# Patient Record
Sex: Female | Born: 1975
Health system: Southern US, Community
[De-identification: ages and names within clinical notes are randomized; demographics above are authoritative.]

## PROBLEM LIST (undated history)

## (undated) DIAGNOSIS — IMO0001 Reserved for inherently not codable concepts without codable children: Secondary | ICD-10-CM

## (undated) DIAGNOSIS — A749 Chlamydial infection, unspecified: Secondary | ICD-10-CM

## (undated) DIAGNOSIS — B009 Herpesviral infection, unspecified: Secondary | ICD-10-CM

## (undated) DIAGNOSIS — IMO0002 Reserved for concepts with insufficient information to code with codable children: Secondary | ICD-10-CM

## (undated) DIAGNOSIS — B002 Herpesviral gingivostomatitis and pharyngotonsillitis: Secondary | ICD-10-CM

## (undated) DIAGNOSIS — Z9289 Personal history of other medical treatment: Secondary | ICD-10-CM

## (undated) DIAGNOSIS — M1990 Unspecified osteoarthritis, unspecified site: Secondary | ICD-10-CM

## (undated) DIAGNOSIS — D1803 Hemangioma of intra-abdominal structures: Secondary | ICD-10-CM

## (undated) DIAGNOSIS — E669 Obesity, unspecified: Secondary | ICD-10-CM

## (undated) DIAGNOSIS — R7301 Impaired fasting glucose: Secondary | ICD-10-CM

## (undated) DIAGNOSIS — I10 Essential (primary) hypertension: Secondary | ICD-10-CM

## (undated) DIAGNOSIS — M549 Dorsalgia, unspecified: Secondary | ICD-10-CM

## (undated) HISTORY — DX: Hemangioma of intra-abdominal structures: D18.03

## (undated) HISTORY — DX: Obesity, unspecified: E66.9

## (undated) HISTORY — DX: Chlamydial infection, unspecified: A74.9

## (undated) HISTORY — DX: Dorsalgia, unspecified: M54.9

## (undated) HISTORY — DX: Personal history of other medical treatment: Z92.89

## (undated) HISTORY — DX: Reserved for inherently not codable concepts without codable children: IMO0001

## (undated) HISTORY — DX: Herpesviral gingivostomatitis and pharyngotonsillitis: B00.2

## (undated) HISTORY — DX: Essential (primary) hypertension: I10

## (undated) HISTORY — DX: Unspecified osteoarthritis, unspecified site: M19.90

## (undated) HISTORY — DX: Herpesviral infection, unspecified: B00.9

## (undated) HISTORY — DX: Impaired fasting glucose: R73.01

---

## 1998-03-20 HISTORY — PX: KNEE SURGERY: SHX244

## 2003-03-21 DIAGNOSIS — R87619 Unspecified abnormal cytological findings in specimens from cervix uteri: Secondary | ICD-10-CM

## 2003-03-21 DIAGNOSIS — IMO0002 Reserved for concepts with insufficient information to code with codable children: Secondary | ICD-10-CM

## 2003-03-21 HISTORY — DX: Reserved for concepts with insufficient information to code with codable children: IMO0002

## 2003-03-21 HISTORY — DX: Unspecified abnormal cytological findings in specimens from cervix uteri: R87.619

## 2003-05-19 HISTORY — PX: COLPOSCOPY: SHX161

## 2004-08-05 ENCOUNTER — Other Ambulatory Visit: Admission: RE | Admit: 2004-08-05 | Discharge: 2004-08-05 | Payer: Self-pay | Admitting: Family Medicine

## 2005-08-24 ENCOUNTER — Ambulatory Visit: Payer: Self-pay | Admitting: Family Medicine

## 2005-08-24 ENCOUNTER — Other Ambulatory Visit: Admission: RE | Admit: 2005-08-24 | Discharge: 2005-08-24 | Payer: Self-pay | Admitting: Family Medicine

## 2005-09-28 ENCOUNTER — Ambulatory Visit: Payer: Self-pay | Admitting: Family Medicine

## 2005-11-14 ENCOUNTER — Ambulatory Visit: Payer: Self-pay | Admitting: Family Medicine

## 2006-03-20 DIAGNOSIS — D1803 Hemangioma of intra-abdominal structures: Secondary | ICD-10-CM

## 2006-03-20 HISTORY — DX: Hemangioma of intra-abdominal structures: D18.03

## 2006-06-19 ENCOUNTER — Ambulatory Visit: Payer: Self-pay | Admitting: Family Medicine

## 2006-07-18 ENCOUNTER — Ambulatory Visit: Payer: Self-pay | Admitting: Family Medicine

## 2006-07-19 ENCOUNTER — Ambulatory Visit (HOSPITAL_COMMUNITY): Admission: RE | Admit: 2006-07-19 | Discharge: 2006-07-19 | Payer: Self-pay | Admitting: Family Medicine

## 2006-12-28 ENCOUNTER — Ambulatory Visit: Payer: Self-pay | Admitting: Family Medicine

## 2006-12-28 ENCOUNTER — Other Ambulatory Visit: Admission: RE | Admit: 2006-12-28 | Discharge: 2006-12-28 | Payer: Self-pay | Admitting: Family Medicine

## 2007-02-12 ENCOUNTER — Ambulatory Visit: Payer: Self-pay | Admitting: Family Medicine

## 2008-01-06 ENCOUNTER — Other Ambulatory Visit: Admission: RE | Admit: 2008-01-06 | Discharge: 2008-01-06 | Payer: Self-pay | Admitting: Family Medicine

## 2008-01-06 ENCOUNTER — Ambulatory Visit: Payer: Self-pay | Admitting: Family Medicine

## 2008-02-10 ENCOUNTER — Ambulatory Visit: Payer: Self-pay | Admitting: Family Medicine

## 2008-04-01 ENCOUNTER — Ambulatory Visit: Payer: Self-pay | Admitting: Family Medicine

## 2009-02-09 ENCOUNTER — Ambulatory Visit: Payer: Self-pay | Admitting: Family Medicine

## 2009-02-09 DIAGNOSIS — Z9289 Personal history of other medical treatment: Secondary | ICD-10-CM

## 2009-02-09 HISTORY — DX: Personal history of other medical treatment: Z92.89

## 2009-03-09 ENCOUNTER — Ambulatory Visit: Payer: Self-pay | Admitting: Family Medicine

## 2009-03-09 ENCOUNTER — Other Ambulatory Visit: Admission: RE | Admit: 2009-03-09 | Discharge: 2009-03-09 | Payer: Self-pay | Admitting: Family Medicine

## 2009-03-09 LAB — HM PAP SMEAR: HM Pap smear: NEGATIVE

## 2009-09-07 ENCOUNTER — Ambulatory Visit: Payer: Self-pay | Admitting: Family Medicine

## 2010-10-11 LAB — RPR: RPR: NONREACTIVE

## 2010-10-11 LAB — RUBELLA ANTIBODY, IGM: Rubella: IMMUNE

## 2010-10-26 ENCOUNTER — Other Ambulatory Visit (HOSPITAL_COMMUNITY): Payer: Self-pay | Admitting: Obstetrics

## 2010-10-28 ENCOUNTER — Other Ambulatory Visit (HOSPITAL_COMMUNITY): Payer: Self-pay | Admitting: Obstetrics

## 2010-10-28 ENCOUNTER — Ambulatory Visit (HOSPITAL_COMMUNITY)
Admission: RE | Admit: 2010-10-28 | Discharge: 2010-10-28 | Disposition: A | Discharge status: Home / Self Care | Payer: Medicaid Other | Source: Ambulatory Visit | Attending: Obstetrics | Admitting: Obstetrics

## 2010-10-28 DIAGNOSIS — Z3689 Encounter for other specified antenatal screening: Secondary | ICD-10-CM | POA: Insufficient documentation

## 2010-10-28 DIAGNOSIS — O3660X0 Maternal care for excessive fetal growth, unspecified trimester, not applicable or unspecified: Secondary | ICD-10-CM | POA: Insufficient documentation

## 2010-12-06 ENCOUNTER — Other Ambulatory Visit (HOSPITAL_COMMUNITY): Payer: Self-pay | Admitting: Obstetrics

## 2010-12-06 DIAGNOSIS — Z3689 Encounter for other specified antenatal screening: Secondary | ICD-10-CM

## 2010-12-09 ENCOUNTER — Ambulatory Visit (HOSPITAL_COMMUNITY)
Admission: RE | Admit: 2010-12-09 | Discharge: 2010-12-09 | Disposition: A | Discharge status: Home / Self Care | Payer: Medicaid Other | Source: Ambulatory Visit | Attending: Obstetrics | Admitting: Obstetrics

## 2010-12-09 DIAGNOSIS — O358XX0 Maternal care for other (suspected) fetal abnormality and damage, not applicable or unspecified: Secondary | ICD-10-CM | POA: Insufficient documentation

## 2010-12-09 DIAGNOSIS — Z363 Encounter for antenatal screening for malformations: Secondary | ICD-10-CM | POA: Insufficient documentation

## 2010-12-09 DIAGNOSIS — E669 Obesity, unspecified: Secondary | ICD-10-CM | POA: Insufficient documentation

## 2010-12-09 DIAGNOSIS — Z1389 Encounter for screening for other disorder: Secondary | ICD-10-CM | POA: Insufficient documentation

## 2010-12-09 DIAGNOSIS — Z3689 Encounter for other specified antenatal screening: Secondary | ICD-10-CM

## 2010-12-16 ENCOUNTER — Other Ambulatory Visit (HOSPITAL_COMMUNITY): Payer: Self-pay | Admitting: Obstetrics

## 2010-12-16 DIAGNOSIS — Z0489 Encounter for examination and observation for other specified reasons: Secondary | ICD-10-CM

## 2010-12-16 DIAGNOSIS — IMO0002 Reserved for concepts with insufficient information to code with codable children: Secondary | ICD-10-CM

## 2011-01-06 ENCOUNTER — Ambulatory Visit (HOSPITAL_COMMUNITY)
Admission: RE | Admit: 2011-01-06 | Discharge: 2011-01-06 | Disposition: A | Discharge status: Home / Self Care | Payer: Medicaid Other | Source: Ambulatory Visit | Attending: Obstetrics | Admitting: Obstetrics

## 2011-01-06 DIAGNOSIS — E669 Obesity, unspecified: Secondary | ICD-10-CM | POA: Insufficient documentation

## 2011-01-06 DIAGNOSIS — Z3689 Encounter for other specified antenatal screening: Secondary | ICD-10-CM | POA: Insufficient documentation

## 2011-01-06 DIAGNOSIS — IMO0002 Reserved for concepts with insufficient information to code with codable children: Secondary | ICD-10-CM

## 2011-01-06 DIAGNOSIS — Z0489 Encounter for examination and observation for other specified reasons: Secondary | ICD-10-CM

## 2011-03-21 NOTE — L&D Delivery Note (Signed)
Delivery Note At 12:44 PM a viable female was delivered via Vaginal, Vacuum (Extractor) (Presentation: Left Occiput Anterior).  APGAR: 8, 9; weight 6 lb 12.5 oz (3076 g).   Placenta status: Intact, Manual removal.  Cord: 3 vessels with the following complications: None.  Cord pH: not done  Anesthesia: Epidural  Episiotomy: None Lacerations: None Suture Repair: 2.0 Est. Blood Loss (mL): 200  Mom to postpartum.  Baby to nursery-stable.  Lorry Anastasi A 05/03/2011, 1:15 PM

## 2011-03-30 LAB — STREP B DNA PROBE: GBS: POSITIVE

## 2011-03-31 ENCOUNTER — Other Ambulatory Visit (HOSPITAL_COMMUNITY): Payer: Self-pay | Admitting: Obstetrics

## 2011-03-31 DIAGNOSIS — O2690 Pregnancy related conditions, unspecified, unspecified trimester: Secondary | ICD-10-CM

## 2011-04-05 ENCOUNTER — Encounter (HOSPITAL_COMMUNITY): Payer: Self-pay

## 2011-04-05 ENCOUNTER — Ambulatory Visit (HOSPITAL_COMMUNITY)
Admission: RE | Admit: 2011-04-05 | Discharge: 2011-04-05 | Disposition: A | Discharge status: Home / Self Care | Payer: Medicaid Other | Source: Ambulatory Visit | Attending: Obstetrics | Admitting: Obstetrics

## 2011-04-05 DIAGNOSIS — E669 Obesity, unspecified: Secondary | ICD-10-CM | POA: Insufficient documentation

## 2011-04-05 DIAGNOSIS — O2690 Pregnancy related conditions, unspecified, unspecified trimester: Secondary | ICD-10-CM

## 2011-04-05 DIAGNOSIS — Z3689 Encounter for other specified antenatal screening: Secondary | ICD-10-CM | POA: Insufficient documentation

## 2011-04-05 NOTE — Progress Notes (Signed)
Joanne Johns was seen for ultrasound appointment today.  Please see [facility] report for details.

## 2011-04-27 ENCOUNTER — Encounter (HOSPITAL_COMMUNITY): Payer: Self-pay | Admitting: *Deleted

## 2011-04-27 ENCOUNTER — Telehealth (HOSPITAL_COMMUNITY): Payer: Self-pay | Admitting: *Deleted

## 2011-04-27 NOTE — Telephone Encounter (Signed)
Preadmission screen  

## 2011-05-03 ENCOUNTER — Encounter (HOSPITAL_COMMUNITY): Payer: Self-pay | Admitting: Anesthesiology

## 2011-05-03 ENCOUNTER — Encounter (HOSPITAL_COMMUNITY): Payer: Self-pay | Admitting: *Deleted

## 2011-05-03 ENCOUNTER — Inpatient Hospital Stay (HOSPITAL_COMMUNITY)
Admission: AD | Admit: 2011-05-03 | Discharge: 2011-05-05 | DRG: 774 | Disposition: A | Discharge status: Home / Self Care | Payer: Medicaid Other | Source: Ambulatory Visit | Attending: Obstetrics | Admitting: Obstetrics

## 2011-05-03 ENCOUNTER — Inpatient Hospital Stay (HOSPITAL_COMMUNITY): Payer: Medicaid Other | Admitting: Anesthesiology

## 2011-05-03 DIAGNOSIS — Z22330 Carrier of Group B streptococcus: Secondary | ICD-10-CM

## 2011-05-03 DIAGNOSIS — O99892 Other specified diseases and conditions complicating childbirth: Secondary | ICD-10-CM | POA: Diagnosis present

## 2011-05-03 DIAGNOSIS — O9279 Other disorders of lactation: Secondary | ICD-10-CM | POA: Diagnosis not present

## 2011-05-03 HISTORY — DX: Reserved for concepts with insufficient information to code with codable children: IMO0002

## 2011-05-03 LAB — CBC
MCH: 30.6 pg (ref 26.0–34.0)
MCV: 91.7 fL (ref 78.0–100.0)
Platelets: 204 10*3/uL (ref 150–400)
RDW: 14.4 % (ref 11.5–15.5)

## 2011-05-03 MED ORDER — LACTATED RINGERS IV SOLN: 500.0000 mL | INTRAVENOUS | Status: DC | PRN

## 2011-05-03 MED ORDER — ONDANSETRON HCL 4 MG PO TABS: 4.0000 mg | ORAL_TABLET | ORAL | Status: DC | PRN

## 2011-05-03 MED ORDER — LIDOCAINE HCL (PF) 1 % IJ SOLN
30.0000 mL | INTRAMUSCULAR | Status: DC | PRN
  Filled 2011-05-03: qty 30

## 2011-05-03 MED ORDER — BENZOCAINE-MENTHOL 20-0.5 % EX AERO
INHALATION_SPRAY | CUTANEOUS | Status: AC
  Administered 2011-05-03: 1 via TOPICAL
  Filled 2011-05-03: qty 56

## 2011-05-03 MED ORDER — PRENATAL MULTIVITAMIN CH
1.0000 | ORAL_TABLET | Freq: Every day | ORAL | Status: DC
  Administered 2011-05-04 – 2011-05-05 (×2): 1 via ORAL
  Filled 2011-05-03 (×2): qty 1

## 2011-05-03 MED ORDER — EPHEDRINE 5 MG/ML INJ
10.0000 mg | INTRAVENOUS | Status: DC | PRN
  Filled 2011-05-03: qty 4

## 2011-05-03 MED ORDER — OXYCODONE-ACETAMINOPHEN 5-325 MG PO TABS
1.0000 | ORAL_TABLET | ORAL | Status: DC | PRN
  Filled 2011-05-03: qty 1

## 2011-05-03 MED ORDER — FERROUS SULFATE 325 (65 FE) MG PO TABS
325.0000 mg | ORAL_TABLET | Freq: Two times a day (BID) | ORAL | Status: DC
  Administered 2011-05-03 – 2011-05-05 (×4): 325 mg via ORAL
  Filled 2011-05-03 (×4): qty 1

## 2011-05-03 MED ORDER — DIPHENHYDRAMINE HCL 50 MG/ML IJ SOLN: 12.5000 mg | INTRAMUSCULAR | Status: DC | PRN

## 2011-05-03 MED ORDER — SIMETHICONE 80 MG PO CHEW: 80.0000 mg | CHEWABLE_TABLET | ORAL | Status: DC | PRN

## 2011-05-03 MED ORDER — LACTATED RINGERS IV SOLN: 500.0000 mL | Freq: Once | INTRAVENOUS | Status: DC

## 2011-05-03 MED ORDER — SENNOSIDES-DOCUSATE SODIUM 8.6-50 MG PO TABS
2.0000 | ORAL_TABLET | Freq: Every day | ORAL | Status: DC
  Administered 2011-05-03 – 2011-05-04 (×2): 2 via ORAL

## 2011-05-03 MED ORDER — FENTANYL 2.5 MCG/ML BUPIVACAINE 1/10 % EPIDURAL INFUSION (WH - ANES)
14.0000 mL/h | INTRAMUSCULAR | Status: DC
  Filled 2011-05-03: qty 60

## 2011-05-03 MED ORDER — ZOLPIDEM TARTRATE 5 MG PO TABS: 5.0000 mg | ORAL_TABLET | Freq: Every evening | ORAL | Status: DC | PRN

## 2011-05-03 MED ORDER — LIDOCAINE HCL 1.5 % IJ SOLN
INTRAMUSCULAR | Status: DC | PRN
  Administered 2011-05-03 (×2): 5 mL via EPIDURAL

## 2011-05-03 MED ORDER — EPHEDRINE 5 MG/ML INJ: 10.0000 mg | INTRAVENOUS | Status: DC | PRN

## 2011-05-03 MED ORDER — ONDANSETRON HCL 4 MG/2ML IJ SOLN: 4.0000 mg | Freq: Four times a day (QID) | INTRAMUSCULAR | Status: DC | PRN

## 2011-05-03 MED ORDER — LACTATED RINGERS IV SOLN
INTRAVENOUS | Status: DC
  Administered 2011-05-03: 125 mL/h via INTRAVENOUS
  Administered 2011-05-03: 11:00:00 via INTRAVENOUS

## 2011-05-03 MED ORDER — BUTORPHANOL TARTRATE 2 MG/ML IJ SOLN: 1.0000 mg | INTRAMUSCULAR | Status: DC | PRN

## 2011-05-03 MED ORDER — FLEET ENEMA 7-19 GM/118ML RE ENEM: 1.0000 | ENEMA | RECTAL | Status: DC | PRN

## 2011-05-03 MED ORDER — TETANUS-DIPHTH-ACELL PERTUSSIS 5-2.5-18.5 LF-MCG/0.5 IM SUSP
0.5000 mL | Freq: Once | INTRAMUSCULAR | Status: AC
  Administered 2011-05-04: 0.5 mL via INTRAMUSCULAR
  Filled 2011-05-03: qty 0.5

## 2011-05-03 MED ORDER — ACETAMINOPHEN 325 MG PO TABS: 650.0000 mg | ORAL_TABLET | ORAL | Status: DC | PRN

## 2011-05-03 MED ORDER — CITRIC ACID-SODIUM CITRATE 334-500 MG/5ML PO SOLN: 30.0000 mL | ORAL | Status: DC | PRN

## 2011-05-03 MED ORDER — IBUPROFEN 600 MG PO TABS: 600.0000 mg | ORAL_TABLET | Freq: Four times a day (QID) | ORAL | Status: DC | PRN

## 2011-05-03 MED ORDER — BENZOCAINE-MENTHOL 20-0.5 % EX AERO
1.0000 "application " | INHALATION_SPRAY | CUTANEOUS | Status: DC | PRN
  Administered 2011-05-03: 1 via TOPICAL

## 2011-05-03 MED ORDER — OXYTOCIN 20 UNITS IN LACTATED RINGERS INFUSION - SIMPLE
125.0000 mL/h | Freq: Once | INTRAVENOUS | Status: AC
  Administered 2011-05-03: 999 mL/h via INTRAVENOUS

## 2011-05-03 MED ORDER — PHENYLEPHRINE 40 MCG/ML (10ML) SYRINGE FOR IV PUSH (FOR BLOOD PRESSURE SUPPORT)
80.0000 ug | PREFILLED_SYRINGE | INTRAVENOUS | Status: DC | PRN
  Administered 2011-05-03: 80 ug via INTRAVENOUS
  Filled 2011-05-03: qty 5

## 2011-05-03 MED ORDER — PENICILLIN G POTASSIUM 5000000 UNITS IJ SOLR
2.5000 10*6.[IU] | INTRAVENOUS | Status: DC
  Filled 2011-05-03 (×5): qty 2.5

## 2011-05-03 MED ORDER — FENTANYL 2.5 MCG/ML BUPIVACAINE 1/10 % EPIDURAL INFUSION (WH - ANES)
INTRAMUSCULAR | Status: DC | PRN
  Administered 2011-05-03: 14 mL/h via EPIDURAL

## 2011-05-03 MED ORDER — PHENYLEPHRINE 40 MCG/ML (10ML) SYRINGE FOR IV PUSH (FOR BLOOD PRESSURE SUPPORT): 80.0000 ug | PREFILLED_SYRINGE | INTRAVENOUS | Status: DC | PRN

## 2011-05-03 MED ORDER — DIBUCAINE 1 % RE OINT: 1.0000 "application " | TOPICAL_OINTMENT | RECTAL | Status: DC | PRN

## 2011-05-03 MED ORDER — DEXTROSE 5 % IV SOLN
5.0000 10*6.[IU] | Freq: Once | INTRAVENOUS | Status: AC
  Administered 2011-05-03: 5 10*6.[IU] via INTRAVENOUS
  Filled 2011-05-03: qty 5

## 2011-05-03 MED ORDER — ONDANSETRON HCL 4 MG/2ML IJ SOLN: 4.0000 mg | INTRAMUSCULAR | Status: DC | PRN

## 2011-05-03 MED ORDER — IBUPROFEN 600 MG PO TABS
600.0000 mg | ORAL_TABLET | Freq: Four times a day (QID) | ORAL | Status: DC
  Administered 2011-05-03 – 2011-05-05 (×7): 600 mg via ORAL
  Filled 2011-05-03 (×7): qty 1

## 2011-05-03 MED ORDER — WITCH HAZEL-GLYCERIN EX PADS: 1.0000 "application " | MEDICATED_PAD | CUTANEOUS | Status: DC | PRN

## 2011-05-03 MED ORDER — LANOLIN HYDROUS EX OINT: TOPICAL_OINTMENT | CUTANEOUS | Status: DC | PRN

## 2011-05-03 MED ORDER — DIPHENHYDRAMINE HCL 25 MG PO CAPS: 25.0000 mg | ORAL_CAPSULE | Freq: Four times a day (QID) | ORAL | Status: DC | PRN

## 2011-05-03 MED ORDER — OXYCODONE-ACETAMINOPHEN 5-325 MG PO TABS
1.0000 | ORAL_TABLET | ORAL | Status: DC | PRN
  Administered 2011-05-03 – 2011-05-04 (×3): 1 via ORAL
  Filled 2011-05-03 (×2): qty 1

## 2011-05-03 MED ORDER — OXYTOCIN BOLUS FROM INFUSION
500.0000 mL | Freq: Once | INTRAVENOUS | Status: DC
  Filled 2011-05-03: qty 1000
  Filled 2011-05-03: qty 500

## 2011-05-03 NOTE — Anesthesia Procedure Notes (Signed)
Epidural Patient location during procedure: OB Start time: 05/03/2011 10:12 AM End time: 05/03/2011 10:17 AM Reason for block: procedure for pain  Staffing Anesthesiologist: Sandrea Hughs Performed by: anesthesiologist   Preanesthetic Checklist Completed: patient identified, site marked, surgical consent, pre-op evaluation, timeout performed, IV checked, risks and benefits discussed and monitors and equipment checked  Epidural Patient position: sitting Prep: site prepped and draped and DuraPrep Patient monitoring: continuous pulse ox and blood pressure Approach: midline Injection technique: LOR air  Needle:  Needle type: Tuohy  Needle gauge: 17 G Needle length: 9 cm Needle insertion depth: 7 cm Catheter type: closed end flexible Catheter size: 19 Gauge Catheter at skin depth: 13 cm Test dose: negative and 1.5% lidocaine  Assessment Sensory level: T8 Events: blood not aspirated, injection not painful, no injection resistance, negative IV test and no paresthesia

## 2011-05-03 NOTE — Anesthesia Preprocedure Evaluation (Signed)
Anesthesia Evaluation  Patient identified by MRN, date of birth, ID band Patient awake    Reviewed: Allergy & Precautions, H&P , NPO status , Patient's Chart, lab work & pertinent test results  Airway Mallampati: II TM Distance: >3 FB Neck ROM: full    Dental No notable dental hx.    Pulmonary neg pulmonary ROS,    Pulmonary exam normal       Cardiovascular neg cardio ROS     Neuro/Psych Negative Neurological ROS  Negative Psych ROS   GI/Hepatic negative GI ROS, Neg liver ROS,   Endo/Other  Morbid obesity  Renal/GU negative Renal ROS  Genitourinary negative   Musculoskeletal negative musculoskeletal ROS (+)   Abdominal (+) obese,   Peds negative pediatric ROS (+)  Hematology negative hematology ROS (+)   Anesthesia Other Findings   Reproductive/Obstetrics (+) Pregnancy                           Anesthesia Physical Anesthesia Plan  ASA: III  Anesthesia Plan: Epidural   Post-op Pain Management:    Induction:   Airway Management Planned:   Additional Equipment:   Intra-op Plan:   Post-operative Plan:   Informed Consent: I have reviewed the patients History and Physical, chart, labs and discussed the procedure including the risks, benefits and alternatives for the proposed anesthesia with the patient or authorized representative who has indicated his/her understanding and acceptance.     Plan Discussed with:   Anesthesia Plan Comments:         Anesthesia Quick Evaluation  

## 2011-05-03 NOTE — Progress Notes (Signed)
Dr. Gaynell Face notified of pt status, SVE, FHR, prolonged deceleration, RN interventions, oxygen applied, pt repositioned, phenylephrine given, pt's BPs, epidural placement, and UC pattern.  Will continue to monitor.

## 2011-05-03 NOTE — Progress Notes (Signed)
Dr. Gaynell Face reviewing FHR tracing and notified of pt status

## 2011-05-03 NOTE — Progress Notes (Signed)
UC's since 0600

## 2011-05-03 NOTE — Progress Notes (Signed)
Dr. Gaynell Face notified of pt status, SVE, FHR, prolonged deceleration, IFSE applied, oxygen running, pt repositioned, UC pattern, and pt denies pain.  Dr. Gaynell Face stated he would be in soon to assess pt and review FHR tracing.

## 2011-05-03 NOTE — Progress Notes (Signed)
1042 FHR decreased to 70-90 bpm for 7-8 minutes, then gradually increased back to baseline.  Dr. Gaynell Face notified.

## 2011-05-03 NOTE — Progress Notes (Signed)
Pt denies feeling nausea, dizzy, and light-headed, will continue to monitor.

## 2011-05-03 NOTE — Progress Notes (Signed)
Brought from lobby to room #[id number]

## 2011-05-03 NOTE — H&P (Signed)
Is in this is Dr. Francoise Ceo dictating the history and physical on  Joanne Johns she's a 36 year old gravida 07/18/1928 at 40 weeks in in 3 days her EDC is to 10:30 positive GBS and received penicillin and she was 4 cm dilated 100% with the vertex is 1 station her membranes were ruptured artificially at 11:15 fluid was clear patient made rapid progress and became 9 cm -1 station and was having prolonged bearable so the cervix was reduced vacuum applied at +3 station she had a normal vaginal delivery of a female Apgar 8 and 9 the placenta was removed manually there were no lacerations Past medical history negative Surgical history negative Social history negative System review noncontributory Physical exam well-developed female post delivery HEENT negative Lungs clear Heart regular rhythm no murmurs no gallops Breasts engorged Uterus 20 week size Extremities negative and and

## 2011-05-03 NOTE — Progress Notes (Signed)
1212 FHR decreased to 70-90 for 4-5 minutes then gradually returned to baseline.  Dr. Gaynell Face notified.

## 2011-05-03 NOTE — Anesthesia Postprocedure Evaluation (Signed)
Anesthesia Post Note  Patient: Joanne Johns  Procedure(s) Performed: * No procedures listed *  Anesthesia type: Epidural  Patient location: Mother/Baby  Post pain: Pain level controlled  Post assessment: Post-op Vital signs reviewed  Last Vitals:  Filed Vitals:   05/03/11 1500  BP: 135/72  Pulse: 110  Temp: 36.8 C  Resp: 18    Post vital signs: Reviewed  Level of consciousness: awake  Complications: No apparent anesthesia complications

## 2011-05-04 ENCOUNTER — Inpatient Hospital Stay (HOSPITAL_COMMUNITY): Admission: RE | Admit: 2011-05-04 | Payer: Medicaid Other | Source: Ambulatory Visit

## 2011-05-04 LAB — CBC
HCT: 32.8 % — ABNORMAL LOW (ref 36.0–46.0)
Platelets: 182 10*3/uL (ref 150–400)
RDW: 14.5 % (ref 11.5–15.5)
WBC: 14.1 10*3/uL — ABNORMAL HIGH (ref 4.0–10.5)

## 2011-05-04 NOTE — Anesthesia Postprocedure Evaluation (Signed)
  Anesthesia Post-op Note  Patient: Joanne Johns  Procedure(s) Performed: * No procedures listed *  Patient Location: Mother/Baby  Anesthesia Type: Epidural  Level of Consciousness: awake  Airway and Oxygen Therapy: Patient Spontanous Breathing  Post-op Pain: none  Post-op Assessment: Patient's Cardiovascular Status Stable, RESPIRATORY FUNCTION UNSTABLE, Adequate PO intake and Pain level controlled  Post-op Vital Signs: Reviewed and stable  Complications: No apparent anesthesia complications

## 2011-05-04 NOTE — Addendum Note (Signed)
Addendum  created 05/04/11 0957 by Suella Grove, CRNA   Modules edited:Charges VN, Notes Section

## 2011-05-04 NOTE — Addendum Note (Signed)
Addendum  created 05/04/11 0957 by Charmin Aguiniga C Roxan Yamamoto, CRNA   Modules edited:Charges VN, Notes Section    

## 2011-05-04 NOTE — Progress Notes (Signed)
Patient ID: Joanne Johns, female   DOB: 06-11-1975, 36 y.o.   MRN: [id number] Postpartum day one Fundus firm Lochia moderate Legs negative No complaints

## 2011-05-05 NOTE — Discharge Summary (Signed)
Obstetric Discharge Summary Reason for Admission: onset of labor Prenatal Procedures: none Intrapartum Procedures: spontaneous vaginal delivery Postpartum Procedures: none Complications-Operative and Postpartum: none Hemoglobin  Date Value Range Status  05/04/2011 10.7* 12.0-15.0 (g/dL) Final     HCT  Date Value Range Status  05/04/2011 32.8* 36.0-46.0 (%) Final    Discharge Diagnoses: Term Pregnancy-delivered  Discharge Information: Date: 05/05/2011 Activity: pelvic rest Diet: routine Medications: Percocet Condition: stable Instructions: refer to practice specific booklet Discharge to: home Follow-up Information    Follow up with Maha Fischel A, MD. Call in 6 weeks.   Contact information:   [address] [place] [address] [phone]          Newborn Data: Live born female  Birth Weight: 6 lb 12.5 oz (3076 g) APGAR: 8, 9  Home with mother.  Shaylah Mcghie A 05/05/2011, 6:51 AM

## 2011-05-05 NOTE — Progress Notes (Signed)
UR Chart review completed.  

## 2011-05-05 NOTE — Discharge Instructions (Signed)
Discharge instructions   You can wash your hair  Shower  Eat what you want  Drink what you want  See me in 6 weeks  Your ankles are going to swell more in the next 2 weeks than when pregnant  No sex for 6 weeks   Adelaido Nicklaus A, MD 05/05/2011

## 2011-11-13 ENCOUNTER — Encounter: Payer: Self-pay | Admitting: Internal Medicine

## 2011-11-23 ENCOUNTER — Other Ambulatory Visit (HOSPITAL_COMMUNITY)
Admission: RE | Admit: 2011-11-23 | Discharge: 2011-11-23 | Disposition: A | Discharge status: Home / Self Care | Payer: Medicaid Other | Source: Ambulatory Visit | Attending: Medical | Admitting: Medical

## 2011-11-23 ENCOUNTER — Ambulatory Visit (INDEPENDENT_AMBULATORY_CARE_PROVIDER_SITE_OTHER): Payer: Medicaid Other | Admitting: Medical

## 2011-11-23 ENCOUNTER — Encounter: Payer: Self-pay | Admitting: Medical

## 2011-11-23 ENCOUNTER — Other Ambulatory Visit: Payer: Self-pay | Admitting: Medical

## 2011-11-23 VITALS — BP 120/88 | HR 84 | Temp 98.5°F | Resp 16 | Ht 64.0 in | Wt 246.0 lb

## 2011-11-23 DIAGNOSIS — E669 Obesity, unspecified: Secondary | ICD-10-CM | POA: Insufficient documentation

## 2011-11-23 DIAGNOSIS — Z0000 Encounter for general adult medical examination without abnormal findings: Secondary | ICD-10-CM

## 2011-11-23 DIAGNOSIS — B3731 Acute candidiasis of vulva and vagina: Secondary | ICD-10-CM

## 2011-11-23 DIAGNOSIS — Z124 Encounter for screening for malignant neoplasm of cervix: Secondary | ICD-10-CM

## 2011-11-23 DIAGNOSIS — Z1151 Encounter for screening for human papillomavirus (HPV): Secondary | ICD-10-CM | POA: Insufficient documentation

## 2011-11-23 DIAGNOSIS — Z113 Encounter for screening for infections with a predominantly sexual mode of transmission: Secondary | ICD-10-CM | POA: Insufficient documentation

## 2011-11-23 DIAGNOSIS — Z01419 Encounter for gynecological examination (general) (routine) without abnormal findings: Secondary | ICD-10-CM | POA: Insufficient documentation

## 2011-11-23 DIAGNOSIS — B373 Candidiasis of vulva and vagina: Secondary | ICD-10-CM

## 2011-11-23 LAB — POCT URINALYSIS DIPSTICK
Leukocytes, UA: NEGATIVE
Urobilinogen, UA: NEGATIVE

## 2011-11-23 MED ORDER — LEVONORGEST-ETH ESTRAD 91-DAY 0.15-0.03 MG PO TABS: 1.0000 | ORAL_TABLET | Freq: Every day | ORAL | Status: DC

## 2011-11-23 MED ORDER — FLUCONAZOLE 150 MG PO TABS: ORAL_TABLET | ORAL | Status: DC

## 2011-11-23 NOTE — Patient Instructions (Signed)
Return soon for fasting labs.   I recommend exercising most days of the week using a type of exercise that they would enjoy and stick to such as walking, running, swimming, hiking, biking, aerobics, etc.  I recommend a healthy diet.    Do's:   whole grains such as whole grain pasta, rice, whole grains breads and whole grain cereals.    Eat 2-4 fruits daily  Eat beans at least once daily  Eat almonds in small quantities at least 3 days per week    If they eat meat, I recommend small portions of lean meats such as chicken, fish, and [place].  Eat as much NON corn and NON potato vegetables as they like, particularly raw or steamed  Drink several large glasses of water daily  Cautions:  Limit red meat  Limit corn and potatoes  Limit sweets, cake, pie, candy  Limit beer and alcohol  Avoid fried food, fast food, large portions  Avoid sugary drinks such as regular soda and sweet tea    Preventative Care for Adults - Female      MAINTAIN REGULAR HEALTH EXAMS:  A routine yearly physical is a good way to check in with your primary care provider about your health and preventive screening. It is also an opportunity to share updates about your health and any concerns you have, and receive a thorough all-over exam.   Most health insurance companies pay for at least some preventative services.  Check with your health plan for specific coverages.  WHAT PREVENTATIVE SERVICES DO WOMEN NEED?  Adult women should have their weight and blood pressure checked regularly.   Women age 28 and older should have their cholesterol levels checked regularly.  Women should be screened for cervical cancer with a Pap smear and pelvic exam beginning at either age 51, or 3 years after they become sexually activity.    Breast cancer screening generally begins at age 42 with a mammogram and breast exam by your primary care provider.    Beginning at age 59 and continuing to age 41, women should be  screened for colorectal cancer.  Certain people may need continued testing until age 26.  Updating vaccinations is part of preventative care.  Vaccinations help protect against diseases such as the flu.  Osteoporosis is a disease in which the bones lose minerals and strength as we age. Women ages 48 and over should discuss this with their caregivers, as should women after menopause who have other risk factors.  Lab tests are generally done as part of preventative care to screen for anemia and blood disorders, to screen for problems with the kidneys and liver, to screen for bladder problems, to check blood sugar, and to check your cholesterol level.  Preventative services generally include counseling about diet, exercise, avoiding tobacco, drugs, excessive alcohol consumption, and sexually transmitted infections.    GENERAL RECOMMENDATIONS FOR GOOD HEALTH:  Healthy diet:  Eat a variety of foods, including fruit, vegetables, animal or vegetable protein, such as meat, fish, chicken, and eggs, or beans, lentils, tofu, and grains, such as rice.  Drink plenty of water daily.  Decrease saturated fat in the diet, avoid lots of red meat, processed foods, sweets, fast foods, and fried foods.  Exercise:  Aerobic exercise helps maintain good heart health. At least 30-40 minutes of moderate-intensity exercise is recommended. For example, a brisk walk that increases your heart rate and breathing. This should be done on most days of the week.   Find a  type of exercise or a variety of exercises that you enjoy so that it becomes a part of your daily life.  Examples are running, walking, swimming, water aerobics, and biking.  For motivation and support, explore group exercise such as aerobic class, spin class, Zumba, Yoga,or  martial arts, etc.    Set exercise goals for yourself, such as a certain weight goal, walk or run in a race such as a 5k walk/run.  Speak to your primary care provider about exercise  goals.  Disease prevention:  If you smoke or chew tobacco, find out from your caregiver how to quit. It can literally save your life, no matter how long you have been a tobacco user. If you do not use tobacco, never begin.   Maintain a healthy diet and normal weight. Increased weight leads to problems with blood pressure and diabetes.   The Body Mass Index or BMI is a way of measuring how much of your body is fat. Having a BMI above 27 increases the risk of heart disease, diabetes, hypertension, stroke and other problems related to obesity. Your caregiver can help determine your BMI and based on it develop an exercise and dietary program to help you achieve or maintain this important measurement at a healthful level.  High blood pressure causes heart and blood vessel problems.  Persistent high blood pressure should be treated with medicine if weight loss and exercise do not work.   Fat and cholesterol leaves deposits in your arteries that can block them. This causes heart disease and vessel disease elsewhere in your body.  If your cholesterol is found to be high, or if you have heart disease or certain other medical conditions, then you may need to have your cholesterol monitored frequently and be treated with medication.   Ask if you should have a cardiac stress test if your history suggests this. A stress test is a test done on a treadmill that looks for heart disease. This test can find disease prior to there being a problem.  Menopause can be associated with physical symptoms and risks. Hormone replacement therapy is available to decrease these. You should talk to your caregiver about whether starting or continuing to take hormones is right for you.   Osteoporosis is a disease in which the bones lose minerals and strength as we age. This can result in serious bone fractures. Risk of osteoporosis can be identified using a bone density scan. Women ages 88 and over should discuss this with their  caregivers, as should women after menopause who have other risk factors. Ask your caregiver whether you should be taking a calcium supplement and Vitamin D, to reduce the rate of osteoporosis.   Avoid drinking alcohol in excess (more than two drinks per day).  Avoid use of street drugs. Do not share needles with anyone. Ask for professional help if you need assistance or instructions on stopping the use of alcohol, cigarettes, and/or drugs.  Brush your teeth twice a day with fluoride toothpaste, and floss once a day. Good oral hygiene prevents tooth decay and gum disease. The problems can be painful, unattractive, and can cause other health problems. Visit your dentist for a routine oral and dental check up and preventive care every 6-12 months.   Look at your skin regularly.  Use a mirror to look at your back. Notify your caregivers of changes in moles, especially if there are changes in shapes, colors, a size larger than a pencil eraser, an irregular border, or  development of new moles.  Safety:  Use seatbelts 100% of the time, whether driving or as a passenger.  Use safety devices such as hearing protection if you work in environments with loud noise or significant background noise.  Use safety glasses when doing any work that could send debris in to the eyes.  Use a helmet if you ride a bike or motorcycle.  Use appropriate safety gear for contact sports.  Talk to your caregiver about gun safety.  Use sunscreen with a SPF (or skin protection factor) of 15 or greater.  Lighter skinned people are at a greater risk of skin cancer. Don't forget to also wear sunglasses in order to protect your eyes from too much damaging sunlight. Damaging sunlight can accelerate cataract formation.   Practice safe sex. Use condoms. Condoms are used for birth control and to help reduce the spread of sexually transmitted infections (or STIs).  Some of the STIs are gonorrhea (the clap), chlamydia, syphilis, trichomonas,  herpes, HPV (human papilloma virus) and HIV (human immunodeficiency virus) which causes AIDS. The herpes, HIV and HPV are viral illnesses that have no cure. These can result in disability, cancer and death.   Keep carbon monoxide and smoke detectors in your home functioning at all times. Change the batteries every 6 months or use a model that plugs into the wall.   Vaccinations:  Stay up to date with your tetanus shots and other required immunizations. You should have a booster for tetanus every 10 years. Be sure to get your flu shot every year, since 5%-20% of the [place] population comes down with the flu. The flu vaccine changes each year, so being vaccinated once is not enough. Get your shot in the fall, before the flu season peaks.   Other vaccines to consider:  Human Papilloma Virus or HPV causes cancer of the cervix, and other infections that can be transmitted from person to person. There is a vaccine for HPV, and females should get immunized between the ages of 2 and 68. It requires a series of 3 shots.   Pneumococcal vaccine to protect against certain types of pneumonia.  This is normally recommended for adults age 100 or older.  However, adults younger than 35 years old with certain underlying conditions such as diabetes, heart or lung disease should also receive the vaccine.  Shingles vaccine to protect against Varicella Zoster if you are older than age 62, or younger than 35 years old with certain underlying illness.  Hepatitis A vaccine to protect against a form of infection of the liver by a virus acquired from food.  Hepatitis B vaccine to protect against a form of infection of the liver by a virus acquired from blood or body fluids, particularly if you work in health care.  If you plan to travel internationally, check with your local health department for specific vaccination recommendations.  Cancer Screening:  Breast cancer screening is essential to preventive care for women.  All women age 44 and older should perform a breast self-exam every month. At age 46 and older, women should have their caregiver complete a breast exam each year. Women at ages 77 and older should have a mammogram (x-ray film) of the breasts. Your caregiver can discuss how often you need mammograms.    Cervical cancer screening includes taking a Pap smear (sample of cells examined under a microscope) from the cervix (end of the uterus). It also includes testing for HPV (Human Papilloma Virus, which can cause cervical cancer).  Screening and a pelvic exam should begin at age 28, or 3 years after a woman becomes sexually active. Screening should occur every year, with a Pap smear but no HPV testing, up to age 51. After age 79, you should have a Pap smear every 3 years with HPV testing, if no HPV was found previously.   Most routine colon cancer screening begins at the age of 77. On a yearly basis, doctors may provide special easy to use take-home tests to check for hidden blood in the stool. Sigmoidoscopy or colonoscopy can detect the earliest forms of colon cancer and is life saving. These tests use a small camera at the end of a tube to directly examine the colon. Speak to your caregiver about this at age 80, when routine screening begins (and is repeated every 5 years unless early forms of pre-cancerous polyps or small growths are found).

## 2011-11-23 NOTE — Progress Notes (Signed)
Subjective:   HPI  Joanne Johns is a 36 y.o. female who presents for a complete physical.  She has been in usual state of health.  She does not exercise, eats whatever she wants.   Preventative care: Last ophthalmology visit: Last dental visit: 2 years ago Last tetanus booster: 04/2011 Flu vaccine: declines  Last medical care was childbirth in 04/2011.  OB was Dr. Francoise Ceo.  She notes hx/o 5 pregnancies, 2 live births, 3 TABs.   She is currently on Seasonique OCP.  Wants to c/t this.  Hasn't missed any doses.   She does get random spotting but no normal periods on this.    Her only main c/o today is recurrent yeast infections.  She does occasionally douche.  She taks some bubble baths.  No recent antibiotic use.     Reviewed their medical, surgical, family, social, medication, and allergy history and updated chart as appropriate.  Past Medical History  Diagnosis Date  . Abnormal Pap smear   . Obesity   . Herpes gingivostomatitis     Past Surgical History  Procedure Date  . Knee surgery 2000    R knee, ACL, MCL meniscus  . Colposcopy 05/2003    Family History  Problem Relation Age of Onset  . Anesthesia problems Neg Hx   . Heart disease Neg Hx   . Cancer Maternal Uncle     leukemia  . Stroke Maternal Grandmother   . Drug abuse Maternal Grandmother     History   Social History  . Marital Status: Single    Spouse Name: N/A    Number of Children: N/A  . Years of Education: N/A   Occupational History  . admin assistant     [employer]   Social History Main Topics  . Smoking status: Never Smoker   . Smokeless tobacco: Never Used  . Alcohol Use: No  . Drug Use: No  . Sexually Active: Yes   Other Topics Concern  . Not on file   Social History Narrative   Single, 2 children, exercise - none    Current Outpatient Prescriptions on File Prior to Visit  Medication Sig Dispense Refill  . levonorgestrel-ethinyl estradiol  (SEASONALE,INTROVALE,JOLESSA) 0.15-0.03 MG tablet Take 1 tablet by mouth daily.        No Known Allergies    Review of Systems Constitutional: -fever, -chills, -sweats, -unexpected weight change, -anorexia, -fatigue Allergy: -sneezing, -itching, -congestion Dermatology: denies changing moles, rash, lumps, new worrisome lesions ENT: -runny nose, -ear pain, -sore throat, -hoarseness, -sinus pain, -teeth pain, -tinnitus, -hearing loss, -epistaxis Cardiology:  -chest pain, -palpitations, -edema, -orthopnea, -paroxysmal nocturnal dyspnea Respiratory: -cough, -shortness of breath, -dyspnea on exertion, -wheezing, -hemoptysis Gastroenterology: -abdominal pain, -nausea, -vomiting, -diarrhea, -constipation, -blood in stool, -changes in bowel movement, -dysphagia Hematology: -bleeding or bruising problems Musculoskeletal: -arthralgias, -myalgias, -joint swelling, -back pain, -neck pain, -cramping, -gait changes Ophthalmology: -vision changes, -eye redness, -itching, -discharge Urology: -dysuria, -difficulty urinating, -hematuria, -urinary frequency, -urgency, incontinence Neurology: -headache, -weakness, -tingling, -numbness, -speech abnormality, -memory loss, -falls, -dizziness Psychology:  -depressed mood, -agitation, -sleep problems    Objective:   Physical Exam  Filed Vitals:   11/23/11 0829  BP: 120/88  Pulse: 84  Temp: 98.5 F (36.9 C)  Resp: 16    General appearance: alert, no distress, WD/WN, obese AA female  Skin: few scattered benign macules HEENT: normocephalic, conjunctiva/corneas normal, sclerae anicteric, PERRLA, EOMi, nares patent, no discharge or erythema, pharynx normal Oral cavity: MMM, tongue normal, teeth  in good repair Neck: supple, no lymphadenopathy, no thyromegaly, no masses, normal ROM, no bruits Chest: non tender, normal shape and expansion Heart: RRR, normal S1, S2, no murmurs Lungs: CTA bilaterally, no wheezes, rhonchi, or rales Abdomen: +bs, soft, non  tender, non distended, no masses, no hepatomegaly, no splenomegaly, no bruits Back: non tender, normal ROM, no scoliosis Musculoskeletal: right anterior knee with surigcal scar, otherwise upper extremities non tender, no obvious deformity, normal ROM throughout, lower extremities non tender, no obvious deformity, normal ROM throughout Extremities: no edema, no cyanosis, no clubbing Pulses: 2+ symmetric, upper and lower extremities, normal cap refill Neurological: alert, oriented x 3, CN2-12 intact, strength normal upper extremities and lower extremities, sensation normal throughout, DTRs 2+ throughout, no cerebellar signs, gait normal Psychiatric: normal affect, behavior normal, pleasant  Breast: nontender, some mild fibrocystic tissue, but no specific masses or lumps, no skin changes, no nipple discharge or inversion, no axillary lymphadenopathy Gyn: Normal external genitalia without lesions, vagina with normal mucosa, cervix without lesions, no cervical motion tenderness, mild amount of bloody/mucoid discharge, uterus and adnexa not enlarged, nontender, no masses.  Pap performed.  Exam chaperoned by nurse. Rectal: deferred  Wet prep and KOH prep: +few yeast.  No BV, no clue cells, few bacteria, some epithelial's  Assessment and Plan :    Encounter Diagnoses  Name Primary?  . Routine general medical examination at a health care facility Yes  . Screening for cervical cancer   . Screen for STD (sexually transmitted disease)   . Obesity   . Yeast vaginitis     Physical exam - discussed healthy lifestyle, diet, exercise, preventative care, vaccinations, and addressed their concerns.  Handout given.  Don't text and drive.  Discussed cancer screening guidelines.  reviewed prior lab panels from 2010, reviewed several prior pap smears that were normal.  Pap sent today.  See dentist for routine f/u.  STD screening today.  discussed safe sex.  Obesity - spent great deal of time discussing diet,  exercise, weight loss.  Yeast vaginitis - recurrent.  Advised healthy diet, avoid sugary drinks, begin daily OTC Acidophilus tablets, avoid douching, use mild soap and water for hygiene, avoid body washes, bubble baths.  Diflucan today for + yeast on KOH.    Follow-up pending labs, in a few months for other fasting labs when other insurance kicks in.

## 2011-11-24 LAB — HIV ANTIBODY (ROUTINE TESTING W REFLEX): HIV: NONREACTIVE

## 2012-06-20 ENCOUNTER — Telehealth: Payer: Self-pay | Admitting: Family Medicine

## 2012-06-20 NOTE — Telephone Encounter (Signed)
Pt called and her 86 month old baby was just dx with the flu, the pediatrician said for her to call here and see if we will give mom med for the flu also.  Please advise pt [phone].  If called in [facility] [address]  Either way advise pt please

## 2012-06-24 NOTE — Telephone Encounter (Signed)
Unfortunately I wasn't here on Thursday or Friday, and am just now seeing this message today Monday.   I apologize, but at this point, not sure what if any symptoms she has.  Please call and see if mom is having any flu like symptoms now.  If so, how many days has she been feeling sick?  Is she better, worse?   Is her baby improved?    FYI - although I try to get to messages when I'm not here, and I did look on Thursday morning in Epic and handled all messages that were in my inbox at that point, I was on the highway by early afternoon and had no access to Epic from Thursday afternoon til Sunday night.

## 2012-06-25 NOTE — Telephone Encounter (Signed)
Cell phone number doesn't work [employer] Tried to call the home number but there is no answer and no voicemail. CLS

## 2012-06-25 NOTE — Telephone Encounter (Signed)
No answer. CLS 

## 2012-06-26 NOTE — Telephone Encounter (Signed)
No answer nor was there a answering machine. CLS

## 2012-06-28 NOTE — Telephone Encounter (Signed)
I tried to call this patient again for the 4th time no answer or voicemail so I am saving this message to her chart. CLS

## 2012-07-10 ENCOUNTER — Other Ambulatory Visit: Payer: Self-pay | Admitting: Medical

## 2012-07-11 ENCOUNTER — Other Ambulatory Visit: Payer: Self-pay | Admitting: Medical

## 2012-07-11 NOTE — Telephone Encounter (Signed)
IS THIS OK 

## 2012-07-17 ENCOUNTER — Encounter: Payer: Self-pay | Admitting: Family Medicine

## 2012-07-17 ENCOUNTER — Ambulatory Visit (INDEPENDENT_AMBULATORY_CARE_PROVIDER_SITE_OTHER): Payer: Managed Care, Other (non HMO) | Admitting: Family Medicine

## 2012-07-17 VITALS — BP 124/76 | HR 76 | Ht 64.0 in | Wt 251.0 lb

## 2012-07-17 DIAGNOSIS — L293 Anogenital pruritus, unspecified: Secondary | ICD-10-CM

## 2012-07-17 DIAGNOSIS — Z113 Encounter for screening for infections with a predominantly sexual mode of transmission: Secondary | ICD-10-CM

## 2012-07-17 DIAGNOSIS — N898 Other specified noninflammatory disorders of vagina: Secondary | ICD-10-CM

## 2012-07-17 LAB — POCT WET PREP (WET MOUNT): Clue Cells Wet Prep Whiff POC: NEGATIVE

## 2012-07-17 NOTE — Progress Notes (Signed)
Chief Complaint  Patient presents with  . Vaginal Itching    itching and irritation, no discharge other than normal. Shane called in difulcan last Friday, did not work completely. Thinknng maybe she needs an STD check.   First began with symptoms 1 week ago.  She had internal/vaginal itching.  Discharge is her usual--no change.  No odor, discoloration, just small amount of thing white discharge.  She called and took diflucan 5 days ago.  She feels like it helped some, "took the edge off".  She took the second dose yesterday, doesn't notice much difference since yesterday.    She used a new feminine wash earlier last week (x 2 days, none since the itching started).  She may have used it in the past, but infrequently.  No other new products.  She is in a sexual relationship with just one partner.  She believes it is monogamous, but would like STD check.    Past Medical History  Diagnosis Date  . Abnormal Pap smear   . Obesity   . Herpes gingivostomatitis    Past Surgical History  Procedure Laterality Date  . Knee surgery  2000    R knee, ACL, MCL meniscus  . Colposcopy  05/2003   History   Social History  . Marital Status: Single    Spouse Name: N/A    Number of Children: N/A  . Years of Education: N/A   Occupational History  . admin assistant     [employer]   Social History Main Topics  . Smoking status: Never Smoker   . Smokeless tobacco: Never Used  . Alcohol Use: No  . Drug Use: No  . Sexually Active: Yes -- Female partner(s)    Birth Control/ Protection: Pill   Other Topics Concern  . Not on file   Social History Narrative   Single, 2 children, exercise - none   Current Outpatient Prescriptions on File Prior to Visit  Medication Sig Dispense Refill  . levonorgestrel-ethinyl estradiol (SEASONALE,INTROVALE,JOLESSA) 0.15-0.03 MG tablet Take 1 tablet by mouth daily.  1 Package  11  . fluconazole (DIFLUCAN) 150 MG tablet ONE NOW AND CAN REPEAT IN 1 WK  2 tablet   0   No current facility-administered medications on file prior to visit.   No Known Allergies  ROS:  Denies fevers, dysuria.  She has some slight lower abdominal pain, but she is due for her period (she sometimes gets slight cramping, usually not much bleeding).  Denies URI symptoms, bleeding, bruising or other concerns  PHYSICAL EXAM: BP 124/76  Pulse 76  Ht 5\' 4"  (1.626 m)  Wt 251 lb (113.853 kg)  BMI 43.06 kg/m2  Breastfeeding? No Well developed, pleasant obese female in no distress Heart: regular rate and rhythm Lungs: clear Abdomen: nontender, soft External genitalia: normal without lesions, rashes.  Very small amount of clear discharge with small amount of thin white intermixed.  Cervix appears normal without lesions, erythema  ASSESSMENT/PLAN: Vaginal itching - Plan: POCT Wet Prep [employer] [place])  Screen for STD (sexually transmitted disease) - Plan: HIV Antibody, RPR, Hepatitis B surface antigen, POCT Wet Prep (Wet [place]), GC/Chlamydia Probe Amp   HIV, RPR, GC, chlamydia Wet prep--no clue cells, no trich, no hyphae or yeast seen  Possibly partially treated yeast infection--if so, may continue to improve over the next few days, due to yesterday's dose of diflucan. If she has ongoing itching and irritation, can consider trial of vagisil for symptomatic relief.

## 2012-07-17 NOTE — Patient Instructions (Addendum)
Wait a few days.  If you continue to have itching and irritation, you can try Vagisil (not the kind for yeast).  I didn't see any evidence of yeast infection today--may be due to you having taken 2 doses of medication.

## 2012-07-18 ENCOUNTER — Encounter: Payer: Self-pay | Admitting: Family Medicine

## 2012-07-18 ENCOUNTER — Encounter: Payer: Self-pay | Admitting: Medical

## 2012-07-18 LAB — GC/CHLAMYDIA PROBE AMP: GC Probe RNA: NEGATIVE

## 2013-01-10 ENCOUNTER — Other Ambulatory Visit: Payer: Self-pay | Admitting: Medical

## 2013-01-13 ENCOUNTER — Telehealth: Payer: Self-pay | Admitting: Medical

## 2013-01-13 MED ORDER — LEVONORGEST-ETH ESTRAD 91-DAY 0.15-0.03 MG PO TABS: 1.0000 | ORAL_TABLET | Freq: Every day | ORAL | Status: DC

## 2013-01-13 NOTE — Telephone Encounter (Signed)
done

## 2013-01-23 ENCOUNTER — Other Ambulatory Visit: Payer: Self-pay

## 2013-02-04 ENCOUNTER — Other Ambulatory Visit (HOSPITAL_COMMUNITY)
Admission: RE | Admit: 2013-02-04 | Discharge: 2013-02-04 | Disposition: A | Discharge status: Home / Self Care | Payer: Managed Care, Other (non HMO) | Source: Ambulatory Visit | Attending: Medical | Admitting: Medical

## 2013-02-04 ENCOUNTER — Encounter: Payer: Self-pay | Admitting: Medical

## 2013-02-04 ENCOUNTER — Telehealth: Payer: Self-pay | Admitting: Internal Medicine

## 2013-02-04 ENCOUNTER — Ambulatory Visit (INDEPENDENT_AMBULATORY_CARE_PROVIDER_SITE_OTHER): Payer: Managed Care, Other (non HMO) | Admitting: Medical

## 2013-02-04 VITALS — BP 148/90 | HR 60 | Temp 98.1°F | Resp 16 | Ht 64.0 in | Wt 254.0 lb

## 2013-02-04 DIAGNOSIS — Z113 Encounter for screening for infections with a predominantly sexual mode of transmission: Secondary | ICD-10-CM

## 2013-02-04 DIAGNOSIS — Z124 Encounter for screening for malignant neoplasm of cervix: Secondary | ICD-10-CM

## 2013-02-04 DIAGNOSIS — Z309 Encounter for contraceptive management, unspecified: Secondary | ICD-10-CM

## 2013-02-04 DIAGNOSIS — N898 Other specified noninflammatory disorders of vagina: Secondary | ICD-10-CM

## 2013-02-04 DIAGNOSIS — R030 Elevated blood-pressure reading, without diagnosis of hypertension: Secondary | ICD-10-CM

## 2013-02-04 DIAGNOSIS — Z0000 Encounter for general adult medical examination without abnormal findings: Secondary | ICD-10-CM

## 2013-02-04 DIAGNOSIS — R109 Unspecified abdominal pain: Secondary | ICD-10-CM

## 2013-02-04 DIAGNOSIS — Z23 Encounter for immunization: Secondary | ICD-10-CM

## 2013-02-04 DIAGNOSIS — Z01419 Encounter for gynecological examination (general) (routine) without abnormal findings: Secondary | ICD-10-CM | POA: Insufficient documentation

## 2013-02-04 DIAGNOSIS — D1803 Hemangioma of intra-abdominal structures: Secondary | ICD-10-CM | POA: Insufficient documentation

## 2013-02-04 DIAGNOSIS — E669 Obesity, unspecified: Secondary | ICD-10-CM

## 2013-02-04 LAB — CBC WITH DIFFERENTIAL/PLATELET
Basophils Absolute: 0 10*3/uL (ref 0.0–0.1)
Basophils Relative: 1 % (ref 0–1)
HCT: 38.6 % (ref 36.0–46.0)
Lymphocytes Relative: 35 % (ref 12–46)
MCHC: 34.5 g/dL (ref 30.0–36.0)
Monocytes Absolute: 0.3 10*3/uL (ref 0.1–1.0)
Neutro Abs: 3.7 10*3/uL (ref 1.7–7.7)
Neutrophils Relative %: 58 % (ref 43–77)
RDW: 13.8 % (ref 11.5–15.5)
WBC: 6.4 10*3/uL (ref 4.0–10.5)

## 2013-02-04 LAB — POCT WET PREP (WET MOUNT): Clue Cells Wet Prep Whiff POC: NEGATIVE

## 2013-02-04 LAB — LIPID PANEL
Cholesterol: 188 mg/dL (ref 0–200)
HDL: 84 mg/dL (ref >39)
LDL Cholesterol: 91 mg/dL (ref 0–99)
Triglycerides: 64 mg/dL (ref <150)

## 2013-02-04 LAB — COMPREHENSIVE METABOLIC PANEL
ALT: 11 U/L (ref 0–35)
Albumin: 4.1 g/dL (ref 3.5–5.2)
BUN: 12 mg/dL (ref 6–23)
CO2: 27 mEq/L (ref 19–32)
Calcium: 9.4 mg/dL (ref 8.4–10.5)
Chloride: 104 mEq/L (ref 96–112)
Creat: 0.95 mg/dL (ref 0.50–1.10)
Potassium: 4.4 mEq/L (ref 3.5–5.3)

## 2013-02-04 LAB — POCT URINALYSIS DIPSTICK
Bilirubin, UA: NEGATIVE
Glucose, UA: NEGATIVE
Ketones, UA: NEGATIVE
Nitrite, UA: NEGATIVE
Urobilinogen, UA: NEGATIVE
pH, UA: 6

## 2013-02-04 MED ORDER — LEVONORGEST-ETH ESTRAD 91-DAY 0.15-0.03 MG PO TABS: 1.0000 | ORAL_TABLET | Freq: Every day | ORAL | Status: DC

## 2013-02-04 MED ORDER — FLUCONAZOLE 150 MG PO TABS: ORAL_TABLET | ORAL | Status: DC

## 2013-02-04 NOTE — Telephone Encounter (Signed)
Pt wanted diflucan to go to [address]

## 2013-02-04 NOTE — Patient Instructions (Signed)
Check insurance coverage for weight loss medications:  belviq  Qsymia  Contrave  I recommend you start exercising most days of the week. Start out with at least a 30 minute walk 5 days a week, and gradually build on this.  Try doing some circuit weight training twice a week  Make diet changes including cutting out soda, sweet tea, fast food, fried foods.     Do eat 3-4 fruit servings a day, he vegetables each meal, getting good grain sources such as oatmeal, cereal, whole-wheat pasta in small servings.  If you eat meat, limit portion size and preferably eat chicken fish or [place].  Limits salt intake.  Use My [employer] or [employer] app on the smart phone to track your exercise and diet.  Recheck in 1 month on weight loss efforts and to consider medication.

## 2013-02-04 NOTE — Progress Notes (Signed)
Subjective:   HPI  Joanne Johns is a 37 y.o. female who presents for a complete physical.  Preventative care: Last ophthalmology visit:yes- [facility] Last dental visit: yes Last colonoscopy:n/a Last mammogram:n/a Last gynecological exam: 11/2011 Last EKG:2010 Last labs:2013  Prior vaccinations: TD or Tdap:05/04/11 Influenza:no flu vaccine, declines Pneumococcal:n/a Shingles/Zostavax:n/a  Advanced directive:n/a Health care power of attorney:n/a Living will:n/a  Concerns: OB history: 5 pregnancies, 2 live births, 3 TABs.  Currently on Seasonale x 2+ years.   Has been on Depo Provera in the past.    She notes recurrent yeast infections randomly.  She notes 5-6 yeast infections this year. Switches up her soap at times.  She reports several days of white vaginal discharge, no odor, +itching.     Obesity - not exercising.   Drinks 1 soda daily, eats no breakfast.  Eats out a lot.  Eats a [employer] and pizza often.  Wants help with weight loss.    She notes some lower abdominal pain/pelvic pain and back pain, intermittent.  Sometimes likes menstrual cramps. No GU or GI changes no blood in urine or stool.  No diarrhea or constipation.    Daughter is in CNA class, checks her BP occasionally and numbers always good.   Reviewed their medical, surgical, family, social, medication, and allergy history and updated chart as appropriate.  Past Medical History  Diagnosis Date  . Abnormal Pap smear 2005    paps normal 2007, 2008, 2009, 2010, 2013  . Obesity   . Herpes gingivostomatitis   . Liver hemangioma 2008    per ultrasound  . History of EKG 02/09/09    normal  . History of pregnancy     5 pregnancies, 2 live births, 3 TABs    Past Surgical History  Procedure Laterality Date  . Knee surgery  2000    R knee, ACL, MCL meniscus  . Colposcopy  05/2003    History   Social History  . Marital Status: Single    Spouse Name: N/A    Number of Children: N/A  . Years of Education:  N/A   Occupational History  . 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 Smoking status: Never Smoker   . Smokeless tobacco: Never Used  . Alcohol Use: No  . Drug Use: No  . Sexual Activity: Yes    Partners: Male    Birth Control/ Protection: Pill   Other Topics Concern  . Not on file   Social History Narrative   Single, 2 children, ages 1yo son and 17yo daughter, exercise - none.  [employer] firm.  Sexually active.  No condom use.    Family History  Problem Relation Age of Onset  . Anesthesia problems Neg Hx   . Heart disease Neg Hx   . Cancer Maternal Uncle     leukemia    Current outpatient prescriptions:levonorgestrel-ethinyl estradiol (SEASONALE,INTROVALE,JOLESSA) 0.15-0.03 MG tablet, Take 1 tablet by mouth daily., Disp: 1 Package, Rfl: 0  No Known Allergies   Review of Systems Constitutional: -fever, -chills, -sweats, +unexpected weight change, -decreased appetite, -fatigue Allergy: -sneezing, -itching, -congestion Dermatology: -changing moles, --rash, -lumps ENT: -runny nose, -ear pain, -sore throat, -hoarseness, -sinus pain, -teeth pain, - ringing in ears, -hearing loss, -nosebleeds Cardiology: -chest pain, -palpitations, -swelling, -difficulty breathing when lying flat, -waking up short of breath Respiratory: -cough, -shortness of breath, -difficulty breathing with exercise or exertion, -wheezing, -coughing up blood Gastroenterology: +abdominal pain, -nausea, -  vomiting, -diarrhea, -constipation, -blood in stool, -changes in bowel movement, -difficulty swallowing or eating Hematology: -bleeding, -bruising  Musculoskeletal: -joint aches, -muscle aches, -joint swelling, +back pain, -neck pain, -cramping, -changes in gait Ophthalmology: denies vision changes, eye redness, itching, discharge Urology: -burning with urination, -difficulty urinating, -blood in urine, -urinary frequency, -urgency,  -incontinence Neurology: -headache, -weakness, -tingling, -numbness, -memory loss, -falls, -dizziness Psychology: -depressed mood, -agitation, -sleep problems     Objective:   Physical Exam  BP 148/90  Pulse 60  Temp(Src) 98.1 F (36.7 C) (Oral)  Resp 16  Ht 5\' 4"  (1.626 m)  Wt 254 lb (115.214 kg)  BMI 43.58 kg/m2  Repeat BP 125/85 right arm.  General appearance: alert, no distress, WD/WN, obese AA female Skin: unremarkable, no worrisome lesions HEENT: normocephalic, conjunctiva/corneas normal, sclerae anicteric, PERRLA, EOMi, nares patent, no discharge or erythema, pharynx normal Oral cavity: MMM, tongue normal, teeth in good repair Neck: supple, no lymphadenopathy, no thyromegaly, no masses, normal ROM, no bruits Chest: non tender, normal shape and expansion Heart: RRR, normal S1, S2, no murmurs Lungs: CTA bilaterally, no wheezes, rhonchi, or rales Abdomen: +bs, soft, non tender, non distended, no masses, no hepatomegaly, no splenomegaly, no bruits Back: non tender, normal ROM, no scoliosis Musculoskeletal: upper extremities non tender, no obvious deformity, normal ROM throughout, lower extremities non tender, no obvious deformity, normal ROM throughout Extremities: no edema, no cyanosis, no clubbing Pulses: 2+ symmetric, upper and lower extremities, normal cap refill Neurological: alert, oriented x 3, CN2-12 intact, strength normal upper extremities and lower extremities, sensation normal throughout, DTRs 2+ throughout, no cerebellar signs, gait normal Psychiatric: normal affect, behavior normal, pleasant  Breast: nontender, no masses or lumps, no skin changes, no nipple discharge or inversion, no axillary lymphadenopathy Gyn: Normal external genitalia without lesions, vagina with normal mucosa, cervix without lesions, no cervical motion tenderness, slight white/creamy abnormal vaginal discharge.  Uterus and adnexa not enlarged, nontender, no masses.  Pap performed.  Exam  chaperoned by nurse. Rectal: deferred    Assessment and Plan :    Encounter Diagnoses  Name Primary?  . Routine general medical examination at a health care facility Yes  . Obesity, unspecified   . Contraception management   . Elevated blood pressure reading without diagnosis of hypertension   . Screening for cervical cancer   . Screen for STD (sexually transmitted disease)   . Liver hemangioma   . Abdominal pain, unspecified site   . Need for influenza vaccination     Physical exam - discussed healthy lifestyle, diet, exercise, preventative care, vaccinations, and addressed their concerns.  Handout given. Obesity - advised she begin diet changes and exercise as discussed.   If she demonstrates efforts, then we can consider weight loss medication too.  Contraception management - discussed risks, benefits of medication, options, and she wants to c/t seasonale at this time Elevated BP - continue to monitor.  Most prior readings normal.  Few random readings in the last 6 years abnormal. Pap and std screening today.  Discussed condom use, safe sex. Liver hemangioma - repeat liver ultrasound for surveillance Abdomina pain - urine culture sent given UA findings Counseled on the influenza virus vaccine.  Vaccine information sheet given.  Influenza vaccine given after consent obtained. Follow-up pending labs

## 2013-02-05 ENCOUNTER — Encounter: Payer: Managed Care, Other (non HMO) | Admitting: Medical

## 2013-02-05 LAB — RPR

## 2013-02-05 LAB — TSH: TSH: 1.87 u[IU]/mL (ref 0.350–4.500)

## 2013-02-07 ENCOUNTER — Other Ambulatory Visit: Payer: Self-pay | Admitting: Medical

## 2013-02-07 ENCOUNTER — Other Ambulatory Visit: Payer: Self-pay | Admitting: Family Medicine

## 2013-02-07 ENCOUNTER — Telehealth: Payer: Self-pay | Admitting: Internal Medicine

## 2013-02-07 LAB — URINE CULTURE: Colony Count: 80000

## 2013-02-07 MED ORDER — SULFAMETHOXAZOLE-TMP DS 800-160 MG PO TABS: 1.0000 | ORAL_TABLET | Freq: Two times a day (BID) | ORAL | Status: DC

## 2013-02-07 NOTE — Telephone Encounter (Signed)
pls call pt

## 2013-02-07 NOTE — Telephone Encounter (Signed)
Pt wants to hear about her lab work. Please call pt @ [phone]

## 2013-02-10 NOTE — Telephone Encounter (Signed)
Patient is aware of her lab results. I spoke to her on 02/07/13. CLS

## 2013-02-17 ENCOUNTER — Ambulatory Visit
Admission: RE | Admit: 2013-02-17 | Discharge: 2013-02-17 | Disposition: A | Discharge status: Home / Self Care | Payer: Managed Care, Other (non HMO) | Source: Ambulatory Visit | Attending: Medical | Admitting: Medical

## 2013-02-17 ENCOUNTER — Other Ambulatory Visit: Payer: Self-pay | Admitting: Medical

## 2013-02-17 DIAGNOSIS — D1803 Hemangioma of intra-abdominal structures: Secondary | ICD-10-CM

## 2013-02-17 DIAGNOSIS — K769 Liver disease, unspecified: Secondary | ICD-10-CM

## 2013-02-17 DIAGNOSIS — E669 Obesity, unspecified: Secondary | ICD-10-CM

## 2013-02-17 DIAGNOSIS — R935 Abnormal findings on diagnostic imaging of other abdominal regions, including retroperitoneum: Secondary | ICD-10-CM

## 2013-02-28 ENCOUNTER — Other Ambulatory Visit: Payer: Managed Care, Other (non HMO)

## 2013-03-07 ENCOUNTER — Telehealth: Payer: Self-pay | Admitting: Family Medicine

## 2013-03-07 NOTE — Telephone Encounter (Signed)
Called [employer] [phone] obtained prior Berkley Harvey [phone] valid today thru 04/05/13 for MRI abd with and without contrast.  Ok Edwards at [facility] t/w Archie Patten [phone] gave prior Berkley Harvey as Rinaldo Cloud on phone.

## 2013-03-08 ENCOUNTER — Ambulatory Visit
Admission: RE | Admit: 2013-03-08 | Discharge: 2013-03-08 | Disposition: A | Discharge status: Home / Self Care | Payer: BC Managed Care – PPO | Source: Ambulatory Visit | Attending: Medical | Admitting: Medical

## 2013-03-08 DIAGNOSIS — K769 Liver disease, unspecified: Secondary | ICD-10-CM

## 2013-03-08 DIAGNOSIS — R935 Abnormal findings on diagnostic imaging of other abdominal regions, including retroperitoneum: Secondary | ICD-10-CM

## 2013-03-08 MED ORDER — GADOXETATE DISODIUM 0.25 MMOL/ML IV SOLN
10.0000 mL | Freq: Once | INTRAVENOUS | Status: AC | PRN
  Administered 2013-03-08: 10 mL via INTRAVENOUS

## 2013-03-20 DIAGNOSIS — R7301 Impaired fasting glucose: Secondary | ICD-10-CM

## 2013-03-20 HISTORY — DX: Impaired fasting glucose: R73.01

## 2013-04-11 ENCOUNTER — Other Ambulatory Visit: Payer: Self-pay | Admitting: Medical

## 2014-01-02 ENCOUNTER — Other Ambulatory Visit: Payer: Self-pay

## 2014-01-19 ENCOUNTER — Encounter: Payer: Self-pay | Admitting: Medical

## 2014-02-05 ENCOUNTER — Ambulatory Visit (INDEPENDENT_AMBULATORY_CARE_PROVIDER_SITE_OTHER): Payer: BC Managed Care – PPO | Admitting: Medical

## 2014-02-05 ENCOUNTER — Encounter: Payer: Self-pay | Admitting: Medical

## 2014-02-05 VITALS — BP 128/88 | HR 92 | Temp 98.4°F | Resp 16 | Ht 65.0 in | Wt 267.0 lb

## 2014-02-05 DIAGNOSIS — R7301 Impaired fasting glucose: Secondary | ICD-10-CM | POA: Insufficient documentation

## 2014-02-05 DIAGNOSIS — Z3041 Encounter for surveillance of contraceptive pills: Secondary | ICD-10-CM | POA: Insufficient documentation

## 2014-02-05 DIAGNOSIS — R102 Pelvic and perineal pain: Secondary | ICD-10-CM

## 2014-02-05 DIAGNOSIS — Z0000 Encounter for general adult medical examination without abnormal findings: Secondary | ICD-10-CM

## 2014-02-05 DIAGNOSIS — M545 Low back pain, unspecified: Secondary | ICD-10-CM

## 2014-02-05 DIAGNOSIS — Z23 Encounter for immunization: Secondary | ICD-10-CM

## 2014-02-05 DIAGNOSIS — E669 Obesity, unspecified: Secondary | ICD-10-CM

## 2014-02-05 DIAGNOSIS — Z113 Encounter for screening for infections with a predominantly sexual mode of transmission: Secondary | ICD-10-CM

## 2014-02-05 LAB — POCT URINALYSIS DIPSTICK
BILIRUBIN UA: NEGATIVE
GLUCOSE UA: NEGATIVE
Ketones, UA: NEGATIVE
Leukocytes, UA: NEGATIVE
Nitrite, UA: NEGATIVE
Protein, UA: NEGATIVE
SPEC GRAV UA: 1.02
Urobilinogen, UA: NEGATIVE
pH, UA: 6

## 2014-02-05 LAB — CBC
HCT: 38.8 % (ref 36.0–46.0)
HEMOGLOBIN: 13 g/dL (ref 12.0–15.0)
MCH: 29.4 pg (ref 26.0–34.0)
MCHC: 33.5 g/dL (ref 30.0–36.0)
MCV: 87.8 fL (ref 78.0–100.0)
MPV: 9.7 fL (ref 9.4–12.4)
PLATELETS: 285 10*3/uL (ref 150–400)
RBC: 4.42 MIL/uL (ref 3.87–5.11)
RDW: 13.5 % (ref 11.5–15.5)
WBC: 6.7 10*3/uL (ref 4.0–10.5)

## 2014-02-05 LAB — COMPREHENSIVE METABOLIC PANEL
ALBUMIN: 4 g/dL (ref 3.5–5.2)
ALT: 11 U/L (ref 0–35)
AST: 12 U/L (ref 0–37)
Alkaline Phosphatase: 50 U/L (ref 39–117)
BUN: 12 mg/dL (ref 6–23)
CALCIUM: 9.1 mg/dL (ref 8.4–10.5)
CHLORIDE: 106 meq/L (ref 96–112)
CO2: 23 mEq/L (ref 19–32)
Creat: 1.04 mg/dL (ref 0.50–1.10)
GLUCOSE: 80 mg/dL (ref 70–99)
POTASSIUM: 4.4 meq/L (ref 3.5–5.3)
Sodium: 137 mEq/L (ref 135–145)
TOTAL PROTEIN: 6.4 g/dL (ref 6.0–8.3)
Total Bilirubin: 0.5 mg/dL (ref 0.2–1.2)

## 2014-02-05 LAB — TSH: TSH: 1.403 u[IU]/mL (ref 0.350–4.500)

## 2014-02-05 NOTE — Progress Notes (Signed)
Subjective:   HPI  Joanne Johns is a 38 y.o. female who presents for a complete physical.  Preventative care: Last ophthalmology visit:n/a Last dental visit:yes Last colonoscopy:n/a Last mammogram:n/a Last gynecological exam:2014 Last EKG:? Last labs:2014  Prior vaccinations: TD or Tdap:she believes its been within 10 years Influenza:will get today Pneumococcal:n/a Shingles/Zostavax:na  Concerns: Occasional low back pain, occasional lower abdominal pain without any other symptoms, seems to be random.  No GI or GU symptom, no particular vaginal symptoms .  On extended cycle OCP so only gets period every 3 months.  Uses OCP for contraception.   No new sesxual partners.    Reviewed their medical, surgical, family, social, medication, and allergy history and updated chart as appropriate.  Past Medical History  Diagnosis Date  . Abnormal Pap smear 2005    paps normal 2007, 2008, 2009, 2010, 2013  . Obesity   . Herpes gingivostomatitis   . Liver hemangioma 2008    per ultrasound  . History of EKG 02/09/09    normal  . History of pregnancy     5 pregnancies, 2 live births, 3 TABs    Past Surgical History  Procedure Laterality Date  . Knee surgery  2000    R knee, ACL, MCL meniscus  . Colposcopy  05/2003    History   Social History  . Marital Status: Single    Spouse Name: N/A    Number of Children: N/A  . Years of Education: N/A   Occupational History  . 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 Smoking status: Never Smoker   . Smokeless tobacco: Never Used  . Alcohol Use: No  . Drug Use: No  . Sexual Activity:    Partners: Male    Birth Control/ Protection: Pill   Other Topics Concern  . Not on file   Social History Narrative   Single, 2 children, ages 7yo son and 38yo daughter, exercise - none.  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    Family History  Problem Relation Age of  Onset  . Anesthesia problems Neg Hx   . Heart disease Neg Hx   . Diabetes Neg Hx   . Hypertension Neg Hx   . Hyperlipidemia Neg Hx   . Stroke Neg Hx   . Cancer Maternal Uncle     leukemia    Current outpatient prescriptions: levonorgestrel-ethinyl estradiol (SEASONALE,INTROVALE,JOLESSA) 0.15-0.03 MG tablet, Take 1 tablet by mouth daily., Disp: 1 Package, Rfl: 3  No Known Allergies   Review of Systems Constitutional: -fever, -chills, -sweats, -unexpected weight change, -decreased appetite, -fatigue Allergy: -sneezing, -itching, -congestion Dermatology: -changing moles, --rash, -lumps ENT: -runny nose, -ear pain, -sore throat, -hoarseness, -sinus pain, -teeth pain, - ringing in ears, -hearing loss, -nosebleeds Cardiology: -chest pain, -palpitations, -swelling, -difficulty breathing when lying flat, -waking up short of breath Respiratory: -cough, -shortness of breath, -difficulty breathing with exercise or exertion, -wheezing, -coughing up blood Gastroenterology: +abdominal pain, -nausea, -vomiting, -diarrhea, -constipation, -blood in stool, -changes in bowel movement, -difficulty swallowing or eating Hematology: -bleeding, -bruising  Musculoskeletal: -joint aches, -muscle aches, -joint swelling, +back pain, -neck pain, -cramping, -changes in gait Ophthalmology: denies vision changes, eye redness, itching, discharge Urology: -burning with urination, -difficulty urinating, -blood in urine, -urinary frequency, -urgency, -incontinence Neurology: -headache, -weakness, -tingling, -numbness, -memory loss, -falls, -dizziness Psychology: -depressed mood, -agitation, -sleep problems     Objective:   Physical Exam  BP  128/88 mmHg  Pulse 92  Temp(Src) 98.4 F (36.9 C) (Oral)  Resp 16  Ht 5\' 5"  (1.651 m)  Wt 267 lb (121.11 kg)  BMI 44.43 kg/m2  General appearance: alert, no distress, WD/WN, obese AA female Skin: unremarkable, no worrisome lesions HEENT: normocephalic,  conjunctiva/corneas normal, sclerae anicteric, PERRLA, EOMi, nares patent, no discharge or erythema, pharynx normal Oral cavity: MMM, tongue normal, teeth in good repair Neck: supple, no lymphadenopathy, no thyromegaly, no masses, normal ROM, no bruits Chest: non tender, normal shape and expansion Heart: RRR, normal S1, S2, no murmurs Lungs: CTA bilaterally, no wheezes, rhonchi, or rales Abdomen: +bs, soft, non tender, non distended, no masses, no hepatomegaly, no splenomegaly, no bruits Back: non tender, normal ROM, no scoliosis Musculoskeletal: upper extremities non tender, no obvious deformity, normal ROM throughout, lower extremities non tender, no obvious deformity, normal ROM throughout Extremities: no edema, no cyanosis, no clubbing Pulses: 2+ symmetric, upper and lower extremities, normal cap refill Neurological: alert, oriented x 3, CN2-12 intact, strength normal upper extremities and lower extremities, sensation normal throughout, DTRs 2+ throughout, no cerebellar signs, gait normal Psychiatric: normal affect, behavior normal, pleasant  Breast: nontender, no masses or lumps, no skin changes, no nipple discharge or inversion, no axillary lymphadenopathy Gyn: Normal external genitalia without lesions, vagina with normal mucosa, cervix without lesions, no cervical motion tenderness, slight white/creamy abnormal vaginal discharge. Uterus and adnexa not enlarged, nontender, no masses. Exam chaperoned by nurse. Rectal: deferred  Assessment and Plan :    Encounter Diagnoses  Name Primary?  . Encounter for health maintenance examination in adult Yes  . Encounter for surveillance of contraceptive pills   . Obesity   . Impaired fasting blood sugar   . Need for prophylactic vaccination and inoculation against influenza   . Bilateral low back pain without sciatica   . Pelvic pain in female   . Screen for STD (sexually transmitted disease)    Physical exam - discussed healthy lifestyle,  diet, exercise, preventative care, vaccinations, and addressed their concerns.  Handout given.  Contraception - Doing well on current OCP.  Discussed treatment options, types of OCPs, various other methods available.  Discussed non hormonal contraception as well.  We discussed risks of OCPs including headache, nausea, breast tenderness, irregular bleeding or spotting, possible risks of blood clots, heart attack, stroke.     Discussed advantages of OCPs including possible decreased premenstrual symptoms, improving cramps, regulating cycles, decreasing heavy flow.  Discussed proper use of medication.  She understands the benefits, risks, and wishes to c/t OCPs.  Discussed safe sex, STD prevention, condom use.     Obesity - discussed risks of obesity.  Advised need to get more motivated with diet and exercise  impaired fastin glucose - recheck lab today  Counseled on the influenza virus vaccine.  Vaccine information sheet given.  Influenza vaccine given after consent obtained.  Back pain, lower abdominal pain - pending labs, consider ultrasound pelvis.  Wet prep with bacteria and lots of WBCs, otherwise unremarkable  Follow-up pending labs

## 2014-02-06 ENCOUNTER — Encounter: Payer: Self-pay | Admitting: Medical

## 2014-02-06 ENCOUNTER — Other Ambulatory Visit: Payer: Self-pay | Admitting: Medical

## 2014-02-06 DIAGNOSIS — R1084 Generalized abdominal pain: Secondary | ICD-10-CM

## 2014-02-06 LAB — RPR

## 2014-02-06 LAB — HIV ANTIBODY (ROUTINE TESTING W REFLEX): HIV: NONREACTIVE

## 2014-02-06 LAB — GC/CHLAMYDIA PROBE AMP
CT Probe RNA: NEGATIVE
GC PROBE AMP APTIMA: NEGATIVE

## 2014-02-06 LAB — HEMOGLOBIN A1C
Hgb A1c MFr Bld: 5.8 % — ABNORMAL HIGH (ref <5.7)
Mean Plasma Glucose: 120 mg/dL — ABNORMAL HIGH (ref <117)

## 2014-02-06 NOTE — Addendum Note (Signed)
Addended by: Carlena Hurl on: 02/06/2014 05:09 AM   Modules accepted: Orders

## 2014-02-17 ENCOUNTER — Telehealth: Payer: Self-pay | Admitting: Medical

## 2014-02-17 ENCOUNTER — Other Ambulatory Visit: Payer: Self-pay | Admitting: Medical

## 2014-02-17 MED ORDER — LEVONORGEST-ETH ESTRAD 91-DAY 0.15-0.03 MG PO TABS: 1.0000 | ORAL_TABLET | Freq: Every day | ORAL | Status: DC

## 2014-02-17 NOTE — Telephone Encounter (Signed)
Not sure what happened, have her call back this afternoon or tomorrow if the pharmacy does not have the prescription

## 2014-02-18 ENCOUNTER — Other Ambulatory Visit: Payer: Managed Care, Other (non HMO)

## 2014-03-20 DIAGNOSIS — A749 Chlamydial infection, unspecified: Secondary | ICD-10-CM

## 2014-03-20 HISTORY — DX: Chlamydial infection, unspecified: A74.9

## 2014-06-23 ENCOUNTER — Telehealth: Payer: Self-pay | Admitting: Internal Medicine

## 2014-06-23 NOTE — Telephone Encounter (Signed)
Faxed over medical records to [facility] center @ [phone]

## 2014-12-25 ENCOUNTER — Other Ambulatory Visit (HOSPITAL_COMMUNITY)
Admission: RE | Admit: 2014-12-25 | Discharge: 2014-12-25 | Disposition: A | Discharge status: Home / Self Care | Payer: 59 | Source: Ambulatory Visit | Attending: Medical | Admitting: Medical

## 2014-12-25 ENCOUNTER — Ambulatory Visit (INDEPENDENT_AMBULATORY_CARE_PROVIDER_SITE_OTHER): Payer: 59 | Admitting: Medical

## 2014-12-25 ENCOUNTER — Encounter: Payer: Self-pay | Admitting: Medical

## 2014-12-25 VITALS — BP 140/98 | HR 82 | Temp 98.2°F | Ht 64.0 in | Wt 268.0 lb

## 2014-12-25 DIAGNOSIS — Z3041 Encounter for surveillance of contraceptive pills: Secondary | ICD-10-CM

## 2014-12-25 DIAGNOSIS — Z01419 Encounter for gynecological examination (general) (routine) without abnormal findings: Secondary | ICD-10-CM | POA: Insufficient documentation

## 2014-12-25 DIAGNOSIS — R7301 Impaired fasting glucose: Secondary | ICD-10-CM

## 2014-12-25 DIAGNOSIS — R87810 Cervical high risk human papillomavirus (HPV) DNA test positive: Secondary | ICD-10-CM | POA: Insufficient documentation

## 2014-12-25 DIAGNOSIS — G479 Sleep disorder, unspecified: Secondary | ICD-10-CM | POA: Diagnosis not present

## 2014-12-25 DIAGNOSIS — E669 Obesity, unspecified: Secondary | ICD-10-CM | POA: Insufficient documentation

## 2014-12-25 DIAGNOSIS — Z113 Encounter for screening for infections with a predominantly sexual mode of transmission: Secondary | ICD-10-CM | POA: Insufficient documentation

## 2014-12-25 DIAGNOSIS — Z23 Encounter for immunization: Secondary | ICD-10-CM | POA: Insufficient documentation

## 2014-12-25 DIAGNOSIS — Z0000 Encounter for general adult medical examination without abnormal findings: Secondary | ICD-10-CM | POA: Diagnosis not present

## 2014-12-25 DIAGNOSIS — Z124 Encounter for screening for malignant neoplasm of cervix: Secondary | ICD-10-CM | POA: Insufficient documentation

## 2014-12-25 DIAGNOSIS — Z1151 Encounter for screening for human papillomavirus (HPV): Secondary | ICD-10-CM | POA: Diagnosis not present

## 2014-12-25 DIAGNOSIS — R102 Pelvic and perineal pain: Secondary | ICD-10-CM | POA: Insufficient documentation

## 2014-12-25 LAB — COMPREHENSIVE METABOLIC PANEL
ALT: 16 U/L (ref 6–29)
AST: 14 U/L (ref 10–30)
Albumin: 4.2 g/dL (ref 3.6–5.1)
Alkaline Phosphatase: 55 U/L (ref 33–115)
BILIRUBIN TOTAL: 0.5 mg/dL (ref 0.2–1.2)
BUN: 11 mg/dL (ref 7–25)
CHLORIDE: 104 mmol/L (ref 98–110)
CO2: 26 mmol/L (ref 20–31)
Calcium: 8.9 mg/dL (ref 8.6–10.2)
Creat: 1.02 mg/dL (ref 0.50–1.10)
Glucose, Bld: 78 mg/dL (ref 65–99)
Potassium: 4.6 mmol/L (ref 3.5–5.3)
SODIUM: 138 mmol/L (ref 135–146)
Total Protein: 6.5 g/dL (ref 6.1–8.1)

## 2014-12-25 LAB — POCT URINALYSIS DIPSTICK
Bilirubin, UA: NEGATIVE
GLUCOSE UA: NEGATIVE
KETONES UA: NEGATIVE
LEUKOCYTES UA: NEGATIVE
Nitrite, UA: NEGATIVE
PROTEIN UA: NEGATIVE
SPEC GRAV UA: 1.025
Urobilinogen, UA: NEGATIVE
pH, UA: 6

## 2014-12-25 LAB — CBC
HEMATOCRIT: 40.3 % (ref 36.0–46.0)
Hemoglobin: 13.3 g/dL (ref 12.0–15.0)
MCH: 29.1 pg (ref 26.0–34.0)
MCHC: 33 g/dL (ref 30.0–36.0)
MCV: 88.2 fL (ref 78.0–100.0)
MPV: 9.9 fL (ref 8.6–12.4)
Platelets: 303 10*3/uL (ref 150–400)
RBC: 4.57 MIL/uL (ref 3.87–5.11)
RDW: 13.9 % (ref 11.5–15.5)
WBC: 6.4 10*3/uL (ref 4.0–10.5)

## 2014-12-25 LAB — HEMOGLOBIN A1C
HEMOGLOBIN A1C: 5.9 % — AB (ref <5.7)
Mean Plasma Glucose: 123 mg/dL — ABNORMAL HIGH (ref <117)

## 2014-12-25 LAB — POCT URINE PREGNANCY: PREG TEST UR: NEGATIVE

## 2014-12-25 MED ORDER — LEVONORGEST-ETH ESTRAD 91-DAY 0.15-0.03 MG PO TABS: 1.0000 | ORAL_TABLET | Freq: Every day | ORAL | Status: DC

## 2014-12-25 NOTE — Progress Notes (Signed)
Her Neck Circumference is 14.75 inches

## 2014-12-25 NOTE — Progress Notes (Signed)
Subjective:   HPI  Joanne Johns is a 39 y.o. female who presents for a complete physical.  Concerns: Fatigue, tired a lot, not sleeping well currently.  Single parent with 9 year old in the house, just moved to new apartment she is not familiar with yet.  Does fine on current OCPs.  Wants to c/t these.  No new sexual partners.    Still gets occasional intermittent flank pain or pelvic pain, not attributable to any certain thing. No vaginal discharge, no urinary c/o, no bowel c/o.  No strenuous activity  Not exercising.     Reviewed their medical, surgical, family, social, medication, and allergy history and updated chart as appropriate.  Past Medical History  Diagnosis Date  . Abnormal Pap smear 2005    paps normal 2007, 2008, 2009, 2010, 2013, 2014  . Obesity   . Herpes gingivostomatitis   . Liver hemangioma 2008    per ultrasound and 02/2013 MRI abdomen  . History of EKG 02/09/09    normal  . History of pregnancy     5 pregnancies, 2 live births, 3 TABs  . Impaired fasting blood sugar 2015  . Back pain     Past Surgical History  Procedure Laterality Date  . Knee surgery  2000    R knee, ACL, MCL meniscus  . Colposcopy  05/2003    Social History   Social History  . Marital Status: Single    Spouse Name: N/A  . Number of Children: N/A  . Years of Education: N/A   Occupational History  . 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 Smoking status: Never Smoker   . Smokeless tobacco: Never Used  . Alcohol Use: No  . Drug Use: No  . Sexual Activity:    Partners: Male    Birth Control/ Protection: Pill   Other Topics Concern  . Not on file   Social History Narrative   Single, relationship with son's father off and on x 3 years, 2 children, ages 60yo son and 86yo daughter.  Exercise - not much.  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    Family History  Problem Relation Age of Onset  .  Anesthesia problems Neg Hx   . Heart disease Neg Hx   . Diabetes Neg Hx   . Hypertension Neg Hx   . Hyperlipidemia Neg Hx   . Stroke Neg Hx   . Cancer Maternal Uncle     leukemia     Current outpatient prescriptions:  .  levonorgestrel-ethinyl estradiol (SEASONALE,INTROVALE,JOLESSA) 0.15-0.03 MG tablet, Take 1 tablet by mouth daily., Disp: 1 Package, Rfl: 3  No Known Allergies   Review of Systems Constitutional: -fever, -chills, -sweats, -unexpected weight change, -decreased appetite, -fatigue Allergy: -sneezing, -itching, -congestion Dermatology: -changing moles, --rash, -lumps ENT: -runny nose, -ear pain, -sore throat, -hoarseness, -sinus pain, -teeth pain, - ringing in ears, -hearing loss, -nosebleeds Cardiology: -chest pain, -palpitations, -swelling, -difficulty breathing when lying flat, -waking up short of breath Respiratory: -cough, -shortness of breath, -difficulty breathing with exercise or exertion, -wheezing, -coughing up blood Gastroenterology: +abdominal pain, -nausea, -vomiting, -diarrhea, -constipation, -blood in stool, -changes in bowel movement, -difficulty swallowing or eating Hematology: -bleeding, -bruising  Musculoskeletal: -joint aches, -muscle aches, -joint swelling, +back pain, -neck pain, -cramping, -changes in gait Ophthalmology: denies vision changes, eye redness, itching, discharge Urology: -burning with urination, -difficulty urinating, -blood in urine, -urinary frequency, -urgency, -incontinence  Neurology: -headache, -weakness, -tingling, -numbness, -memory loss, -falls, -dizziness Psychology: -depressed mood, -agitation, -sleep problems     Objective:   Physical Exam  BP 140/98 mmHg  Pulse 82  Temp(Src) 98.2 F (36.8 C)  Ht 5\' 4"  (1.626 m)  Wt 268 lb (121.564 kg)  BMI 45.98 kg/m2  General appearance: alert, no distress, WD/WN, obese AA female Skin: unremarkable, no worrisome lesions HEENT: normocephalic, conjunctiva/corneas normal, sclerae  anicteric, PERRLA, EOMi, nares patent, no discharge or erythema, pharynx normal Oral cavity: MMM, tongue normal, teeth in good repair Neck: supple, no lymphadenopathy, no thyromegaly, no masses, normal ROM, no bruits Chest: non tender, normal shape and expansion Heart: RRR, normal S1, S2, no murmurs Lungs: CTA bilaterally, no wheezes, rhonchi, or rales Abdomen: +bs, soft, non tender, non distended, no masses, no hepatomegaly, no splenomegaly, no bruits Back: non tender, normal ROM, no scoliosis Musculoskeletal: upper extremities non tender, no obvious deformity, normal ROM throughout, lower extremities non tender, no obvious deformity, normal ROM throughout Extremities: no edema, no cyanosis, no clubbing Pulses: 2+ symmetric, upper and lower extremities, normal cap refill Neurological: alert, oriented x 3, CN2-12 intact, strength normal upper extremities and lower extremities, sensation normal throughout, DTRs 2+ throughout, no cerebellar signs, gait normal Psychiatric: normal affect, behavior normal, pleasant  Breast: nontender, no masses or lumps, no skin changes, no nipple discharge or inversion, no axillary lymphadenopathy Gyn: Normal external genitalia without lesions, vagina with normal mucosa, cervix without lesions, no cervical motion tenderness, slight white/creamy abnormal vaginal discharge. Uterus and adnexa not enlarged, nontender, no masses. Exam chaperoned by nurse. Rectal: deferred  Assessment and Plan :    Encounter Diagnoses  Name Primary?  . Encounter for health maintenance examination in adult Yes  . Encounter for surveillance of contraceptive pills   . Impaired fasting blood sugar   . Obesity   . Screen for STD (sexually transmitted disease)   . Screening for cervical cancer   . Need for prophylactic vaccination and inoculation against influenza   . Sleep disturbance   . Pelvic pain in female    Physical exam - discussed healthy lifestyle, diet, exercise,  preventative care, vaccinations, and addressed their concerns.  Handout given.  Contraception - Doing well on current OCP.  Discussed treatment options, types of OCPs, various other methods available.  Discussed non hormonal contraception as well.  We discussed risks of OCPs including headache, nausea, breast tenderness, irregular bleeding or spotting, possible risks of blood clots, heart attack, stroke.     Discussed advantages of OCPs including possible decreased premenstrual symptoms, improving cramps, regulating cycles, decreasing heavy flow.  Discussed proper use of medication.  She understands the benefits, risks, and wishes to c/t OCPs.  Discussed safe sex, STD prevention, condom use.     Obesity - discussed risks of obesity.  Advised need to get more motivated with diet and exercise  Epworth score of 4.  Neck circumference 14.75".  impaired fastin glucose - labs today  Counseled on the influenza virus vaccine.  Vaccine information sheet given.  Influenza vaccine given after consent obtained.  Pelvic pain - intermittent, unremarkable exam  Sleep disturbance - consider OTC sleep aid, consider sleep study.  Follow-up pending labs

## 2014-12-25 NOTE — Patient Instructions (Signed)
  Thank you for giving me the opportunity to serve you today.    Your diagnosis today includes: Encounter Diagnoses  Name Primary?  . Encounter for health maintenance examination in adult Yes  . Encounter for surveillance of contraceptive pills   . Impaired fasting blood sugar   . Obesity   . Screen for STD (sexually transmitted disease)   . Screening for cervical cancer   . Need for prophylactic vaccination and inoculation against influenza   . Sleep disturbance   . Pelvic pain in female     Recommendations:  Consider alarm for doors  consider sleep aid such as Unisom, Benadryl, or Zquil for the time being  We will call with lab results  Find a way to exercise daily  Eat a healthy low fat diet.  Limit fast food, fried foods, large portions, sweets, soda  Consider sleep study, let me know if interested in pursuing this

## 2014-12-26 LAB — HIV ANTIBODY (ROUTINE TESTING W REFLEX): HIV 1&2 Ab, 4th Generation: NONREACTIVE

## 2014-12-26 LAB — RPR

## 2014-12-28 LAB — CYTOLOGY - PAP

## 2014-12-29 ENCOUNTER — Other Ambulatory Visit: Payer: Self-pay | Admitting: Medical

## 2014-12-29 MED ORDER — HYDROMORPHONE HCL 1 MG/ML IJ SOLN
INTRAMUSCULAR | Status: AC
  Filled 2014-12-29: qty 1

## 2014-12-29 MED ORDER — AZITHROMYCIN 500 MG PO TABS: ORAL_TABLET | ORAL | Status: DC

## 2015-01-01 ENCOUNTER — Other Ambulatory Visit: Payer: Self-pay | Admitting: Medical

## 2015-01-01 DIAGNOSIS — A749 Chlamydial infection, unspecified: Secondary | ICD-10-CM

## 2015-01-22 ENCOUNTER — Other Ambulatory Visit: Payer: Self-pay | Admitting: Medical

## 2015-01-22 ENCOUNTER — Telehealth: Payer: Self-pay

## 2015-01-22 MED ORDER — LIRAGLUTIDE -WEIGHT MANAGEMENT 18 MG/3ML ~~LOC~~ SOPN: 18.0000 mg | PEN_INJECTOR | Freq: Every day | SUBCUTANEOUS | Status: DC

## 2015-01-22 NOTE — Telephone Encounter (Signed)
I sent Saxenda.  Lets run it through insurance and see what cost will be.   Have her call back to let me know cost before she decides to move forward with this.

## 2015-01-22 NOTE — Telephone Encounter (Signed)
Pt called back about the weight loss that was discussed and she wants to go with Saxenda. Insurance doesn't cover any of the options that were said. She wanted to ask if you knew of a cheaper option?

## 2015-01-26 NOTE — Telephone Encounter (Signed)
LM informing her to do so and CB

## 2015-02-04 ENCOUNTER — Telehealth: Payer: Self-pay

## 2015-02-04 ENCOUNTER — Other Ambulatory Visit: Payer: Self-pay | Admitting: Medical

## 2015-02-04 MED ORDER — PHENTERMINE-TOPIRAMATE ER 3.75-23 MG PO CP24: 1.0000 | ORAL_CAPSULE | ORAL | Status: DC

## 2015-02-04 MED ORDER — PHENTERMINE-TOPIRAMATE ER 7.5-46 MG PO CP24: 1.0000 | ORAL_CAPSULE | ORAL | Status: DC

## 2015-02-04 NOTE — Telephone Encounter (Signed)
Pt called back about the Saxenda that was called in for her weight loss. She said when she went to get it, it cost a little over $1,000. She had mentioned before that her insurance won't cover any of the options that were given to her, but she is requesting whichever is cheapest.

## 2015-02-04 NOTE — Telephone Encounter (Signed)
Call in [place] both low and regular dose (see electronic order).   Let her know that we have no way to know what is cheapest, but likely Qsymia would be the cheapest option.  The low dose 2 week starter should be free, then change to the regular dose after 2 weeks.  I want to see her back in 4 wk after starting the medication.

## 2015-02-04 NOTE — Telephone Encounter (Signed)
error 

## 2015-02-05 ENCOUNTER — Telehealth: Payer: Self-pay

## 2015-02-05 MED ORDER — PHENTERMINE-TOPIRAMATE ER 3.75-23 MG PO CP24: 1.0000 | ORAL_CAPSULE | ORAL | Status: DC

## 2015-02-05 MED ORDER — PHENTERMINE-TOPIRAMATE ER 7.5-46 MG PO CP24: 1.0000 | ORAL_CAPSULE | ORAL | Status: DC

## 2015-02-05 NOTE — Telephone Encounter (Signed)
She hasn't gotten a call for those meds yet, but will check on them

## 2015-02-05 NOTE — Telephone Encounter (Signed)
error 

## 2015-02-05 NOTE — Telephone Encounter (Signed)
Disregard the error in the last message that was a mistake. The Qsymia was called in on 02/04/15. I called and left a message for the pt letting her know and to call us back to schedule a f/u

## 2015-02-05 NOTE — Telephone Encounter (Signed)
Printed prescription and faxing it over since couldn't get them on phone

## 2015-02-05 NOTE — Telephone Encounter (Signed)
Pt called back and she is scheduled for 03/08/15

## 2015-02-05 NOTE — Telephone Encounter (Signed)
Make sure we called out the starter and regular dose, 2 different Qsymia doses as in chart

## 2015-03-08 ENCOUNTER — Ambulatory Visit (INDEPENDENT_AMBULATORY_CARE_PROVIDER_SITE_OTHER): Payer: 59 | Admitting: Medical

## 2015-03-08 ENCOUNTER — Encounter: Payer: Self-pay | Admitting: Medical

## 2015-03-08 VITALS — BP 120/88 | HR 88 | Ht 64.0 in | Wt 266.0 lb

## 2015-03-08 DIAGNOSIS — E669 Obesity, unspecified: Secondary | ICD-10-CM | POA: Diagnosis not present

## 2015-03-08 DIAGNOSIS — A749 Chlamydial infection, unspecified: Secondary | ICD-10-CM

## 2015-03-08 DIAGNOSIS — R3129 Other microscopic hematuria: Secondary | ICD-10-CM

## 2015-03-08 DIAGNOSIS — R7301 Impaired fasting glucose: Secondary | ICD-10-CM | POA: Diagnosis not present

## 2015-03-08 LAB — POCT URINALYSIS DIPSTICK
BILIRUBIN UA: NEGATIVE
GLUCOSE UA: NEGATIVE
Ketones, UA: NEGATIVE
LEUKOCYTES UA: NEGATIVE
NITRITE UA: NEGATIVE
PH UA: 6
Protein, UA: NEGATIVE
Spec Grav, UA: >=1.03
Urobilinogen, UA: NEGATIVE

## 2015-03-08 LAB — POCT UA - MICROSCOPIC ONLY: RBC, URINE, MICROSCOPIC: POSITIVE

## 2015-03-08 MED ORDER — NALTREXONE-BUPROPION HCL ER 8-90 MG PO TB12: 2.0000 | ORAL_TABLET | Freq: Two times a day (BID) | ORAL | Status: DC

## 2015-03-08 NOTE — Progress Notes (Signed)
Subjective: Chief Complaint  Patient presents with  . Follow-up    qsymia, said she stopped taking it. said her ears and nose got acne and throat was swelling.    Here for f/u.  I saw her recently for physical and weight loss.  At her physical her labs showed microscopic hematuria and +chlamydia.  She took the Azithromycin . Here today symptom free to recheck on chlamydia and hematuria on UA.   She is not on her periods today, doing fine.  Since last visit used the initial 2 week Qsymia script.  Felt throat irritations like swollen throat so she didn't start the regular month pack, only the 2 wk starter.    She wants to try something else.  Kirke Shaggy was too expensive.    No other new c/o.   Past Medical History  Diagnosis Date  . Abnormal Pap smear 2005    paps normal 2007, 2008, 2009, 2010, 2013, 2014  . Obesity   . Herpes gingivostomatitis   . Liver hemangioma 2008    per ultrasound and 02/2013 MRI abdomen  . History of EKG 02/09/09    normal  . History of pregnancy     5 pregnancies, 2 live births, 3 TABs  . Impaired fasting blood sugar 2015  . Back pain    ROS as in subjective   Objective: BP 120/88 mmHg  Pulse 88  Ht 5\' 4"  (1.626 m)  Wt 266 lb (120.657 kg)  BMI 45.64 kg/m2  Wt Readings from Last 3 Encounters:  03/08/15 266 lb (120.657 kg)  12/25/14 268 lb (121.564 kg)  02/05/14 267 lb (121.11 kg)   General appearance: alert, no distress, WD/WN Neck: supple, no lymphadenopathy, no thyromegaly, no masses Pulses: 2+ symmetric, upper and lower extremities, normal cap refill Heart: RRR, normal S1, S2, no murmurs HENT unremarkable    Assessment: Encounter Diagnoses  Name Primary?  . Impaired fasting blood sugar Yes  . Obesity   . Positive Chlamyida test   . Microscopic hematuria     Plan: Recheck urine today for chlamydia,  And she did take treatment as well as her partner.   Recheck urine today for microscopic hematuria, asymptomatic.    Urine shows 1+  blood. Urine micro shows no RBC, so false +. Obesity, impaired glucose - will hold off on Qsymia for now given possible adverse reaction.  Kirke Shaggy was too expensive.  Begin trial of Contrave, discussed risks/benefits of medication.   C/t lifestyle changes we discussed, and advised she hire a Physiological scientist for the next 68months.   Gave contact info for trainer.

## 2015-03-08 NOTE — Patient Instructions (Signed)
Personal Trainer  [facility] [address],  [place], [place] [address] [phone] [email or web address]

## 2015-03-09 LAB — GC/CHLAMYDIA PROBE AMP
CT Probe RNA: NOT DETECTED
GC PROBE AMP APTIMA: NOT DETECTED

## 2015-03-21 HISTORY — PX: INTRAUTERINE DEVICE (IUD) INSERTION: SHX5877

## 2015-06-22 ENCOUNTER — Encounter: Payer: Self-pay | Admitting: Medical

## 2015-06-22 ENCOUNTER — Ambulatory Visit (INDEPENDENT_AMBULATORY_CARE_PROVIDER_SITE_OTHER): Payer: BLUE CROSS/BLUE SHIELD | Admitting: Medical

## 2015-06-22 DIAGNOSIS — M62838 Other muscle spasm: Secondary | ICD-10-CM

## 2015-06-22 DIAGNOSIS — M549 Dorsalgia, unspecified: Secondary | ICD-10-CM | POA: Diagnosis not present

## 2015-06-22 DIAGNOSIS — R030 Elevated blood-pressure reading, without diagnosis of hypertension: Secondary | ICD-10-CM | POA: Diagnosis not present

## 2015-06-22 DIAGNOSIS — IMO0001 Reserved for inherently not codable concepts without codable children: Secondary | ICD-10-CM

## 2015-06-22 DIAGNOSIS — M542 Cervicalgia: Secondary | ICD-10-CM | POA: Diagnosis not present

## 2015-06-22 MED ORDER — METHOCARBAMOL 500 MG PO TABS: 500.0000 mg | ORAL_TABLET | Freq: Four times a day (QID) | ORAL | Status: DC
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
## 2015-07-13 ENCOUNTER — Ambulatory Visit (INDEPENDENT_AMBULATORY_CARE_PROVIDER_SITE_OTHER): Payer: BLUE CROSS/BLUE SHIELD | Admitting: Medical

## 2015-07-13 ENCOUNTER — Encounter: Payer: Self-pay | Admitting: Medical

## 2015-07-13 DIAGNOSIS — M549 Dorsalgia, unspecified: Secondary | ICD-10-CM

## 2015-07-13 DIAGNOSIS — I10 Essential (primary) hypertension: Secondary | ICD-10-CM | POA: Diagnosis not present

## 2015-07-13 DIAGNOSIS — E669 Obesity, unspecified: Secondary | ICD-10-CM

## 2015-07-13 DIAGNOSIS — M62838 Other muscle spasm: Secondary | ICD-10-CM | POA: Diagnosis not present

## 2015-07-13 DIAGNOSIS — M542 Cervicalgia: Secondary | ICD-10-CM | POA: Diagnosis not present

## 2015-07-13 NOTE — Patient Instructions (Signed)
DASH Eating Plan DASH stands for "Dietary Approaches to Stop Hypertension." The DASH eating plan is a healthy eating plan that has been shown to reduce high blood pressure (hypertension). Additional health benefits may include reducing the risk of type 2 diabetes mellitus, heart disease, and stroke. The DASH eating plan may also help with weight loss. WHAT DO I NEED TO KNOW ABOUT THE DASH EATING PLAN? For the DASH eating plan, you will follow these general guidelines:  Choose foods with a percent daily value for sodium of less than 5% (as listed on the food label).  Use salt-free seasonings or herbs instead of table salt or sea salt.  Check with your health care provider or pharmacist before using salt substitutes.  Eat lower-sodium products, often labeled as "lower sodium" or "no salt added."  Eat fresh foods.  Eat more vegetables, fruits, and low-fat dairy products.  Choose whole grains. Look for the word "whole" as the first word in the ingredient list.  Choose fish and skinless chicken or turkey more often than red meat. Limit fish, poultry, and meat to 6 oz (170 g) each day.  Limit sweets, desserts, sugars, and sugary drinks.  Choose heart-healthy fats.  Limit cheese to 1 oz (28 g) per day.  Eat more home-cooked food and less restaurant, buffet, and fast food.  Limit fried foods.  Cook foods using methods other than frying.  Limit canned vegetables. If you do use them, rinse them well to decrease the sodium.  When eating at a restaurant, ask that your food be prepared with less salt, or no salt if possible. WHAT FOODS CAN I EAT? Seek help from a dietitian for individual calorie needs. Grains Whole grain or whole wheat bread. Brown rice. Whole grain or whole wheat pasta. Quinoa, bulgur, and whole grain cereals. Low-sodium cereals. Corn or whole wheat flour tortillas. Whole grain cornbread. Whole grain crackers. Low-sodium crackers. Vegetables Fresh or frozen vegetables  (raw, steamed, roasted, or grilled). Low-sodium or reduced-sodium tomato and vegetable juices. Low-sodium or reduced-sodium tomato sauce and paste. Low-sodium or reduced-sodium canned vegetables.  Fruits All fresh, canned (in natural juice), or frozen fruits. Meat and Other Protein Products Ground beef (85% or leaner), grass-fed beef, or beef trimmed of fat. Skinless chicken or turkey. Ground chicken or turkey. Pork trimmed of fat. All fish and seafood. Eggs. Dried beans, peas, or lentils. Unsalted nuts and seeds. Unsalted canned beans. Dairy Low-fat dairy products, such as skim or 1% milk, 2% or reduced-fat cheeses, low-fat ricotta or cottage cheese, or plain low-fat yogurt. Low-sodium or reduced-sodium cheeses. Fats and Oils Tub margarines without trans fats. Light or reduced-fat mayonnaise and salad dressings (reduced sodium). Avocado. Safflower, olive, or canola oils. Natural peanut or almond butter. Other Unsalted popcorn and pretzels. The items listed above may not be a complete list of recommended foods or beverages. Contact your dietitian for more options. WHAT FOODS ARE NOT RECOMMENDED? Grains White bread. White pasta. White rice. Refined cornbread. Bagels and croissants. Crackers that contain trans fat. Vegetables Creamed or fried vegetables. Vegetables in a cheese sauce. Regular canned vegetables. Regular canned tomato sauce and paste. Regular tomato and vegetable juices. Fruits Dried fruits. Canned fruit in light or heavy syrup. Fruit juice. Meat and Other Protein Products Fatty cuts of meat. Ribs, chicken wings, bacon, sausage, bologna, salami, chitterlings, fatback, hot dogs, bratwurst, and packaged luncheon meats. Salted nuts and seeds. Canned beans with salt. Dairy Whole or 2% milk, cream, half-and-half, and cream cheese. Whole-fat or sweetened yogurt. Full-fat   cheeses or blue cheese. Nondairy creamers and whipped toppings. Processed cheese, cheese spreads, or cheese  curds. Condiments Onion and garlic salt, seasoned salt, table salt, and sea salt. Canned and packaged gravies. Worcestershire sauce. Tartar sauce. Barbecue sauce. Teriyaki sauce. Soy sauce, including reduced sodium. Steak sauce. Fish sauce. Oyster sauce. Cocktail sauce. Horseradish. Ketchup and mustard. Meat flavorings and tenderizers. Bouillon cubes. Hot sauce. Tabasco sauce. Marinades. Taco seasonings. Relishes. Fats and Oils Butter, stick margarine, lard, shortening, ghee, and bacon fat. Coconut, palm kernel, or palm oils. Regular salad dressings. Other Pickles and olives. Salted popcorn and pretzels. The items listed above may not be a complete list of foods and beverages to avoid. Contact your dietitian for more information. WHERE CAN I FIND MORE INFORMATION? National Heart, Lung, and Blood Institute: travelstabloid.com Document Released: 02/23/2011 Document Revised: 07/21/2013 Document Reviewed: 01/08/2013 [facility] Patient Information 2015 [place], [place]. This information is not intended to replace advice given to you by your health care provider. Make sure you discuss any questions you have with your health care provider.        Why follow it? Research shows. . Those who follow the Mediterranean diet have a reduced risk of heart disease  . The diet is associated with a reduced incidence of Parkinson's and Alzheimer's diseases . People following the diet may have longer life expectancies and lower rates of chronic diseases  . The Dietary Guidelines for Americans recommends the Mediterranean diet as an eating plan to promote health and prevent disease  What Is the Mediterranean Diet?  . Healthy eating plan based on typical foods and recipes of Mediterranean-style cooking . The diet is primarily a plant based diet; these foods should make up a majority of meals   Starches - Plant based foods should make up a majority of meals - They are an important  sources of vitamins, minerals, energy, antioxidants, and fiber - Choose whole grains, foods high in fiber and minimally processed items  - Typical grain sources include wheat, oats, barley, corn, brown rice, bulgar, farro, millet, polenta, couscous  - Various types of beans include chickpeas, lentils, fava beans, black beans, white beans   Fruits  Veggies - Large quantities of antioxidant rich fruits & veggies; 6 or more servings  - Vegetables can be eaten raw or lightly drizzled with oil and cooked  - Vegetables common to the traditional Mediterranean Diet include: artichokes, arugula, beets, broccoli, brussel sprouts, cabbage, carrots, celery, collard greens, cucumbers, eggplant, kale, leeks, lemons, lettuce, mushrooms, okra, onions, peas, peppers, potatoes, pumpkin, radishes, rutabaga, shallots, spinach, sweet potatoes, turnips, zucchini - Fruits common to the Mediterranean Diet include: apples, apricots, avocados, cherries, clementines, dates, figs, grapefruits, grapes, melons, nectarines, oranges, peaches, pears, pomegranates, strawberries, tangerines  Fats - Replace butter and margarine with healthy oils, such as olive oil, canola oil, and tahini  - Limit nuts to no more than a handful a day  - Nuts include walnuts, almonds, pecans, pistachios, pine nuts  - Limit or avoid candied, honey roasted or heavily salted nuts - Olives are central to the [employer] - can be eaten whole or used in a variety of dishes   Meats Protein - Limiting red meat: no more than a few times a month - When eating red meat: choose lean cuts and keep the portion to the size of deck of cards - Eggs: approx. 0 to 4 times a week  - Fish and lean poultry: at least 2 a week  - Healthy protein sources include,  chicken, [place], lean beef, lamb - Increase intake of seafood such as tuna, salmon, trout, mackerel, shrimp, scallops - Avoid or limit high fat processed meats such as sausage and bacon  Dairy - Include  moderate amounts of low fat dairy products  - Focus on healthy dairy such as fat free yogurt, skim milk, low or reduced fat cheese - Limit dairy products higher in fat such as whole or 2% milk, cheese, ice cream  Alcohol - Moderate amounts of red wine is ok  - No more than 5 oz daily for women (all ages) and men older than age 51  - No more than 10 oz of wine daily for men younger than 70  Other - Limit sweets and other desserts  - Use herbs and spices instead of salt to flavor foods  - Herbs and spices common to the traditional Mediterranean Diet include: basil, bay leaves, chives, cloves, cumin, fennel, garlic, lavender, marjoram, mint, oregano, parsley, pepper, rosemary, sage, savory, sumac, tarragon, thyme   It's not just a diet, it's a lifestyle:  . The Mediterranean diet includes lifestyle factors typical of those in the region  . Foods, drinks and meals are best eaten with others and savored . Daily physical activity is important for overall good health . This could be strenuous exercise like running and aerobics . This could also be more leisurely activities such as walking, housework, yard-work, or taking the stairs . Moderation is the key; a balanced and healthy diet accommodates most foods and drinks . Consider portion sizes and frequency of consumption of certain foods   Meal Ideas & Options:  . Breakfast:  o Whole wheat toast or whole wheat English muffins with peanut butter & hard boiled egg o Steel cut oats topped with apples & cinnamon and skim milk  o Fresh fruit: banana, strawberries, melon, berries, peaches  o Smoothies: strawberries, bananas, greek yogurt, peanut butter o Low fat greek yogurt with blueberries and granola  o Egg white omelet with spinach and mushrooms o Breakfast couscous: whole wheat couscous, apricots, skim milk, cranberries  . Sandwiches:  o Hummus and grilled vegetables (peppers, zucchini, squash) on whole wheat bread   o Grilled chicken on whole  wheat pita with lettuce, tomatoes, cucumbers or tzatziki  o Tuna salad on whole wheat bread: tuna salad made with greek yogurt, olives, red peppers, capers, green onions o Garlic rosemary lamb pita: lamb sauted with garlic, rosemary, salt & pepper; add lettuce, cucumber, greek yogurt to pita - flavor with lemon juice and black pepper  . Seafood:  o Mediterranean grilled salmon, seasoned with garlic, basil, parsley, lemon juice and black pepper o Shrimp, lemon, and spinach whole-grain pasta salad made with low fat greek yogurt  o Seared scallops with lemon orzo  o Seared tuna steaks seasoned salt, pepper, coriander topped with tomato mixture of olives, tomatoes, olive oil, minced garlic, parsley, green onions and cappers  . Meats:  o Herbed greek chicken salad with kalamata olives, cucumber, feta  o Red bell peppers stuffed with spinach, bulgur, lean ground beef (or lentils) & topped with feta   o Kebabs: skewers of chicken, tomatoes, onions, zucchini, squash  o [place] burgers: made with red onions, mint, dill, lemon juice, feta cheese topped with roasted red peppers . Vegetarian o Cucumber salad: cucumbers, artichoke hearts, celery, red onion, feta cheese, tossed in olive oil & lemon juice  o Hummus and whole grain pita points with a greek salad (lettuce, tomato, feta, olives, cucumbers, red  onion) o Lentil soup with celery, carrots made with vegetable broth, garlic, salt and pepper  o Tabouli salad: parsley, bulgur, mint, scallions, cucumbers, tomato, radishes, lemon juice, olive oil, salt and pepper.
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
## 2015-07-14 ENCOUNTER — Encounter: Payer: Self-pay | Admitting: Medical

## 2015-07-19 DIAGNOSIS — M545 Low back pain: Secondary | ICD-10-CM | POA: Diagnosis not present

## 2015-07-19 DIAGNOSIS — M542 Cervicalgia: Secondary | ICD-10-CM | POA: Diagnosis not present

## 2015-07-21 DIAGNOSIS — M542 Cervicalgia: Secondary | ICD-10-CM | POA: Diagnosis not present

## 2015-07-21 DIAGNOSIS — M545 Low back pain: Secondary | ICD-10-CM | POA: Diagnosis not present

## 2015-07-22 ENCOUNTER — Encounter: Payer: Self-pay | Admitting: Medical

## 2015-07-22 ENCOUNTER — Ambulatory Visit (INDEPENDENT_AMBULATORY_CARE_PROVIDER_SITE_OTHER): Payer: BLUE CROSS/BLUE SHIELD | Admitting: Medical

## 2015-07-22 VITALS — BP 152/100 | HR 97 | Wt 269.0 lb

## 2015-07-22 DIAGNOSIS — I10 Essential (primary) hypertension: Secondary | ICD-10-CM | POA: Diagnosis not present

## 2015-07-22 DIAGNOSIS — N9489 Other specified conditions associated with female genital organs and menstrual cycle: Secondary | ICD-10-CM | POA: Diagnosis not present

## 2015-07-22 DIAGNOSIS — N938 Other specified abnormal uterine and vaginal bleeding: Secondary | ICD-10-CM | POA: Diagnosis not present

## 2015-07-22 DIAGNOSIS — N898 Other specified noninflammatory disorders of vagina: Secondary | ICD-10-CM | POA: Diagnosis not present

## 2015-07-22 NOTE — Progress Notes (Signed)
Subjective: Chief Complaint  Patient presents with  . Advice Only    urine obtained for std screening pt is on her cycle and it is heavy. does want std testing today. wants to change birth control because of how heavy and frequent her periods are. mentioned getting on depo   Having heavy bleeding the last 2 months.  Been on the same OCP the past 1.5 years without problem but all of the sudden having problems with bleeding.  Of note, she was + for chlamydia on the last physical  . Prior to 2 months ago was not having periods regularly at all, on extended cycle OCP.  However in the last 2 months having heavy irregular vagina bleeding.  Having clots.  Currently having vaginal bleeding x 2 days.   Prior to this was bleeding the end of March for about 5 days.  Prior to this had spotting, dark brown. No new sexual partners, same partner as usual.   Has some "normal" mild vaginal discharge.  Having some headaches.   No other change in diet or routine.  No fever.  No abdominal or back pain,no swelling.   She would like to go back to Depo Provera though.   No family hx/o gynecological cancer, no family or self hx/o fibroids.   Past Medical History  Diagnosis Date  . Abnormal Pap smear 2005    paps normal 2007, 2008, 2009, 2010, 2013, 2014  . Obesity   . Herpes gingivostomatitis   . Liver hemangioma 2008    per ultrasound and 02/2013 MRI abdomen  . History of EKG 02/09/09    normal  . History of pregnancy     5 pregnancies, 2 live births, 3 TABs  . Impaired fasting blood sugar 2015  . Back pain    Family History  Problem Relation Age of Onset  . Anesthesia problems Neg Hx   . Heart disease Neg Hx   . Diabetes Neg Hx   . Hypertension Neg Hx   . Hyperlipidemia Neg Hx   . Stroke Neg Hx   . Cancer Maternal Uncle     leukemia     Objective: BP 152/100 mmHg  Pulse 97  Wt 269 lb (122.018 kg)  LMP 07/21/2015  BP Readings from Last 3 Encounters:  07/22/15 152/100  07/13/15 160/108   06/22/15 170/110   Wt Readings from Last 3 Encounters:  07/22/15 269 lb (122.018 kg)  07/13/15 269 lb (122.018 kg)  06/22/15 268 lb (121.564 kg)   Gen: wd,wn, nad Abdomen: +bs, soft, nontender, no mass, no organomegaly Back: nontender Gyn - deferred given heavy bleeding    Assessment: Encounter Diagnoses  Name Primary?  . Dysfunctional uterine bleeding Yes  . Vaginal discharge   . Essential hypertension     Plan: discussed possible causes of her symptoms.   U pregnancy test and STD test today.  If negative, will send for ultrasound or refer to gyn.    HTN - c/t efforts with lifestyle changes, but she has agreed to go on medication in the next 2 months if not making progress with lifestyle changes.  Joanne Johns was seen today for advice only.  Diagnoses and all orders for this visit:  Dysfunctional uterine bleeding -     GC/Chlamydia Probe Amp -     POCT urine pregnancy -     Trichomonas vaginalis, RNA  Vaginal discharge -     GC/Chlamydia Probe Amp -     POCT urine pregnancy -  Trichomonas vaginalis, RNA  Essential hypertension

## 2015-07-23 ENCOUNTER — Telehealth: Payer: Self-pay

## 2015-07-23 DIAGNOSIS — N938 Other specified abnormal uterine and vaginal bleeding: Secondary | ICD-10-CM

## 2015-07-23 LAB — GC/CHLAMYDIA PROBE AMP
CT Probe RNA: NOT DETECTED
GC Probe RNA: NOT DETECTED

## 2015-07-23 LAB — TRICHOMONAS VAGINALIS, PROBE AMP: TRICHOMONAS VAGINALIS PROBE APTIMA: NEGATIVE

## 2015-07-23 NOTE — Telephone Encounter (Signed)
referral

## 2015-07-23 NOTE — Telephone Encounter (Signed)
-----   Message from Carlena Hurl, PA-C sent at 07/23/2015 12:34 PM EDT ----- The labs were all negative normal.  Thus depending upon her preference lets either set up for pelvic US or refer to gyn for evaluation of dysfunctional uterine bleeding.

## 2015-07-26 DIAGNOSIS — M545 Low back pain: Secondary | ICD-10-CM | POA: Diagnosis not present

## 2015-07-26 DIAGNOSIS — M542 Cervicalgia: Secondary | ICD-10-CM | POA: Diagnosis not present

## 2015-07-27 ENCOUNTER — Encounter: Payer: Self-pay | Admitting: Obstetrics and Gynecology

## 2015-07-27 ENCOUNTER — Ambulatory Visit (INDEPENDENT_AMBULATORY_CARE_PROVIDER_SITE_OTHER): Payer: BLUE CROSS/BLUE SHIELD | Admitting: Obstetrics and Gynecology

## 2015-07-27 ENCOUNTER — Telehealth: Payer: Self-pay | Admitting: Medical

## 2015-07-27 VITALS — BP 180/90 | HR 104 | Resp 18 | Ht 63.5 in | Wt 273.0 lb

## 2015-07-27 DIAGNOSIS — N946 Dysmenorrhea, unspecified: Secondary | ICD-10-CM

## 2015-07-27 DIAGNOSIS — R7303 Prediabetes: Secondary | ICD-10-CM

## 2015-07-27 DIAGNOSIS — Z113 Encounter for screening for infections with a predominantly sexual mode of transmission: Secondary | ICD-10-CM

## 2015-07-27 DIAGNOSIS — I10 Essential (primary) hypertension: Secondary | ICD-10-CM | POA: Diagnosis not present

## 2015-07-27 DIAGNOSIS — N926 Irregular menstruation, unspecified: Secondary | ICD-10-CM | POA: Diagnosis not present

## 2015-07-27 LAB — CBC
HCT: 39.9 % (ref 35.0–45.0)
HEMOGLOBIN: 13.2 g/dL (ref 11.7–15.5)
MCH: 29.7 pg (ref 27.0–33.0)
MCHC: 33.1 g/dL (ref 32.0–36.0)
MCV: 89.9 fL (ref 80.0–100.0)
MPV: 9.7 fL (ref 7.5–12.5)
Platelets: 333 10*3/uL (ref 140–400)
RBC: 4.44 MIL/uL (ref 3.80–5.10)
RDW: 13.8 % (ref 11.0–15.0)
WBC: 8.7 10*3/uL (ref 3.8–10.8)

## 2015-07-27 LAB — POCT URINE PREGNANCY: PREG TEST UR: NEGATIVE

## 2015-07-27 LAB — TSH: TSH: 1.66 m[IU]/L

## 2015-07-27 MED ORDER — NAPROXEN SODIUM 550 MG PO TABS: ORAL_TABLET | ORAL | Status: DC

## 2015-07-27 NOTE — Telephone Encounter (Signed)
Pt said she is ready to start BP med now. She said she was offered meds at last appt but she wanted to wait. She went to GYN appt today and BP was even higher than it was when she was at her appt in our office. She thinks it was 183/90

## 2015-07-27 NOTE — Progress Notes (Addendum)
Patient ID: Joanne Johns, female   DOB: 1975/07/14, 40 y.o.   MRN: [id number] 40 y.o. [place] sent for a consultation by Chana Bode, PA for c/o heavy menstrual cycles and dysmenorrhea on OCP's. The patient wasn't having anything but occasional spotting on the pill until April. She has had 2 cycles since then in the middle of her pack, not quite a month apart. She has bleed for 5 days each time, saturating a pad in 3 hours. Passing small clots. Cramps are bad, ibuprofen helped some. She does occasionally miss a pill, no change.  Not sexually active for several months. She has elevated blood pressure recently, has declined BP medication. She is having daily headaches. She is also pre-diabetic  Period Duration (Days): 5 days  Period Pattern: (!) Irregular Menstrual Flow: Heavy Menstrual Control: Maxi pad Menstrual Control Change Freq (Hours): changes pad every 3 hours  Dysmenorrhea: (!) Severe Dysmenorrhea Symptoms: Cramping, Headache  Patient's last menstrual period was 07/21/2015.          Sexually active: Yes.    The current method of family planning is OCP (estrogen/progesterone).    Exercising: No.  The patient does not participate in regular exercise at present. Smoker:  no  Health Maintenance: Pap:  12-25-14 WNL POSITIVE HR HPV and Chlamydia  History of abnormal Pap:  Yes  MMG:  N/A Colonoscopy:  N/A BMD:   N/A TDaP:  05-04-11 Gardasil: N/A   reports that she has never smoked. She has never used smokeless tobacco. She reports that she does not drink alcohol or use illicit drugs.  Past Medical History  Diagnosis Date  . Abnormal Pap smear 2005    paps normal 2007, 2008, 2009, 2010, 2013, 2014  . Obesity   . Herpes gingivostomatitis   . Liver hemangioma 2008    per ultrasound and 02/2013 MRI abdomen  . History of EKG 02/09/09    normal  . History of pregnancy     5 pregnancies, 2 live births, 3 TABs  . Impaired fasting blood sugar 2015  . Back  pain   . Hypertension     Past Surgical History  Procedure Laterality Date  . Knee surgery  2000    R knee, ACL, MCL meniscus  . Colposcopy  05/2003    Current Outpatient Prescriptions  Medication Sig Dispense Refill  . levonorgestrel-ethinyl estradiol (SEASONALE,INTROVALE,JOLESSA) 0.15-0.03 MG tablet Take 1 tablet by mouth daily. 1 Package 3   No current facility-administered medications for this visit.    Family History  Problem Relation Age of Onset  . Anesthesia problems Neg Hx   . Heart disease Neg Hx   . Diabetes Neg Hx   . Hypertension Neg Hx   . Hyperlipidemia Neg Hx   . Stroke Neg Hx   . Cancer Maternal Uncle     leukemia    Review of Systems  Constitutional: Negative.   HENT: Negative.   Eyes: Negative.   Respiratory: Negative.   Cardiovascular: Negative.   Gastrointestinal: Negative.   Endocrine: Negative.   Genitourinary: Positive for menstrual problem.       Heavy menstrual cycles  Dysmenorrhea    Musculoskeletal: Negative.   Skin: Negative.   Allergic/Immunologic: Negative.   Neurological: Negative.   Psychiatric/Behavioral: Negative.     Exam:   BP 180/90 mmHg  Pulse 104  Resp 18  Ht 5' 3.5" (1.613 m)  Wt 273 lb (123.832 kg)  BMI 47.60 kg/m2  LMP 07/21/2015  Weight change: @WEIGHTCHANGE @ Height:  Height: 5' 3.5" (161.3 cm)  Ht Readings from Last 3 Encounters:  07/27/15 5' 3.5" (1.613 m)  03/08/15 5\' 4"  (1.626 m)  12/25/14 5\' 4"  (1.626 m)    General appearance: alert, cooperative and appears stated age Head: Normocephalic, without obvious abnormality, atraumatic Neck: no adenopathy, supple, symmetrical, trachea midline and thyroid normal to inspection and palpation Lungs: clear to auscultation bilaterally Heart: regular rate and rhythm Abdomen: soft, non-tender; bowel sounds normal; no masses,  no organomegaly Extremities: extremities normal, atraumatic, no cyanosis or edema Skin: Skin color, texture, turgor normal. No rashes or  lesions Lymph nodes: Cervical, supraclavicular, and axillary nodes normal. No abnormal inguinal nodes palpated Neurologic: Grossly normal   Pelvic: External genitalia:  no lesions              Urethra:  normal appearing urethra with no masses, tenderness or lesions              Bartholins and Skenes: normal                 Vagina: normal appearing vagina with normal color and discharge, no lesions              Cervix: no lesions               Bimanual Exam:  Uterus:  normal size, contour, position, consistency, mobility, non-tender              Adnexa: no mass, fullness, tenderness               Rectovaginal: Confirms               Anus:  normal sphincter tone, no lesions  Chaperone was present for exam.  A:  Recent onset of heavy bleeding on OCP's  Severe dysmenorrhea  Hypertension, discussed the long term risks of HTN  Obesity  P:   Her bleeding sounds heavy, but not excessive. Will check a CBC and TSH  She needs to stop OCP's. Discussed that she should not be on OCP's with HTN  Strongly encouraged her to let her primary start her on antihypertensive medication  Calendar bleeding, return for review in 2 months.   If she is anemic, or bleeding gets heavier, would have her return for an ultrasound  She would be a candidate for a progesterone only method of contraception, given information the mirena IUD. I would not recommend the depo-provera for her given the risk of weight gain  Anaprox for cramps  Recommended life style changes and weight loss   CC: Chana Bode, PA Letter sent

## 2015-07-27 NOTE — Addendum Note (Signed)
Addended by: Susanne Greenhouse E on: 07/27/2015 03:09 PM   Modules accepted: Orders

## 2015-07-27 NOTE — Patient Instructions (Signed)
Hypertension Hypertension, commonly called high blood pressure, is when the force of blood pumping through your arteries is too strong. Your arteries are the blood vessels that carry blood from your heart throughout your body. A blood pressure reading consists of a higher number over a lower number, such as 110/72. The higher number (systolic) is the pressure inside your arteries when your heart pumps. The lower number (diastolic) is the pressure inside your arteries when your heart relaxes. Ideally you want your blood pressure below 120/80. Hypertension forces your heart to work harder to pump blood. Your arteries may become narrow or stiff. Having untreated or uncontrolled hypertension can cause heart attack, stroke, kidney disease, and other problems. RISK FACTORS Some risk factors for high blood pressure are controllable. Others are not.  Risk factors you cannot control include:   Race. You may be at higher risk if you are African American.  Age. Risk increases with age.  Gender. Men are at higher risk than women before age 45 years. After age 65, women are at higher risk than men. Risk factors you can control include:  Not getting enough exercise or physical activity.  Being overweight.  Getting too much fat, sugar, calories, or salt in your diet.  Drinking too much alcohol. SIGNS AND SYMPTOMS Hypertension does not usually cause signs or symptoms. Extremely high blood pressure (hypertensive crisis) may cause headache, anxiety, shortness of breath, and nosebleed. DIAGNOSIS To check if you have hypertension, your health care provider will measure your blood pressure while you are seated, with your arm held at the level of your heart. It should be measured at least twice using the same arm. Certain conditions can cause a difference in blood pressure between your right and left arms. A blood pressure reading that is higher than normal on one occasion does not mean that you need treatment. If  it is not clear whether you have high blood pressure, you may be asked to return on a different day to have your blood pressure checked again. Or, you may be asked to monitor your blood pressure at home for 1 or more weeks. TREATMENT Treating high blood pressure includes making lifestyle changes and possibly taking medicine. Living a healthy lifestyle can help lower high blood pressure. You may need to change some of your habits. Lifestyle changes may include:  Following the DASH diet. This diet is high in fruits, vegetables, and whole grains. It is low in salt, red meat, and added sugars.  Keep your sodium intake below 2,300 mg per day.  Getting at least 30-45 minutes of aerobic exercise at least 4 times per week.  Losing weight if necessary.  Not smoking.  Limiting alcoholic beverages.  Learning ways to reduce stress. Your health care provider may prescribe medicine if lifestyle changes are not enough to get your blood pressure under control, and if one of the following is true:  You are 18-59 years of age and your systolic blood pressure is above 140.  You are 60 years of age or older, and your systolic blood pressure is above 150.  Your diastolic blood pressure is above 90.  You have diabetes, and your systolic blood pressure is over 140 or your diastolic blood pressure is over 90.  You have kidney disease and your blood pressure is above 140/90.  You have heart disease and your blood pressure is above 140/90. Your personal target blood pressure may vary depending on your medical conditions, your age, and other factors. HOME CARE INSTRUCTIONS    Have your blood pressure rechecked as directed by your health care provider.   Take medicines only as directed by your health care provider. Follow the directions carefully. Blood pressure medicines must be taken as prescribed. The medicine does not work as well when you skip doses. Skipping doses also puts you at risk for  problems.  Do not smoke.   Monitor your blood pressure at home as directed by your health care provider. SEEK MEDICAL CARE IF:   You think you are having a reaction to medicines taken.  You have recurrent headaches or feel dizzy.  You have swelling in your ankles.  You have trouble with your vision. SEEK IMMEDIATE MEDICAL CARE IF:  You develop a severe headache or confusion.  You have unusual weakness, numbness, or feel faint.  You have severe chest or abdominal pain.  You vomit repeatedly.  You have trouble breathing. MAKE SURE YOU:   Understand these instructions.  Will watch your condition.  Will get help right away if you are not doing well or get worse.   This information is not intended to replace advice given to you by your health care provider. Make sure you discuss any questions you have with your health care provider.   Document Released: 03/06/2005 Document Revised: 07/21/2014 Document Reviewed: 12/27/2012 Elsevier Interactive Patient Education 2016 Else[employer] Inc.  

## 2015-07-28 ENCOUNTER — Other Ambulatory Visit: Payer: Self-pay | Admitting: Medical

## 2015-07-28 DIAGNOSIS — M545 Low back pain: Secondary | ICD-10-CM | POA: Diagnosis not present

## 2015-07-28 DIAGNOSIS — M542 Cervicalgia: Secondary | ICD-10-CM | POA: Diagnosis not present

## 2015-07-28 LAB — HIV ANTIBODY (ROUTINE TESTING W REFLEX): HIV 1&2 Ab, 4th Generation: NONREACTIVE

## 2015-07-28 MED ORDER — AMLODIPINE BESYLATE 10 MG PO TABS: 10.0000 mg | ORAL_TABLET | Freq: Every day | ORAL | Status: DC

## 2015-07-28 NOTE — Telephone Encounter (Signed)
LMTCB

## 2015-07-28 NOTE — Telephone Encounter (Signed)
Pt is aware and will call back for appt.

## 2015-07-28 NOTE — Telephone Encounter (Signed)
Begin Amlodipine 10mg  daily, but start 1/2 tablet daily the first week, then go to 1 tablet daily.   Monitor BPs outside of here and write them down.  F/u 41mo.

## 2015-08-02 DIAGNOSIS — M542 Cervicalgia: Secondary | ICD-10-CM | POA: Diagnosis not present

## 2015-08-02 DIAGNOSIS — M545 Low back pain: Secondary | ICD-10-CM | POA: Diagnosis not present

## 2015-09-07 DIAGNOSIS — L298 Other pruritus: Secondary | ICD-10-CM | POA: Diagnosis not present

## 2015-09-07 DIAGNOSIS — R102 Pelvic and perineal pain: Secondary | ICD-10-CM | POA: Diagnosis not present

## 2015-09-07 DIAGNOSIS — Z113 Encounter for screening for infections with a predominantly sexual mode of transmission: Secondary | ICD-10-CM | POA: Diagnosis not present

## 2015-09-07 DIAGNOSIS — B379 Candidiasis, unspecified: Secondary | ICD-10-CM | POA: Diagnosis not present

## 2015-09-27 ENCOUNTER — Encounter: Payer: Self-pay | Admitting: Obstetrics and Gynecology

## 2015-09-27 ENCOUNTER — Ambulatory Visit: Payer: BLUE CROSS/BLUE SHIELD | Admitting: Obstetrics and Gynecology

## 2015-09-27 ENCOUNTER — Ambulatory Visit (INDEPENDENT_AMBULATORY_CARE_PROVIDER_SITE_OTHER): Payer: BLUE CROSS/BLUE SHIELD | Admitting: Obstetrics and Gynecology

## 2015-09-27 VITALS — BP 158/88 | HR 100 | Resp 16 | Wt 266.0 lb

## 2015-09-27 DIAGNOSIS — N946 Dysmenorrhea, unspecified: Secondary | ICD-10-CM | POA: Diagnosis not present

## 2015-09-27 DIAGNOSIS — Z3009 Encounter for other general counseling and advice on contraception: Secondary | ICD-10-CM

## 2015-09-27 DIAGNOSIS — N920 Excessive and frequent menstruation with regular cycle: Secondary | ICD-10-CM | POA: Diagnosis not present

## 2015-09-27 NOTE — Progress Notes (Addendum)
GYNECOLOGY  VISIT   HPI: 40 y.o.   Single  African American  female   [phone] with Patient's last menstrual period was 09/19/2015.   here for follow up of menstrual cycles. She is still having heavy, painful  Cycles. The patient was taken off of OCP's a couple of months ago secondary to HTN. She hasn't started BP medication as recommended by her primary. She states her BP has been normal at the bariatric clinic. Since she went off of OCP's cycles are q month x 5 days. Saturating a pad in up to 2 hours. No BTB. Cramps vary, can be very bad, anaprox helps.  Not sexually active at this time. She feels good.  She has a 36 year old son and a 107 year old daughter who is 5+ months pregnant (baby girl). Her daughter is living with her boyfriend. The patient is worried about her daughter having a baby so young.   GYNECOLOGIC HISTORY: Patient's last menstrual period was 09/19/2015. Contraception:none Menopausal hormone therapy: none         OB History    Gravida Para Term Preterm AB TAB SAB Ectopic Multiple Living   5 2 2  0 3 3 0 0 0 2         Patient Active Problem List   Diagnosis Date Noted  . Obesity 12/25/2014  . Screen for STD (sexually transmitted disease) 12/25/2014  . Screening for cervical cancer 12/25/2014  . Need for prophylactic vaccination and inoculation against influenza 12/25/2014  . Encounter for health maintenance examination in adult 12/25/2014  . Sleep disturbance 12/25/2014  . Pelvic pain in female 12/25/2014  . Encounter for surveillance of contraceptive pills 02/05/2014  . Impaired fasting blood sugar 02/05/2014    Past Medical History  Diagnosis Date  . Abnormal Pap smear 2005    paps normal 2007, 2008, 2009, 2010, 2013, 2014  . Obesity   . Herpes gingivostomatitis   . Liver hemangioma 2008    per ultrasound and 02/2013 MRI abdomen  . History of EKG 02/09/09    normal  . History of pregnancy     5 pregnancies, 2 live births, 3 TABs  . Impaired fasting blood  sugar 2015  . Back pain   . Hypertension     Past Surgical History  Procedure Laterality Date  . Knee surgery  2000    R knee, ACL, MCL meniscus  . Colposcopy  05/2003    Current Outpatient Prescriptions  Medication Sig Dispense Refill  . naproxen sodium (ANAPROX DS) 550 MG tablet 1 tab po q 12 hours prn pain 30 tablet 2  . phentermine 15 MG capsule Take by mouth.    Marland Kitchen amLODipine (NORVASC) 10 MG tablet Take 1 tablet (10 mg total) by mouth daily. (Patient not taking: Reported on 09/27/2015) 30 tablet 2   No current facility-administered medications for this visit.     ALLERGIES: Phentermine  Family History  Problem Relation Age of Onset  . Anesthesia problems Neg Hx   . Heart disease Neg Hx   . Diabetes Neg Hx   . Hypertension Neg Hx   . Hyperlipidemia Neg Hx   . Stroke Neg Hx   . Cancer Maternal Uncle     leukemia    Social History   Social History  . Marital Status: Single    Spouse Name: N/A  . Number of Children: N/A  . Years of Education: N/A   Occupational History  . 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 Smoking status: Never Smoker   . Smokeless tobacco: Never Used  . Alcohol Use: No  . Drug Use: No  . Sexual Activity:    Partners: Male    Birth Control/ Protection: Pill   Other Topics Concern  . Not on file   Social History Narrative   Single, relationship with son's father off and on x 3 years, 2 children, ages 70yo son and 57yo daughter.  Exercise - not much.  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    Review of Systems  Constitutional: Negative.   HENT: Negative.   Eyes: Negative.   Respiratory: Negative.   Cardiovascular: Negative.   Gastrointestinal: Negative.   Genitourinary:       Heavy/ painful menstrual cycle   Musculoskeletal: Negative.   Skin: Negative.   Neurological: Negative.   Endo/Heme/Allergies: Negative.   Psychiatric/Behavioral: Negative.      PHYSICAL EXAMINATION:    BP 158/88 mmHg  Pulse 100  Resp 16  Wt 266 lb (120.657 kg)  LMP 09/19/2015    General appearance: alert, cooperative and appears stated age  ASSESSMENT Heavy cycles, not anemic at last check Dysmenorrhea, helped with the anaprox    PLAN Discussed options of doing nothing, minipill, nexplanon and mirena She is interested in the [place] IUD, not sexually active for several months Return for mirena IUD insertion, side effects reviewed, information given   An After Visit Summary was printed and given to the patient.  Over 15 minutes face to face time of which over 50% was spent in counseling.    Addendum: patient also needs contraception.

## 2015-09-28 NOTE — Addendum Note (Signed)
Addended by: Dorothy Spark on: 09/28/2015 01:06 PM   Modules accepted: Orders

## 2015-09-29 ENCOUNTER — Encounter: Payer: Self-pay | Admitting: Obstetrics and Gynecology

## 2015-09-29 ENCOUNTER — Ambulatory Visit (INDEPENDENT_AMBULATORY_CARE_PROVIDER_SITE_OTHER): Payer: BLUE CROSS/BLUE SHIELD | Admitting: Obstetrics and Gynecology

## 2015-09-29 VITALS — BP 136/86 | HR 92 | Resp 15 | Wt 268.0 lb

## 2015-09-29 DIAGNOSIS — N946 Dysmenorrhea, unspecified: Secondary | ICD-10-CM | POA: Diagnosis not present

## 2015-09-29 DIAGNOSIS — Z30430 Encounter for insertion of intrauterine contraceptive device: Secondary | ICD-10-CM | POA: Diagnosis not present

## 2015-09-29 DIAGNOSIS — N920 Excessive and frequent menstruation with regular cycle: Secondary | ICD-10-CM | POA: Diagnosis not present

## 2015-09-29 DIAGNOSIS — Z3009 Encounter for other general counseling and advice on contraception: Secondary | ICD-10-CM

## 2015-09-29 DIAGNOSIS — Z01812 Encounter for preprocedural laboratory examination: Secondary | ICD-10-CM | POA: Diagnosis not present

## 2015-09-29 LAB — POCT URINE PREGNANCY: PREG TEST UR: NEGATIVE

## 2015-09-29 NOTE — Patient Instructions (Signed)

## 2015-09-29 NOTE — Progress Notes (Signed)
GYNECOLOGY  VISIT   HPI: 40 y.o.   Single  African American  female   [phone] with Patient's last menstrual period was 09/15/2015.   here for Mirena IUD insertion for contraception and for heavy, crampy cycles. She hasn't been sexually active for several months. Negative genprobe in 5/17.    GYNECOLOGIC HISTORY: Patient's last menstrual period was 09/15/2015. Contraception:None Menopausal hormone therapy: none         OB History    Gravida Para Term Preterm AB TAB SAB Ectopic Multiple Living   5 2 2  0 3 3 0 0 0 2         Patient Active Problem List   Diagnosis Date Noted  . Obesity 12/25/2014  . Screen for STD (sexually transmitted disease) 12/25/2014  . Screening for cervical cancer 12/25/2014  . Need for prophylactic vaccination and inoculation against influenza 12/25/2014  . Encounter for health maintenance examination in adult 12/25/2014  . Sleep disturbance 12/25/2014  . Pelvic pain in female 12/25/2014  . Encounter for surveillance of contraceptive pills 02/05/2014  . Impaired fasting blood sugar 02/05/2014    Past Medical History  Diagnosis Date  . Abnormal Pap smear 2005    paps normal 2007, 2008, 2009, 2010, 2013, 2014  . Obesity   . Herpes gingivostomatitis   . Liver hemangioma 2008    per ultrasound and 02/2013 MRI abdomen  . History of EKG 02/09/09    normal  . History of pregnancy     5 pregnancies, 2 live births, 3 TABs  . Impaired fasting blood sugar 2015  . Back pain   . Hypertension     Past Surgical History  Procedure Laterality Date  . Knee surgery  2000    R knee, ACL, MCL meniscus  . Colposcopy  05/2003    Current Outpatient Prescriptions  Medication Sig Dispense Refill  . amLODipine (NORVASC) 10 MG tablet Take 1 tablet (10 mg total) by mouth daily. 30 tablet 2  . naproxen sodium (ANAPROX DS) 550 MG tablet 1 tab po q 12 hours prn pain 30 tablet 2  . phentermine 15 MG capsule Take by mouth.     No current facility-administered  medications for this visit.     ALLERGIES: Phentermine  Family History  Problem Relation Age of Onset  . Anesthesia problems Neg Hx   . Heart disease Neg Hx   . Diabetes Neg Hx   . Hypertension Neg Hx   . Hyperlipidemia Neg Hx   . Stroke Neg Hx   . Cancer Maternal Uncle     leukemia    Social History   Social History  . Marital Status: Single    Spouse Name: N/A  . Number of Children: N/A  . Years of Education: N/A   Occupational History  . 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 Smoking status: Never Smoker   . Smokeless tobacco: Never Used  . Alcohol Use: No  . Drug Use: No  . Sexual Activity:    Partners: Male    Birth Control/ Protection: Pill   Other Topics Concern  . Not on file   Social History Narrative   Single, relationship with son's father off and on x 3 years, 2 children, ages 2yo son and 21yo daughter.  Exercise - not much.  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    Review of Systems  Constitutional: Negative.   HENT:  Negative.   Eyes: Negative.   Respiratory: Negative.   Cardiovascular: Negative.   Gastrointestinal: Negative.   Genitourinary: Negative.   Musculoskeletal: Negative.   Skin: Negative.   Neurological: Negative.   Endo/Heme/Allergies: Negative.   Psychiatric/Behavioral: Negative.     PHYSICAL EXAMINATION:    BP 136/86 mmHg  Pulse 92  Resp 15  Wt 268 lb (121.564 kg)  LMP 09/15/2015    General appearance: alert, cooperative and appears stated age  Pelvic: External genitalia:  no lesions              Urethra:  normal appearing urethra with no masses, tenderness or lesions              Bartholins and Skenes: normal                 Vagina: normal appearing vagina with normal color and discharge, no lesions              Cervix: no lesions                The risks of the mirena IUD were reviewed with the patient, including infection, abnormal  bleeding and uterine perfortion. Consent was signed.  A speculum was placed in the vagina, the cervix was cleansed with betadine. A tenaculum was placed on the cervix, the uterus sounded to 7 cm.  The mirena IUD was inserted without difficulty. The string were cut to 3-4 cm. The tenaculum was removed. Slight oozing from the tenaculum site was stopped with pressure.   The patient tolerated the procedure well.   Chaperone was present for exam.  ASSESSMENT Contraception Menorrhagia Dysmenorrhea    PLAN Mirena IUD inserted F/U in 1 month Call with any concerns   An After Visit Summary was printed and given to the patient.  CC: Chana Bode, PA

## 2015-10-27 ENCOUNTER — Telehealth: Payer: Self-pay | Admitting: Obstetrics and Gynecology

## 2015-10-27 NOTE — Telephone Encounter (Signed)
Patient canceled her 4 week reck appointment appointment 10/28/15 due to issue with son. Patient will call later to reschedule.

## 2015-10-28 ENCOUNTER — Ambulatory Visit: Payer: BLUE CROSS/BLUE SHIELD | Admitting: Obstetrics and Gynecology

## 2016-01-11 ENCOUNTER — Encounter: Payer: Self-pay | Admitting: Obstetrics and Gynecology

## 2016-02-07 ENCOUNTER — Encounter: Payer: Self-pay | Admitting: Medical

## 2016-02-07 ENCOUNTER — Ambulatory Visit (INDEPENDENT_AMBULATORY_CARE_PROVIDER_SITE_OTHER): Payer: BLUE CROSS/BLUE SHIELD | Admitting: Medical

## 2016-02-07 VITALS — BP 160/98 | HR 97 | Ht 65.0 in | Wt 275.4 lb

## 2016-02-07 DIAGNOSIS — R7301 Impaired fasting glucose: Secondary | ICD-10-CM | POA: Diagnosis not present

## 2016-02-07 DIAGNOSIS — Z0000 Encounter for general adult medical examination without abnormal findings: Secondary | ICD-10-CM | POA: Diagnosis not present

## 2016-02-07 DIAGNOSIS — E669 Obesity, unspecified: Secondary | ICD-10-CM | POA: Diagnosis not present

## 2016-02-07 DIAGNOSIS — Z113 Encounter for screening for infections with a predominantly sexual mode of transmission: Secondary | ICD-10-CM

## 2016-02-07 DIAGNOSIS — Z6841 Body Mass Index (BMI) 40.0 and over, adult: Secondary | ICD-10-CM | POA: Diagnosis not present

## 2016-02-07 DIAGNOSIS — Z975 Presence of (intrauterine) contraceptive device: Secondary | ICD-10-CM

## 2016-02-07 DIAGNOSIS — J988 Other specified respiratory disorders: Secondary | ICD-10-CM

## 2016-02-07 DIAGNOSIS — IMO0001 Reserved for inherently not codable concepts without codable children: Secondary | ICD-10-CM

## 2016-02-07 DIAGNOSIS — I10 Essential (primary) hypertension: Secondary | ICD-10-CM | POA: Insufficient documentation

## 2016-02-07 HISTORY — DX: Other specified respiratory disorders: J98.8

## 2016-02-07 LAB — POCT URINALYSIS DIPSTICK
Bilirubin, UA: NEGATIVE
GLUCOSE UA: NEGATIVE
Ketones, UA: NEGATIVE
LEUKOCYTES UA: NEGATIVE
Nitrite, UA: NEGATIVE
Protein, UA: NEGATIVE
UROBILINOGEN UA: NEGATIVE
pH, UA: 6

## 2016-02-07 NOTE — Progress Notes (Signed)
Subjective:   HPI  Joanne Johns is a 40 y.o. female who presents for a complete physical.  Concerns: Fatigue, tired a lot, single mom, works full time, going to school online, will finish in a year hopefully  Since last visit saw gynecology, had IUD placed.    Has had some recent cough, congestion, but no fever, no SOB, no NVD for the last 4-5 days.  Improving.  From last visit never started Amlodipine for BP  Reviewed their medical, surgical, family, social, medication, and allergy history and updated chart as appropriate.  Past Medical History:  Diagnosis Date  . Abnormal Pap smear 2005   paps normal 2007, 2008, 2009, 2010, 2013, 2014  . Back pain   . Herpes gingivostomatitis   . History of EKG 02/09/09   normal  . History of pregnancy    5 pregnancies, 2 live births, 3 TABs  . Hypertension   . Impaired fasting blood sugar 2015  . Liver hemangioma 2008   per ultrasound and 02/2013 MRI abdomen  . Obesity     Past Surgical History:  Procedure Laterality Date  . COLPOSCOPY  05/2003  . INTRAUTERINE DEVICE (IUD) INSERTION  2017   Mirena  . KNEE SURGERY  2000   R knee, ACL, MCL meniscus    Social History   Social History  . Marital status: Single    Spouse name: N/A  . Number of children: N/A  . Years of education: N/A   Occupational History  . 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 Smoking status: Never Smoker  . Smokeless tobacco: Never Used  . Alcohol use No  . Drug use: No  . Sexual activity: Yes    Partners: Male    Birth control/ protection: Pill   Other Topics Concern  . Not on file   Social History Narrative   Single, relationship with son's father off and on x 3 years, 2 children, ages 37yo son and 28yo daughter.  Has 1st granddaughter.  Exercise - not much.  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    Family History  Problem Relation Age of Onset  . Cancer Maternal  Uncle     leukemia  . Tuberculosis Father   . Diabetes Father   . Hypertension Father   . Anesthesia problems Neg Hx   . Heart disease Neg Hx   . Hyperlipidemia Neg Hx   . Stroke Neg Hx     No current outpatient prescriptions on file.  Allergies  Allergen Reactions  . Phentermine     Heart palpitations    Review of Systems Constitutional: -fever, -chills, -sweats, -unexpected weight change, -decreased appetite, +fatigue Allergy: -sneezing, -itching, -congestion Dermatology: -changing moles, --rash, -lumps ENT: -runny nose, -ear pain, -sore throat, -hoarseness, -sinus pain, -teeth pain, - ringing in ears, -hearing loss, -nosebleeds Cardiology: -chest pain, -palpitations, -swelling, -difficulty breathing when lying flat, -waking up short of breath Respiratory: -cough, -shortness of breath, -difficulty breathing with exercise or exertion, -wheezing, -coughing up blood Gastroenterology: +abdominal pain, -nausea, -vomiting, -diarrhea, -constipation, -blood in stool, -changes in bowel movement, -difficulty swallowing or eating Hematology: -bleeding, -bruising  Musculoskeletal: -joint aches, -muscle aches, -joint swelling, -back pain, -neck pain, -cramping, -changes in gait Ophthalmology: denies vision changes, eye redness, itching, discharge Urology: -burning with urination, -difficulty urinating, -blood in urine, -urinary frequency, -urgency, -incontinence Neurology: -headache, -weakness, -tingling, -numbness, -memory loss, -falls, -dizziness Psychology: -depressed mood, -  agitation, -sleep problems     Objective:   Physical Exam  BP (!) 160/98   Pulse 97   Ht 5\' 5"  (1.651 m)   Wt 275 lb 6.4 oz (124.9 kg)   LMP 09/13/2015   SpO2 99%   BMI 45.83 kg/m   General appearance: alert, no distress, WD/WN, obese AA female Skin: unremarkable, no worrisome lesions HEENT: normocephalic, conjunctiva/corneas normal, sclerae anicteric, PERRLA, EOMi, nares patent, no discharge or erythema,  pharynx normal Oral cavity: MMM, tongue normal, teeth in good repair Neck: supple, no lymphadenopathy, no thyromegaly, no masses, normal ROM, no bruits Chest: non tender, normal shape and expansion Heart: RRR, normal S1, S2, no murmurs Lungs: CTA bilaterally, no wheezes, rhonchi, or rales Abdomen: +bs, soft, non tender, non distended, no masses, no hepatomegaly, no splenomegaly, no bruits Back: non tender, normal ROM, no scoliosis Musculoskeletal: upper extremities non tender, no obvious deformity, normal ROM throughout, lower extremities non tender, no obvious deformity, normal ROM throughout Extremities: no edema, no cyanosis, no clubbing Pulses: 2+ symmetric, upper and lower extremities, normal cap refill Neurological: alert, oriented x 3, CN2-12 intact, strength normal upper extremities and lower extremities, sensation normal throughout, DTRs 2+ throughout, no cerebellar signs, gait normal Psychiatric: normal affect, behavior normal, pleasant  Breast/gyn - deferred to gynecology. Rectal: deferred   Adult ECG Report  Indication: HTN, physical  Rate: 90 bpm  Rhythm: normal sinus rhythm  QRS Axis: 56   PR Interval: 183ms  QRS Duration: 74 ms  QTc: 488ms  Conduction Disturbances: none  Other Abnormalities: T wave inversions II, III, V1-V6  Patient's cardiac risk factors are: hypertension, obesity (BMI >= 30 kg/m2) and sedentary lifestyle.  EKG comparison: none  Narrative Interpretation: abnormal EKG, possible ischemia    Assessment and Plan :    Encounter Diagnoses  Name Primary?  . Routine general medical examination at a health care facility Yes  . Essential hypertension   . Impaired fasting blood sugar   . Class 3 obesity with serious comorbidity and body mass index (BMI) of 40.0 to 44.9 in adult, unspecified obesity type ([place])   . Screen for STD (sexually transmitted disease)   . Respiratory tract infection   . IUD (intrauterine device) in place    Physical exam -  discussed healthy lifestyle, diet, exercise, preventative care, vaccinations, and addressed their concerns.   See dentist, eye doctor and gyn yearly Contraception - recent IUD per gynecology Obesity - discussed risks of obesity.  Counseled at length on lifestyle changes needed She was non fasting today.   She did some labs today and will return fasting within 7 days for Lipid, CMET. HTN - reviewed abnormal EKG, discussed diet/exercise, and begin the Amlodipine we discussed since last visit.  Plan recheck in 38mo, repeat EKG 56mo  Joanne Johns was seen today for physical.  Diagnoses and all orders for this visit:  Routine general medical examination at a health care facility -     Urinalysis Dipstick -     Hemoglobin A1c -     Microalbumin / creatinine urine ratio -     HIV antibody -     RPR -     GC/Chlamydia Probe Amp -     Comprehensive metabolic panel; Future -     Lipid panel; Future -     EKG 12-Lead  Essential hypertension -     Microalbumin / creatinine urine ratio -     Comprehensive metabolic panel; Future -     EKG 12-Lead  Impaired fasting blood sugar -     Hemoglobin A1c  Class 3 obesity with serious comorbidity and body mass index (BMI) of 40.0 to 44.9 in adult, unspecified obesity type ([place])  Screen for STD (sexually transmitted disease) -     HIV antibody -     RPR -     GC/Chlamydia Probe Amp  Respiratory tract infection  IUD (intrauterine device) in place  Follow-up pending labs

## 2016-02-08 LAB — GC/CHLAMYDIA PROBE AMP
CT PROBE, AMP APTIMA: NOT DETECTED
GC Probe RNA: NOT DETECTED

## 2016-02-08 LAB — MICROALBUMIN / CREATININE URINE RATIO
CREATININE, URINE: 139 mg/dL (ref 20–320)
Microalb Creat Ratio: 1 mcg/mg creat (ref <30)
Microalb, Ur: 0.2 mg/dL

## 2016-02-08 LAB — HIV ANTIBODY (ROUTINE TESTING W REFLEX): HIV: NONREACTIVE

## 2016-02-08 LAB — HEMOGLOBIN A1C
HEMOGLOBIN A1C: 5.5 % (ref <5.7)
Mean Plasma Glucose: 111 mg/dL

## 2016-02-08 LAB — RPR

## 2016-02-14 ENCOUNTER — Other Ambulatory Visit: Payer: BLUE CROSS/BLUE SHIELD

## 2016-02-14 DIAGNOSIS — Z0000 Encounter for general adult medical examination without abnormal findings: Secondary | ICD-10-CM

## 2016-02-14 DIAGNOSIS — I10 Essential (primary) hypertension: Secondary | ICD-10-CM | POA: Diagnosis not present

## 2016-02-14 LAB — COMPREHENSIVE METABOLIC PANEL
ALBUMIN: 4.1 g/dL (ref 3.6–5.1)
ALK PHOS: 57 U/L (ref 33–115)
ALT: 17 U/L (ref 6–29)
AST: 18 U/L (ref 10–30)
BILIRUBIN TOTAL: 0.4 mg/dL (ref 0.2–1.2)
BUN: 11 mg/dL (ref 7–25)
CALCIUM: 9 mg/dL (ref 8.6–10.2)
CO2: 24 mmol/L (ref 20–31)
Chloride: 105 mmol/L (ref 98–110)
Creat: 1 mg/dL (ref 0.50–1.10)
GLUCOSE: 74 mg/dL (ref 65–99)
Potassium: 4.3 mmol/L (ref 3.5–5.3)
Sodium: 137 mmol/L (ref 135–146)
TOTAL PROTEIN: 6.6 g/dL (ref 6.1–8.1)

## 2016-02-14 LAB — LIPID PANEL
CHOLESTEROL: 205 mg/dL — AB (ref <200)
HDL: 100 mg/dL (ref >50)
LDL Cholesterol: 92 mg/dL (ref <100)
Total CHOL/HDL Ratio: 2.1 Ratio (ref <5.0)
Triglycerides: 66 mg/dL (ref <150)
VLDL: 13 mg/dL (ref <30)

## 2016-02-15 ENCOUNTER — Other Ambulatory Visit: Payer: Self-pay | Admitting: Medical

## 2016-02-15 MED ORDER — AMLODIPINE BESYLATE 10 MG PO TABS: 10.0000 mg | ORAL_TABLET | Freq: Every day | ORAL | 1 refills | Status: DC

## 2016-03-20 DIAGNOSIS — B009 Herpesviral infection, unspecified: Secondary | ICD-10-CM

## 2016-03-20 HISTORY — DX: Herpesviral infection, unspecified: B00.9

## 2016-06-13 ENCOUNTER — Encounter: Payer: Self-pay | Admitting: Obstetrics and Gynecology

## 2016-06-13 ENCOUNTER — Ambulatory Visit (INDEPENDENT_AMBULATORY_CARE_PROVIDER_SITE_OTHER): Payer: Managed Care, Other (non HMO) | Admitting: Obstetrics and Gynecology

## 2016-06-13 ENCOUNTER — Telehealth: Payer: Self-pay | Admitting: Obstetrics and Gynecology

## 2016-06-13 VITALS — BP 170/80 | HR 84 | Resp 16 | Wt 267.0 lb

## 2016-06-13 DIAGNOSIS — N926 Irregular menstruation, unspecified: Secondary | ICD-10-CM | POA: Diagnosis not present

## 2016-06-13 DIAGNOSIS — T8332XA Displacement of intrauterine contraceptive device, initial encounter: Secondary | ICD-10-CM | POA: Diagnosis not present

## 2016-06-13 DIAGNOSIS — R102 Pelvic and perineal pain: Secondary | ICD-10-CM

## 2016-06-13 DIAGNOSIS — N898 Other specified noninflammatory disorders of vagina: Secondary | ICD-10-CM

## 2016-06-13 DIAGNOSIS — R1030 Lower abdominal pain, unspecified: Secondary | ICD-10-CM | POA: Diagnosis not present

## 2016-06-13 DIAGNOSIS — Z30431 Encounter for routine checking of intrauterine contraceptive device: Secondary | ICD-10-CM | POA: Diagnosis not present

## 2016-06-13 DIAGNOSIS — Z113 Encounter for screening for infections with a predominantly sexual mode of transmission: Secondary | ICD-10-CM | POA: Diagnosis not present

## 2016-06-13 LAB — POCT URINALYSIS DIPSTICK
Bilirubin, UA: NEGATIVE
Glucose, UA: NEGATIVE
KETONES UA: NEGATIVE
Nitrite, UA: NEGATIVE
PH UA: 5 (ref 5.0–8.0)
UROBILINOGEN UA: NEGATIVE (ref ?–2.0)

## 2016-06-13 LAB — POCT URINE PREGNANCY: PREG TEST UR: NEGATIVE

## 2016-06-13 NOTE — Progress Notes (Signed)
GYNECOLOGY  VISIT   HPI: 41 y.o.   Single  African American  female   [phone] with Patient's last menstrual period was 05/13/2016.   here c/o  abdominal pain and painful intercourse. She is also c/o BTB  The patient has a mirena IUD that was placed in 7/17 for contraception and heavy, crampy cycles.  She c/o intermittent pelvic/BLQ abdominal pain. The pain occurs every 1-2 week. The pain is achy/crampy. Naproxen takes the edge off and then it resolves. She has pain with intercourse one time, mostly okay. No regular menses. She does have spotting every 2-3 weeks, always dark, lasts for like 3 days. She went from OCP's to the [facility]. She has been on contraception for a long time. She is worried about STD's. She would like testing for STD's. Not using condoms.  She also c/o a several month history of an increase in vaginal discharge. No vulvitis symptoms, no odor. No urinary c/o  GYNECOLOGIC HISTORY: Patient's last menstrual period was 05/13/2016. Contraception:IUD  Menopausal hormone therapy: none         OB History    Gravida Para Term Preterm AB Living   5 2 2  0 3 2   SAB TAB Ectopic Multiple Live Births   0 3 0 0 2         Patient Active Problem List   Diagnosis Date Noted  . Routine general medical examination at a health care facility 02/07/2016  . Essential hypertension 02/07/2016  . Respiratory tract infection 02/07/2016  . IUD (intrauterine device) in place 02/07/2016  . Obesity 12/25/2014  . Screen for STD (sexually transmitted disease) 12/25/2014  . Screening for cervical cancer 12/25/2014  . Need for prophylactic vaccination and inoculation against influenza 12/25/2014  . Encounter for health maintenance examination in adult 12/25/2014  . Sleep disturbance 12/25/2014  . Pelvic pain in female 12/25/2014  . Encounter for surveillance of contraceptive pills 02/05/2014  . Impaired fasting blood sugar 02/05/2014    Past Medical History:  Diagnosis Date  . Abnormal Pap  smear 2005   paps normal 2007, 2008, 2009, 2010, 2013, 2014  . Back pain   . Herpes gingivostomatitis   . History of EKG 02/09/09   normal  . History of pregnancy    5 pregnancies, 2 live births, 3 TABs  . Hypertension   . Impaired fasting blood sugar 2015  . Liver hemangioma 2008   per ultrasound and 02/2013 MRI abdomen  . Obesity     Past Surgical History:  Procedure Laterality Date  . COLPOSCOPY  05/2003  . INTRAUTERINE DEVICE (IUD) INSERTION  2017   Mirena  . KNEE SURGERY  2000   R knee, ACL, MCL meniscus    Current Outpatient Prescriptions  Medication Sig Dispense Refill  . levonorgestrel (MIRENA) 20 MCG/24HR IUD 1 each by Intrauterine route once.    Marland Kitchen amLODipine (NORVASC) 10 MG tablet Take 1 tablet (10 mg total) by mouth daily. (Patient not taking: Reported on 06/13/2016) 90 tablet 1   No current facility-administered medications for this visit.      ALLERGIES: Phentermine  Family History  Problem Relation Age of Onset  . Cancer Maternal Uncle     leukemia  . Tuberculosis Father   . Diabetes Father   . Hypertension Father   . Anesthesia problems Neg Hx   . Heart disease Neg Hx   . Hyperlipidemia Neg Hx   . Stroke Neg Hx     Social History   Social History  .  Marital status: Single    Spouse name: N/A  . Number of children: N/A  . Years of education: N/A   Occupational History  . 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 Smoking status: Never Smoker  . Smokeless tobacco: Never Used  . Alcohol use No  . Drug use: No  . Sexual activity: Yes    Partners: Male    Birth control/ protection: Pill   Other Topics Concern  . Not on file   Social History Narrative   Single, relationship with son's father off and on x 3 years, 2 children, ages 42yo son and 65yo daughter.  Has 1st granddaughter.  Exercise - not much.  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    Review of Systems   Constitutional: Negative.   HENT: Negative.   Eyes: Negative.   Respiratory: Negative.   Cardiovascular: Negative.   Gastrointestinal: Positive for abdominal pain.  Genitourinary:       Painful intercourse  Break through bleeding   Musculoskeletal: Negative.   Skin: Negative.   Neurological: Negative.   Endo/Heme/Allergies: Negative.   Psychiatric/Behavioral: Negative.     PHYSICAL EXAMINATION:    BP (!) 170/80 (BP Location: Right Arm, Patient Position: Sitting, Cuff Size: Large)   Pulse 84   Resp 16   Wt 267 lb (121.1 kg)   LMP 05/13/2016   BMI 44.43 kg/m     General appearance: alert, cooperative and appears stated age Abdomen: soft, non-tender; bowel sounds normal; no masses,  no organomegaly  Pelvic: External genitalia:  no lesions              Urethra:  normal appearing urethra with no masses, tenderness or lesions              Bartholins and Skenes: normal                 Vagina: normal appearing vagina with normal color and discharge, no lesions              Cervix: no lesions and just the tip of the IUD string is seen, +/- cervical tenderness              Bimanual Exam:  Uterus:  uterus is anteverted, mildly tender, mobile.               Adnexa: no masses, mild bilateral tenderness   Bladder: tender to palpation                Chaperone was present for exam.  ASSESSMENT Intermittent pelvic pain, not necessarily associated with bleeding. Tender on exam IUD strings short STD screening Vaginal discharge    PLAN UPT negative STD screening Return for GYN ultrasound Vaginitis probe, treatment depending on results   An After Visit Summary was printed and given to the patient.  25 minutes face to face time of which over 50% was spent in counseling.

## 2016-06-13 NOTE — Telephone Encounter (Signed)
Called patient to review benefits for a recommended ultrasound. Left Voicemail requesting a call back. °

## 2016-06-14 ENCOUNTER — Other Ambulatory Visit: Payer: Self-pay | Admitting: *Deleted

## 2016-06-14 DIAGNOSIS — R1030 Lower abdominal pain, unspecified: Secondary | ICD-10-CM

## 2016-06-14 DIAGNOSIS — N926 Irregular menstruation, unspecified: Secondary | ICD-10-CM

## 2016-06-14 DIAGNOSIS — R102 Pelvic and perineal pain: Secondary | ICD-10-CM

## 2016-06-14 LAB — GC/CHLAMYDIA PROBE AMP
CT Probe RNA: NOT DETECTED
GC PROBE AMP APTIMA: NOT DETECTED

## 2016-06-14 LAB — STD PANEL
HIV: NONREACTIVE
Hepatitis B Surface Ag: NEGATIVE

## 2016-06-14 LAB — WET PREP BY MOLECULAR PROBE
Candida species: DETECTED — AB
Gardnerella vaginalis: NOT DETECTED
TRICHOMONAS VAG: NOT DETECTED

## 2016-06-14 LAB — HEPATITIS C ANTIBODY: HCV Ab: NEGATIVE

## 2016-06-14 NOTE — Telephone Encounter (Signed)
Patient returned call. Reviewed benefit . Patient understood and agreeable to benefit and arrival date and time. No further questions. Ok to close.

## 2016-06-15 ENCOUNTER — Encounter: Payer: Self-pay | Admitting: Obstetrics and Gynecology

## 2016-06-15 ENCOUNTER — Ambulatory Visit (INDEPENDENT_AMBULATORY_CARE_PROVIDER_SITE_OTHER): Payer: Managed Care, Other (non HMO) | Admitting: Obstetrics and Gynecology

## 2016-06-15 ENCOUNTER — Ambulatory Visit (INDEPENDENT_AMBULATORY_CARE_PROVIDER_SITE_OTHER): Payer: Managed Care, Other (non HMO)

## 2016-06-15 ENCOUNTER — Telehealth: Payer: Self-pay | Admitting: *Deleted

## 2016-06-15 VITALS — BP 148/90 | HR 84 | Resp 16 | Wt 267.0 lb

## 2016-06-15 DIAGNOSIS — Z30431 Encounter for routine checking of intrauterine contraceptive device: Secondary | ICD-10-CM

## 2016-06-15 DIAGNOSIS — N926 Irregular menstruation, unspecified: Secondary | ICD-10-CM | POA: Diagnosis not present

## 2016-06-15 DIAGNOSIS — R102 Pelvic and perineal pain: Secondary | ICD-10-CM

## 2016-06-15 DIAGNOSIS — R1030 Lower abdominal pain, unspecified: Secondary | ICD-10-CM

## 2016-06-15 MED ORDER — DOXYCYCLINE HYCLATE 100 MG PO CAPS: 100.0000 mg | ORAL_CAPSULE | Freq: Two times a day (BID) | ORAL | 0 refills | Status: DC

## 2016-06-15 MED ORDER — FLUCONAZOLE 150 MG PO TABS: 150.0000 mg | ORAL_TABLET | Freq: Once | ORAL | 0 refills | Status: AC

## 2016-06-15 NOTE — Progress Notes (Signed)
GYNECOLOGY  VISIT   HPI: 41 y.o.   Single  African American  female   [phone] with Patient's last menstrual period was 05/15/2016.   here for follow up pelvic pain. She has a mirena IUD, placed in 7/17 for contraception and crampy, heavy cycles.  Since IUD insertion she c/o intermittent BLQ abdominal/pelvic pain, it occures every 1-2 weeks. At an exam earlier this week she had uterine tenderness and only the tip of her IUD string was seen.  She had negative STD testing earlier this week. She was diagnosed with yeast, hasn't started the diflucan yet.    GYNECOLOGIC HISTORY: Patient's last menstrual period was 05/15/2016. Contraception: IUD  Menopausal hormone therapy: none         OB History    Gravida Para Term Preterm AB Living   5 2 2  0 3 2   SAB TAB Ectopic Multiple Live Births   0 3 0 0 2         Patient Active Problem List   Diagnosis Date Noted  . Routine general medical examination at a health care facility 02/07/2016  . Essential hypertension 02/07/2016  . Respiratory tract infection 02/07/2016  . IUD (intrauterine device) in place 02/07/2016  . Obesity 12/25/2014  . Screen for STD (sexually transmitted disease) 12/25/2014  . Screening for cervical cancer 12/25/2014  . Need for prophylactic vaccination and inoculation against influenza 12/25/2014  . Encounter for health maintenance examination in adult 12/25/2014  . Sleep disturbance 12/25/2014  . Pelvic pain in female 12/25/2014  . Encounter for surveillance of contraceptive pills 02/05/2014  . Impaired fasting blood sugar 02/05/2014    Past Medical History:  Diagnosis Date  . Abnormal Pap smear 2005   paps normal 2007, 2008, 2009, 2010, 2013, 2014  . Back pain   . Herpes gingivostomatitis   . History of EKG 02/09/09   normal  . History of pregnancy    5 pregnancies, 2 live births, 3 TABs  . Hypertension   . Impaired fasting blood sugar 2015  . Liver hemangioma 2008   per ultrasound and 02/2013 MRI  abdomen  . Obesity     Past Surgical History:  Procedure Laterality Date  . COLPOSCOPY  05/2003  . INTRAUTERINE DEVICE (IUD) INSERTION  2017   Mirena  . KNEE SURGERY  2000   R knee, ACL, MCL meniscus    Current Outpatient Prescriptions  Medication Sig Dispense Refill  . amLODipine (NORVASC) 10 MG tablet Take 1 tablet (10 mg total) by mouth daily. 90 tablet 1  . fluconazole (DIFLUCAN) 150 MG tablet Take 1 tablet (150 mg total) by mouth once. Repeat does in 72 if still symptomatic 1 tablet 0  . levonorgestrel (MIRENA) 20 MCG/24HR IUD 1 each by Intrauterine route once.     No current facility-administered medications for this visit.      ALLERGIES: Phentermine  Family History  Problem Relation Age of Onset  . Cancer Maternal Uncle     leukemia  . Tuberculosis Father   . Diabetes Father   . Hypertension Father   . Anesthesia problems Neg Hx   . Heart disease Neg Hx   . Hyperlipidemia Neg Hx   . Stroke Neg Hx     Social History   Social History  . Marital status: Single    Spouse name: N/A  . Number of children: N/A  . Years of education: N/A   Occupational History  . admin assistant     [employer]  Social History Main Topics  . Smoking status: Never Smoker  . Smokeless tobacco: Never Used  . Alcohol use No  . Drug use: No  . Sexual activity: Yes    Partners: Male    Birth control/ protection: Pill   Other Topics Concern  . Not on file   Social History Narrative   Single, relationship with son's father off and on x 3 years, 2 children, ages 55yo son and 79yo daughter.  Has 1st granddaughter.  Exercise - not much.  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    Review of Systems  Constitutional: Negative.   HENT: Negative.   Eyes: Negative.   Respiratory: Negative.   Cardiovascular: Negative.   Gastrointestinal: Negative.   Genitourinary: Negative.   Musculoskeletal: Negative.   Skin: Negative.    Neurological: Negative.   Endo/Heme/Allergies: Negative.   Psychiatric/Behavioral: Negative.     PHYSICAL EXAMINATION:    BP (!) 148/90 (BP Location: Right Arm, Patient Position: Sitting, Cuff Size: Large)   Pulse 84   Resp 16   Wt 267 lb (121.1 kg)   LMP 05/15/2016   BMI 44.43 kg/m     General appearance: alert, cooperative and appears stated age  ASSESSMENT Intermittent pelvic pain since IUD insertion, IUD in place on ultrasound. Normal pelvic ultrasound Tender with uterine palpation, possible endometritis, negative STD testing, negative UPT earlier this week    PLAN We discussed the option of removing the IUD She is not a good candidate for OCP's, could use the mini-pill Will treat with doxycyline for possible endometritis F/U in 2 weeks, sooner with concerns. Will check for uterine tenderness at her f/u visit   An After Visit Summary was printed and given to the patient.  10 minutes face to face time of which over 50% was spent in counseling.

## 2016-06-15 NOTE — Telephone Encounter (Signed)
Spoke with patient and gave results. Sent in RX for Diflucan. -eh

## 2016-06-15 NOTE — Telephone Encounter (Signed)
-----   Message from Salvadore Dom, MD sent at 06/14/2016  5:35 PM EDT ----- Please inform + for yeast, and treat with diflucan 150 mg x 1, may repeat in 72 hours if still symptomatic. #2, no refills The rest of her labs were normal.

## 2016-06-29 ENCOUNTER — Encounter: Payer: Self-pay | Admitting: Obstetrics and Gynecology

## 2016-06-29 ENCOUNTER — Ambulatory Visit (INDEPENDENT_AMBULATORY_CARE_PROVIDER_SITE_OTHER): Payer: Managed Care, Other (non HMO) | Admitting: Obstetrics and Gynecology

## 2016-06-29 VITALS — BP 138/70 | HR 84 | Resp 16 | Wt 269.0 lb

## 2016-06-29 DIAGNOSIS — N760 Acute vaginitis: Secondary | ICD-10-CM | POA: Diagnosis not present

## 2016-06-29 DIAGNOSIS — R102 Pelvic and perineal pain: Secondary | ICD-10-CM | POA: Diagnosis not present

## 2016-06-29 NOTE — Progress Notes (Signed)
GYNECOLOGY  VISIT   HPI: 41 y.o.   Single  African American  female   [phone] with Patient's last menstrual period was 06/19/2016.   here for  2 week recheck> The patient was seen on 06/15/16 with c/o intermittent pelvic pain since IUD insertion last summer. She had a normal pelvic ultrasound, but had uterine tenderness. Negative STD testing, negative UPT. She was treated doxycycline for possible endometritis. She feels better since taking the antibiotics. She c/o a couple of day h/o vaginal itching, no abnormal discharge. She was recently treated for yeast, but was also treated with doxycycline.  She had a couple of days of dark blood after she was last here.   GYNECOLOGIC HISTORY: Patient's last menstrual period was 06/19/2016. Contraception: Mirena IUD Menopausal hormone therapy: n/a        OB History    Gravida Para Term Preterm AB Living   5 2 2  0 3 2   SAB TAB Ectopic Multiple Live Births   0 3 0 0 2         Patient Active Problem List   Diagnosis Date Noted  . Routine general medical examination at a health care facility 02/07/2016  . Essential hypertension 02/07/2016  . Respiratory tract infection 02/07/2016  . IUD (intrauterine device) in place 02/07/2016  . Obesity 12/25/2014  . Screen for STD (sexually transmitted disease) 12/25/2014  . Screening for cervical cancer 12/25/2014  . Need for prophylactic vaccination and inoculation against influenza 12/25/2014  . Encounter for health maintenance examination in adult 12/25/2014  . Sleep disturbance 12/25/2014  . Pelvic pain in female 12/25/2014  . Encounter for surveillance of contraceptive pills 02/05/2014  . Impaired fasting blood sugar 02/05/2014    Past Medical History:  Diagnosis Date  . Abnormal Pap smear 2005   paps normal 2007, 2008, 2009, 2010, 2013, 2014  . Back pain   . Herpes gingivostomatitis   . History of EKG 02/09/09   normal  . History of pregnancy    5 pregnancies, 2 live births, 3 TABs  .  Hypertension   . Impaired fasting blood sugar 2015  . Liver hemangioma 2008   per ultrasound and 02/2013 MRI abdomen  . Obesity     Past Surgical History:  Procedure Laterality Date  . COLPOSCOPY  05/2003  . INTRAUTERINE DEVICE (IUD) INSERTION  2017   Mirena  . KNEE SURGERY  2000   R knee, ACL, MCL meniscus    Current Outpatient Prescriptions  Medication Sig Dispense Refill  . amLODipine (NORVASC) 10 MG tablet Take 1 tablet (10 mg total) by mouth daily. 90 tablet 1  . levonorgestrel (MIRENA) 20 MCG/24HR IUD 1 each by Intrauterine route once.     No current facility-administered medications for this visit.      ALLERGIES: Phentermine  Family History  Problem Relation Age of Onset  . Cancer Maternal Uncle     leukemia  . Tuberculosis Father   . Diabetes Father   . Hypertension Father   . Anesthesia problems Neg Hx   . Heart disease Neg Hx   . Hyperlipidemia Neg Hx   . Stroke Neg Hx     Social History   Social History  . Marital status: Single    Spouse name: N/A  . Number of children: N/A  . Years of education: N/A   Occupational History  . 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 Smoking status: Never Smoker  .  Smokeless tobacco: Never Used  . Alcohol use No  . Drug use: No  . Sexual activity: Yes    Partners: Male    Birth control/ protection: Pill   Other Topics Concern  . Not on file   Social History Narrative   Single, relationship with son's father off and on x 3 years, 2 children, ages 8yo son and 2yo daughter.  Has 1st granddaughter.  Exercise - not much.  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    Review of Systems  Constitutional: Negative.   HENT: Negative.   Eyes: Negative.   Respiratory: Negative.   Cardiovascular: Negative.   Gastrointestinal: Negative.   Genitourinary: Negative.   Musculoskeletal: Negative.   Skin: Negative.   Neurological: Negative.    Endo/Heme/Allergies: Negative.   Psychiatric/Behavioral: Negative.     PHYSICAL EXAMINATION:    BP 138/70 (BP Location: Right Arm, Patient Position: Sitting, Cuff Size: Large)   Pulse 84   Resp 16   Wt 269 lb (122 kg)   LMP 06/19/2016   BMI 44.76 kg/m     General appearance: alert, cooperative and appears stated age Abdomen: soft, non-tender; non distended; no masses,  no organomegaly  Pelvic: External genitalia:  no lesions              Urethra:  normal appearing urethra with no masses, tenderness or lesions              Bartholins and Skenes: normal                 Vagina: normal appearing vagina with normal color and discharge, no lesions              Cervix: no lesions              Bimanual Exam:  Uterus:  normal size, contour, position, consistency, mobility, non-tender and anteverted              Adnexa: no mass, fullness, tenderness              Bladder: mildly tender (no urinary c/o)  Chaperone was present for exam.  ASSESSMENT Pelvic pain, improved after treatment with doxycycline Vaginal itching, ? Residual yeast    PLAN Wet prep probe, will treat depending on results Call with recurrent pain F/U in the fall for an annual exam   An After Visit Summary was printed and given to the patient.

## 2016-06-30 LAB — WET PREP BY MOLECULAR PROBE
Candida species: NOT DETECTED
Gardnerella vaginalis: NOT DETECTED
Trichomonas vaginosis: NOT DETECTED

## 2016-09-02 ENCOUNTER — Other Ambulatory Visit: Payer: Self-pay | Admitting: Obstetrics and Gynecology

## 2016-10-10 ENCOUNTER — Ambulatory Visit (INDEPENDENT_AMBULATORY_CARE_PROVIDER_SITE_OTHER): Payer: Managed Care, Other (non HMO) | Admitting: Obstetrics and Gynecology

## 2016-10-10 ENCOUNTER — Telehealth: Payer: Self-pay | Admitting: Obstetrics and Gynecology

## 2016-10-10 ENCOUNTER — Encounter: Payer: Self-pay | Admitting: Obstetrics and Gynecology

## 2016-10-10 VITALS — BP 146/92 | HR 94 | Temp 98.8°F | Resp 18 | Ht 65.0 in | Wt 268.0 lb

## 2016-10-10 DIAGNOSIS — R102 Pelvic and perineal pain: Secondary | ICD-10-CM | POA: Diagnosis not present

## 2016-10-10 DIAGNOSIS — Z113 Encounter for screening for infections with a predominantly sexual mode of transmission: Secondary | ICD-10-CM

## 2016-10-10 LAB — POCT URINALYSIS DIPSTICK
Bilirubin, UA: NEGATIVE
Glucose, UA: NEGATIVE
Ketones, UA: NEGATIVE
Nitrite, UA: NEGATIVE
Protein, UA: NEGATIVE
Urobilinogen, UA: 0.2 E.U./dL
pH, UA: 5 (ref 5.0–8.0)

## 2016-10-10 LAB — POCT URINE PREGNANCY: Preg Test, Ur: NEGATIVE

## 2016-10-10 MED ORDER — SULFAMETHOXAZOLE-TRIMETHOPRIM 800-160 MG PO TABS: 1.0000 | ORAL_TABLET | Freq: Two times a day (BID) | ORAL | 0 refills | Status: DC

## 2016-10-10 NOTE — Telephone Encounter (Addendum)
Spoke with patient. Reports intermittent lower abdominal pain and pelvic discomfort, rates pain 5-6/10, started on 7/16. Self treated with diflucan x 1 on 7/16, no changes.    Denies vaginal discharge, nausea/vomiting, fever/chills.   Reports foul smelling urine, vaginal itching and spotting- has Mirena IUD in place.   No recent partner changes, is concerned may be STD.   Recommended OV for further evaluation. Advised patient will review schedule with nursing supervisor and return call. Patient is agreeable.

## 2016-10-10 NOTE — Patient Instructions (Signed)
Cold Sore A cold sore, also called a fever blister, is a skin infection that causes small, fluid-filled sores to form inside of the mouth or on the lips, gums, nose, chin, or cheeks. Cold sores can spread to other parts of the body, such as the eyes or fingers. In some people with other medical conditions, cold sores can spread to multiple other body sites, including the genitals. Cold sores can be spread or passed from person to person (contagious) until the sores crust over completely. What are the causes? Cold sores are caused by the herpes simplex virus (HSV-1). HSV-1 is closely related to the virus that causes genital herpes (HSV-2), but these viruses are not the same. Once a person is infected with HSV-1, the virus remains permanently in the body. HSV-1 is spread from person to person through close contact, such as through kissing, touching the affected area, or sharing personal items such as lip balm, razors, or eating utensils. What increases the risk? A cold sore outbreak is more likely to develop in people who:  Are tired, stressed, or sick.  Are menstruating.  Are pregnant.  Take certain medicines.  Are exposed to cold weather or too much sun.  What are the signs or symptoms? Symptoms of a cold sore outbreak often go through different stages. Here is how a cold sore develops:  Tingling, itching, or burning is felt 1-2 days before the outbreak.  Fluid-filled blisters appear on the lips, inside the mouth, on the nose, or on the cheeks.  The blisters start to ooze clear fluid.  The blisters dry up and a yellow crust appears in its place.  The crust falls off.  Other symptoms include:  Fever.  Sore throat.  Headache.  Muscle aches.  Swollen neck glands.  You also may not have any symptoms. How is this diagnosed? This condition is often diagnosed based on your medical history and a physical exam. Your health care provider may swab your sore and then examine it in  the lab. Rarely, blood tests may be done to check for HSV-1. How is this treated? There is no cure for cold sores or HSV-1. There also is no vaccine for HSV-1. Most cold sores go away on their own without treatment within two weeks. Medicines cannot make the infection go away, but medicines can:  Help relieve some of the pain associated with the sores.  Work to stop the virus from multiplying.  Shorten healing time.  Medicines may be in the form of creams, gels, pills, or a shot. Follow these instructions at home: Medicines  Take or apply over-the-counter and prescription medicines only as told by your health care provider.  Use a cotton-tip swab to apply creams or gels to your sores. Sore Care  Do not touch the sores or pick the scabs.  Wash your hands often. Do not touch your eyes without washing your hands first.  Keep the sores clean and dry.  If directed, apply ice to the sores: ? Put ice in a plastic bag. ? Place a towel between your skin and the bag. ? Leave the ice on for 20 minutes, 2-3 times per day. Lifestyle  Do not kiss, have oral sex, or share personal items until your sores heal.  Eat a soft, bland diet. Avoid eating hot, cold, or salty foods. These can hurt your mouth.  Use a straw if it hurts to drink out of a glass.  Avoid the sun and limit your stress if these things   trigger outbreaks. If sun causes cold sores, apply sunscreen on your lips before being out in the sun. Contact a health care provider if:  You have symptoms for more than two weeks.  You have pus coming from the sores.  You have redness that is spreading.  You have pain or irritation in your eye.  You get sores on your genitals.  Your sores do not heal within two weeks.  You have frequent cold sore outbreaks. Get help right away if:  You have a fever and your symptoms suddenly get worse.  You have a headache and confusion. This information is not intended to replace advice  given to you by your health care provider. Make sure you discuss any questions you have with your health care provider. Document Released: 03/03/2000 Document Revised: 10/29/2015 Document Reviewed: 12/25/2014 Elsevier Interactive Patient Education  2018 [employer].   Urinary Frequency, Adult Urinary frequency means urinating more often than usual. People with urinary frequency urinate at least 8 times in 24 hours, even if they drink a normal amount of fluid. Although they urinate more often than normal, the total amount of urine produced in a day may be normal. Urinary frequency is also called pollakiuria. What are the causes? This condition may be caused by:  A urinary tract infection.  Obesity.  Bladder problems, such as bladder stones.  Caffeine or alcohol.  Eating food or drinking fluids that irritate the bladder. These include coffee, tea, soda, artificial sweeteners, citrus, tomato-based foods, and chocolate.  Certain medicines, such as medicines that help the body get rid of extra fluid (diuretics).  Muscle or nerve weakness.  Overactive bladder.  Chronic diabetes.  Interstitial cystitis.  In men, problems with the prostate, such as an enlarged prostate.  In women, pregnancy.  In some cases, the cause may not be known. What increases the risk? This condition is more likely to develop in:  Women who have gone through menopause.  Men with prostate problems.  People with a disease or injury that affects the nerves or spinal cord.  People who have or have had a condition that affects the brain, such as a stroke.  What are the signs or symptoms? Symptoms of this condition include:  Feeling an urgent need to urinate often. The stress and anxiety of needing to find a bathroom quickly can make this urge worse.  Urinating 8 or more times in 24 hours.  Urinating as often as every 1 to 2 hours.  How is this diagnosed? This condition is diagnosed based on your  symptoms, your medical history, and a physical exam. You may have tests, such as:  Blood tests.  Urine tests.  Imaging tests, such as X-rays or ultrasounds.  A bladder test.  A test of your neurological system. This is the body system that senses the need to urinate.  A test to check for problems in the urethra and bladder called cystoscopy.  You may also be asked to keep a bladder diary. A bladder diary is a record of what you eat and drink, how often you urinate, and how much you urinate. You may need to see a health care provider who specializes in conditions of the urinary tract (urologist) or kidneys (nephrologist). How is this treated? Treatment for this condition depends on the cause. Sometimes the condition goes away on its own and treatment is not necessary. If treatment is needed, it may include:  Taking medicine.  Learning exercises that strengthen the muscles that help control  urination.  Following a bladder training program. This may include: ? Learning to delay going to the bathroom. ? Double urinating (voiding). This helps if you are not completely emptying your bladder. ? Scheduled voiding.  Making diet changes, such as: ? Avoiding caffeine. ? Drinking fewer fluids, especially alcohol. ? Not drinking in the evening. ? Not having foods or drinks that may irritate the bladder. ? Eating foods that help prevent or ease constipation. Constipation can make this condition worse.  Having the nerves in your bladder stimulated. There are two options for stimulating the nerves to your bladder: ? Outpatient electrical nerve stimulation. This is done by your health care provider. ? Surgery to implant a bladder pacemaker. The pacemaker helps to control the urge to urinate.  Follow these instructions at home:  Keep a bladder diary if told to by your health care provider.  Take over-the-counter and prescription medicines only as told by your health care provider.  Do any  exercises as told by your health care provider.  Follow a bladder training program as told by your health care provider.  Make any recommended diet changes.  Keep all follow-up visits as told by your health care provider. This is important. Contact a health care provider if:  You start urinating more often.  You feel pain or irritation when you urinate.  You notice blood in your urine.  Your urine looks cloudy.  You develop a fever.  You begin vomiting. Get help right away if:  You are unable to urinate. This information is not intended to replace advice given to you by your health care provider. Make sure you discuss any questions you have with your health care provider. Document Released: 12/31/2008 Document Revised: 04/07/2015 Document Reviewed: 09/30/2014 Elsevier Interactive Patient Education  [employer].

## 2016-10-10 NOTE — Progress Notes (Signed)
GYNECOLOGY  VISIT   HPI: 41 y.o.   Single  African American  female   [id number] with Patient's last menstrual period was 10/04/2016.   here for pelvic pain.  Reporting tingling feeling "down below.' Some pelvic pain.  Feels like menstrual cramping.    Has had intermittent lower abdominal pain off and on since the IUD was placed.  Had negative GC/CT.  Normal position of IUD with [place].  2 small fibroids. Did a partial course of doxycycline and not sure if the pain helped.   Recently she took a bit of left over doxycycline and one Diflucan and no real improvement.   Urine is smelling metallic.  No dysuria. Last UTI was a few years ago.   No vaginal discharge.  Denies bowel complaints. Some anal discomfort.  Feels like has intermittent blisters at the top of her buttocks.  Report hx of HSV I as fever blisters, but not for a long time.  No fever.   Really wants full STD testing.  No change in partner.   Graduating from college in December.  2 children and one grandchild.   Urine - mod RBCs and small WBCs.  Just had vaginal spotting.  UPT negative today.   GYNECOLOGIC HISTORY: Patient's last menstrual period was 10/04/2016. Contraception:  IUD. mirena placed 09/2015  Menopausal hormone therapy:  none Last mammogram:  None  Last pap smear:   12/25/14 Neg. HR HPV:+detected. #16, 18/45 Neg         OB History    Gravida Para Term Preterm AB Living   5 2 2  0 3 2   SAB TAB Ectopic Multiple Live Births   0 3 0 0 2         Patient Active Problem List   Diagnosis Date Noted  . Routine general medical examination at a health care facility 02/07/2016  . Essential hypertension 02/07/2016  . Respiratory tract infection 02/07/2016  . IUD (intrauterine device) in place 02/07/2016  . Obesity 12/25/2014  . Screen for STD (sexually transmitted disease) 12/25/2014  . Screening for cervical cancer 12/25/2014  . Need for prophylactic vaccination and inoculation against influenza  12/25/2014  . Encounter for health maintenance examination in adult 12/25/2014  . Sleep disturbance 12/25/2014  . Pelvic pain in female 12/25/2014  . Encounter for surveillance of contraceptive pills 02/05/2014  . Impaired fasting blood sugar 02/05/2014    Past Medical History:  Diagnosis Date  . Abnormal Pap smear 2005   paps normal 2007, 2008, 2009, 2010, 2013, 2014  . Back pain   . Chlamydia 2016  . Herpes gingivostomatitis   . History of EKG 02/09/09   normal  . History of pregnancy    5 pregnancies, 2 live births, 3 TABs  . Hypertension   . Impaired fasting blood sugar 2015  . Liver hemangioma 2008   per ultrasound and 02/2013 MRI abdomen  . Obesity     Past Surgical History:  Procedure Laterality Date  . COLPOSCOPY  05/2003  . INTRAUTERINE DEVICE (IUD) INSERTION  2017   Mirena  . KNEE SURGERY  2000   R knee, ACL, MCL meniscus    Current Outpatient Prescriptions  Medication Sig Dispense Refill  . amLODipine (NORVASC) 10 MG tablet Take 1 tablet (10 mg total) by mouth daily. 90 tablet 1  . levonorgestrel (MIRENA) 20 MCG/24HR IUD 1 each by Intrauterine route once.    . sulfamethoxazole-trimethoprim (BACTRIM DS) 800-160 MG tablet Take 1 tablet by mouth 2 (two) times  daily. One PO BID x 3 days 6 tablet 0   No current facility-administered medications for this visit.      ALLERGIES: Phentermine  Family History  Problem Relation Age of Onset  . Cancer Maternal Uncle        leukemia  . Tuberculosis Father   . Diabetes Father   . Hypertension Father   . Anesthesia problems Neg Hx   . Heart disease Neg Hx   . Hyperlipidemia Neg Hx   . Stroke Neg Hx     Social History   Social History  . Marital status: Single    Spouse name: N/A  . Number of children: N/A  . Years of education: N/A   Occupational History  . admin assistant     [employer]   Social History Main Topics  . Smoking status: Never Smoker  . Smokeless tobacco: Never Used  . Alcohol  use No  . Drug use: No  . Sexual activity: Yes    Partners: Male    Birth control/ protection: Pill   Other Topics Concern  . Not on file   Social History Narrative   Single, relationship with son's father off and on x 3 years, 2 children, ages 65yo son and 52yo daughter.  Has 1st granddaughter.  Exercise - not much.  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    ROS:  Pertinent items are noted in HPI.  PHYSICAL EXAMINATION:    BP (!) 146/92 (BP Location: Right Arm, Patient Position: Sitting, Cuff Size: Large)   Pulse 94   Temp 98.8 F (37.1 C) (Oral)   Resp 18   Ht 5\' 5"  (1.651 m)   Wt 268 lb (121.6 kg)   LMP 10/04/2016   BMI 44.60 kg/m     General appearance: alert, cooperative and appears stated age Abdomen:  Soft, nontender, nondistended, no organomegaly. Pelvic: External genitalia:  no lesions              Urethra:  normal appearing urethra with no masses, tenderness or lesions              Bartholins and Skenes: normal                 Vagina: normal appearing vagina with normal color and discharge, no lesions              Cervix: no lesions.  IUD strings not seen but felt on BM exam.  States some tenderness with palpation of her cervix.                Bimanual Exam:  Uterus:  normal size, contour, position, consistency, mobility, non-tender              Adnexa: no mass, fullness, tenderness              Rectal exam: Yes.  .  Confirms.              Anus:  normal sphincter tone, no lesions  Chaperone was present for exam.  ASSESSMENT  Pelvic pain - intermittent.  No evidence of PID on exam.  Mirena IUD patient.  Desire for STD testing.  Abnormal urine today.  Suspect UTI.  Patient wants tx today.  Hx HSV I.  PLAN  Will do full STD screening - HIV, syphilis, hep B/C, gonorrhea and chlamydia, Affirm panel, and serum HSV I and II IgG. Urine micro and cx. Will tx with Bactrim DS  po bid x 3 days.  Return for any episode of  lesions on her bottom.  We discussed that this may represent an HSV infection and that seeing her when it occurs may help [place] to make this diagnosis.     An After Visit Summary was printed and given to the patient.  __25___ minutes face to face time of which over 50% was spent in counseling.

## 2016-10-10 NOTE — Telephone Encounter (Signed)
Reviewed schedule with Clinical nurse supervisor, recommended OV with Dr. Quincy Simmonds today at 11:45 am.   Returned call to patient, advised patient Dr. Talbert Nan is in surgery. Patient scheduled for today at 11:45am with Dr. Quincy Simmonds.   Routing to provider for final review. Patient is agreeable to disposition. Will close encounter.   Cc: Dr. Talbert Nan, Lamont Snowball

## 2016-10-10 NOTE — Telephone Encounter (Signed)
Patient is having lower abdominal pain and would an  appointment today if possible.

## 2016-10-11 LAB — HEPATITIS C ANTIBODY: Hep C Virus Ab: <0.1 s/co ratio (ref 0.0–0.9)

## 2016-10-11 LAB — URINALYSIS, MICROSCOPIC ONLY: CASTS: NONE SEEN /LPF

## 2016-10-11 LAB — HEP, RPR, HIV PANEL
HIV Screen 4th Generation wRfx: NONREACTIVE
Hepatitis B Surface Ag: NEGATIVE
RPR Ser Ql: NONREACTIVE

## 2016-10-11 LAB — HSV(HERPES SIMPLEX VRS) I + II AB-IGG
HSV 1 Glycoprotein G Ab, IgG: 14 index — ABNORMAL HIGH (ref 0.00–0.90)
HSV 2 GLYCOPROTEIN G AB, IGG: 2.95 {index} — AB (ref 0.00–0.90)

## 2016-10-11 LAB — URINE CULTURE

## 2016-10-12 ENCOUNTER — Encounter: Payer: Self-pay | Admitting: Obstetrics and Gynecology

## 2016-10-12 ENCOUNTER — Telehealth: Payer: Self-pay

## 2016-10-12 LAB — GC/CHLAMYDIA PROBE AMP
CHLAMYDIA, DNA PROBE: NEGATIVE
NEISSERIA GONORRHOEAE BY PCR: NEGATIVE

## 2016-10-12 NOTE — Telephone Encounter (Signed)
Spoke with patient. Results given as seen below. All questions answered. Declines OV to discuss results at this time. Will return call if she would like to schedule an appointment.

## 2016-10-12 NOTE — Telephone Encounter (Signed)
-----   Message from Nunzio Cobbs, MD sent at 10/12/2016  9:56 AM EDT ----- Please contact patient regarding her results.  She does not have a bladder infection, so she can stop the Bactrim DS.  Testing is negative for HIV, hepatitis B and C, syphilis. She did test positive for HSV I and II.  We were expecting HSV I but questioned HSV II due to recurrent lesions on her buttocks. I would recommend an appointment to discuss this.  She may schedule with me or Dr. Talbert Nan, her primary.  Her Affirm, GC/CT are still pending.   Cc- Dr. Talbert Nan

## 2016-10-13 ENCOUNTER — Telehealth: Payer: Self-pay | Admitting: Obstetrics and Gynecology

## 2016-10-13 LAB — VAGINITIS/VAGINOSIS, DNA PROBE
Candida Species: NEGATIVE
Gardnerella vaginalis: NEGATIVE
TRICHOMONAS VAG: NEGATIVE

## 2016-10-13 MED ORDER — VALACYCLOVIR HCL 500 MG PO TABS: ORAL_TABLET | ORAL | 0 refills | Status: DC

## 2016-10-13 NOTE — Telephone Encounter (Signed)
Spoke with patient regarding HSV results. See results below. Patient states she continues to have pain and tingling in her vaginal. Denies any active lesions. Feels this is because she is about to have an outbreak. Requesting rx for Valtrex. Advised will review with Dr.Silva and return call.  Notes recorded by Nunzio Cobbs, MD on 10/12/2016 at 9:56 AM EDT Please contact patient regarding her results.  She does not have a bladder infection, so she can stop the Bactrim DS.  Testing is negative for HIV, hepatitis B and C, syphilis. She did test positive for HSV I and II.  We were expecting HSV I but questioned HSV II due to recurrent lesions on her buttocks. I would recommend an appointment to discuss this.  She may schedule with me or Dr. Talbert Nan, her primary.  Her Affirm, GC/CT are still pending.   Cc- Dr. Talbert Nan

## 2016-10-13 NOTE — Telephone Encounter (Signed)
Patient has questions about her lab results

## 2016-10-13 NOTE — Telephone Encounter (Signed)
This is Dr. Quincy Simmonds assisting in Dr. Gentry Fitz absence today.  OK for Valtrex 500 mg po bid x 3 days prn outbreak. Dispense:  30, RF one.   Cc- Dr. Talbert Nan

## 2016-10-13 NOTE — Telephone Encounter (Signed)
Spoke with patient. Advised of message as seen below from [place]. Patient verbalizes understanding. Rx for Valtrex 500 mg po bid x 3 days prn outbreak #30 0RF sent to pharmacy on file.  CC: Dr.Jertson  Routing to [employer]  provider for final review. Patient agreeable to disposition. Will close encounter.

## 2016-11-25 ENCOUNTER — Encounter (HOSPITAL_COMMUNITY): Payer: Self-pay | Admitting: Emergency Medicine

## 2016-11-25 ENCOUNTER — Emergency Department (HOSPITAL_COMMUNITY)
Admission: EM | Admit: 2016-11-25 | Discharge: 2016-11-25 | Disposition: A | Discharge status: Home / Self Care | Payer: Managed Care, Other (non HMO) | Attending: Emergency Medicine | Admitting: Emergency Medicine

## 2016-11-25 DIAGNOSIS — R55 Syncope and collapse: Secondary | ICD-10-CM

## 2016-11-25 DIAGNOSIS — I10 Essential (primary) hypertension: Secondary | ICD-10-CM | POA: Insufficient documentation

## 2016-11-25 DIAGNOSIS — R42 Dizziness and giddiness: Secondary | ICD-10-CM | POA: Diagnosis not present

## 2016-11-25 LAB — URINALYSIS, ROUTINE W REFLEX MICROSCOPIC
BILIRUBIN URINE: NEGATIVE
Glucose, UA: NEGATIVE mg/dL
KETONES UR: NEGATIVE mg/dL
Leukocytes, UA: NEGATIVE
NITRITE: NEGATIVE
PH: 6 (ref 5.0–8.0)
Protein, ur: NEGATIVE mg/dL
SPECIFIC GRAVITY, URINE: 1.004 — AB (ref 1.005–1.030)

## 2016-11-25 LAB — BASIC METABOLIC PANEL
ANION GAP: 10 (ref 5–15)
BUN: 13 mg/dL (ref 6–20)
CHLORIDE: 101 mmol/L (ref 101–111)
CO2: 23 mmol/L (ref 22–32)
Calcium: 9 mg/dL (ref 8.9–10.3)
Creatinine, Ser: 1.12 mg/dL — ABNORMAL HIGH (ref 0.44–1.00)
GFR calc Af Amer: >60 mL/min (ref >60)
GFR calc non Af Amer: >60 mL/min (ref >60)
GLUCOSE: 103 mg/dL — AB (ref 65–99)
POTASSIUM: 3.7 mmol/L (ref 3.5–5.1)
Sodium: 134 mmol/L — ABNORMAL LOW (ref 135–145)

## 2016-11-25 LAB — I-STAT BETA HCG BLOOD, ED (MC, WL, AP ONLY): I-stat hCG, quantitative: <5 m[IU]/mL (ref <5)

## 2016-11-25 LAB — CBG MONITORING, ED: GLUCOSE-CAPILLARY: 92 mg/dL (ref 65–99)

## 2016-11-25 LAB — CBC
HEMATOCRIT: 40.7 % (ref 36.0–46.0)
Hemoglobin: 13.3 g/dL (ref 12.0–15.0)
MCH: 29.6 pg (ref 26.0–34.0)
MCHC: 32.7 g/dL (ref 30.0–36.0)
MCV: 90.6 fL (ref 78.0–100.0)
Platelets: 310 10*3/uL (ref 150–400)
RBC: 4.49 MIL/uL (ref 3.87–5.11)
RDW: 13.1 % (ref 11.5–15.5)
WBC: 12.8 10*3/uL — ABNORMAL HIGH (ref 4.0–10.5)

## 2016-11-25 MED ORDER — MECLIZINE HCL 12.5 MG PO TABS: 12.5000 mg | ORAL_TABLET | Freq: Three times a day (TID) | ORAL | 0 refills | Status: DC | PRN

## 2016-11-25 NOTE — ED Notes (Signed)
Per EMS report:  Pt has hx of HTN, decided not to take meds since Feb of this year. Today had dizziness w/standing and called EMS who found BP 190/120.  NSR 100.

## 2016-11-25 NOTE — ED Triage Notes (Signed)
Pt reports she had a near syncopal episode while talking on the phone an hour ago. Called EMS for transport. Pt reports she feels better now.

## 2016-11-25 NOTE — Discharge Instructions (Signed)
We believe your symptoms were caused by benign vertigo.  Please read through the included information and take any prescribed medication(s).  Follow up with your doctor as listed above.  If you develop any new or worsening symptoms that concern you, including but not limited to persistent dizziness/vertigo, numbness or weakness in your arms or legs, altered mental status, persistent vomiting, or fever greater than 101, please return immediately to the Emergency Department.  

## 2016-11-25 NOTE — ED Provider Notes (Signed)
Emergency Department Provider Note   I have reviewed the triage vital signs and the nursing notes.   HISTORY  Chief Complaint Near Syncope   HPI Joanne Johns is a 41 y.o. female with PMH of HTN presents to the emergency department for evaluation of sudden onset lightheadedness and sensation of room spinning. Patient states that she had difficulty standing and felt nauseated. No vomiting. No similar symptoms in the past. The patient denies any chest pain, palpitations, shortness of breath. The pain in the ears. Patient states she took her blood pressure medications and called EMS. She continued to have symptoms in route but states that eventually resolved. Her lightheadedness was worse with looking up or standing. No radiation of symptoms. No modifying factors.   Past Medical History:  Diagnosis Date  . Abnormal Pap smear 2005   paps normal 2007, 2008, 2009, 2010, 2013, 2014  . Back pain   . Chlamydia 2016  . Herpes gingivostomatitis   . History of EKG 02/09/09   normal  . History of pregnancy    5 pregnancies, 2 live births, 3 TABs  . HSV infection 2018   positive HSV I and II  . Hypertension   . Impaired fasting blood sugar 2015  . Liver hemangioma 2008   per ultrasound and 02/2013 MRI abdomen  . Obesity     Patient Active Problem List   Diagnosis Date Noted  . Routine general medical examination at a health care facility 02/07/2016  . Essential hypertension 02/07/2016  . Respiratory tract infection 02/07/2016  . IUD (intrauterine device) in place 02/07/2016  . Obesity 12/25/2014  . Screen for STD (sexually transmitted disease) 12/25/2014  . Screening for cervical cancer 12/25/2014  . Need for prophylactic vaccination and inoculation against influenza 12/25/2014  . Encounter for health maintenance examination in adult 12/25/2014  . Sleep disturbance 12/25/2014  . Pelvic pain in female 12/25/2014  . Encounter for surveillance of contraceptive pills 02/05/2014  .  Impaired fasting blood sugar 02/05/2014    Past Surgical History:  Procedure Laterality Date  . COLPOSCOPY  05/2003  . INTRAUTERINE DEVICE (IUD) INSERTION  2017   Mirena  . KNEE SURGERY  2000   R knee, ACL, MCL meniscus    Current Outpatient Rx  . Order #: [id number] Class: Normal  . Order #: [id number] Class: Historical Med  . Order #: [id number] Class: Historical Med  . Order #: [id number] Class: Print  . Order #: [id number] Class: Normal  . Order #: [id number] Class: Normal    Allergies Phentermine  Family History  Problem Relation Age of Onset  . Cancer Maternal Uncle        leukemia  . Tuberculosis Father   . Diabetes Father   . Hypertension Father   . Anesthesia problems Neg Hx   . Heart disease Neg Hx   . Hyperlipidemia Neg Hx   . Stroke Neg Hx     Social History Social History  Substance Use Topics  . Smoking status: Never Smoker  . Smokeless tobacco: Never Used  . Alcohol use No    Review of Systems  Constitutional: No fever/chills. Positive lightheadedness.  Eyes: No visual changes. ENT: No sore throat. Positive vertigo.  Cardiovascular: Denies chest pain. Respiratory: Denies shortness of breath. Gastrointestinal: No abdominal pain.  No nausea, no vomiting.  No diarrhea.  No constipation. Genitourinary: Negative for dysuria. Musculoskeletal: Negative for back pain. Skin: Negative for rash. Neurological: Negative for headaches, focal weakness or numbness.  10-point ROS otherwise negative.  ____________________________________________  PHYSICAL EXAM:  VITAL SIGNS: ED Triage Vitals [11/25/16 1749]  Enc Vitals Group     BP (!) 176/99     Pulse Rate 98     Resp 20     Temp 98.4 F (36.9 C)     Temp Source Oral     SpO2 100 %   Constitutional: Alert and oriented. Well appearing and in no acute distress. Eyes: Conjunctivae are normal. PERRL. EOMI. Head: Atraumatic. Ears:  Healthy appearing ear canals and TMs bilaterally. Trace fluid behind right  TM.  Nose: No congestion/rhinnorhea. Mouth/Throat: Mucous membranes are moist.   Neck: No stridor.  Cardiovascular: Normal rate, regular rhythm. Good peripheral circulation. Grossly normal heart sounds.   Respiratory: Normal respiratory effort.  No retractions. Lungs CTAB. Gastrointestinal: Soft and nontender. No distention.  Musculoskeletal: No lower extremity tenderness nor edema. No gross deformities of extremities. Neurologic:  Normal speech and language. No gross focal neurologic deficits are appreciated. Normal gait. Normal finger-to-nose. No CN deficits 2-12.  Skin:  Skin is warm, dry and intact. No rash noted. Psychiatric: Mood and affect are normal. Speech and behavior are normal.  ____________________________________________   LABS (all labs ordered are listed, but only abnormal results are displayed)  Labs Reviewed  BASIC METABOLIC PANEL - Abnormal; Notable for the following:       Result Value   Sodium 134 (*)    Glucose, Bld 103 (*)    Creatinine, Ser 1.12 (*)    All other components within normal limits  CBC - Abnormal; Notable for the following:    WBC 12.8 (*)    All other components within normal limits  URINALYSIS, ROUTINE W REFLEX MICROSCOPIC - Abnormal; Notable for the following:    Color, Urine STRAW (*)    Specific Gravity, Urine 1.004 (*)    Hgb urine dipstick MODERATE (*)    Bacteria, UA RARE (*)    Squamous Epithelial / LPF 0-5 (*)    All other components within normal limits  CBG MONITORING, ED  I-STAT BETA HCG BLOOD, ED ([facility], [facility], [facility] ONLY)   ____________________________________________  EKG   EKG Interpretation  Date/Time:  Saturday November 25 2016 18:22:11 EDT Ventricular Rate:  92 PR Interval:    QRS Duration: 74 QT Interval:  331 QTC Calculation: 410 R Axis:   60 Text Interpretation:  Sinus rhythm Nonspecific T abnormalities, inferior leads No STEMI. No old tracing for comparison.  Confirmed by Nanda Quinton [phone]) on 11/25/2016 9:48:09 PM  Also confirmed by Nanda Quinton [phone]), editor Drema Pry [phone])  on 11/26/2016 7:28:37 AM       ____________________________________________  RADIOLOGY  None ____________________________________________   PROCEDURES  Procedure(s) performed:   Procedures  None ____________________________________________   INITIAL IMPRESSION / ASSESSMENT AND PLAN / ED COURSE  Pertinent labs & imaging results that were available during my care of the patient were reviewed by me and considered in my medical decision making (see chart for details).  Patient presents to the emergency room for evaluation of near syncope and vertigo symptoms. She has history of hypertension but is noncompliant with medications until tonight when she did take her Norvasc and response to vertigo. Symptoms resolved spontaneously after approximately 1 hour symptoms. Patient with no focal neurological deficits. Labs from triage and EKG are largely unremarkable. I advised her to begin taking her blood pressure medication daily and follow with her primary care physician. No indication for evaluation or testing for central vertigo at this time.  At this time, I  do not feel there is any life-threatening condition present. I have reviewed and discussed all results (EKG, imaging, lab, urine as appropriate), exam findings with patient. I have reviewed nursing notes and appropriate previous records.  I feel the patient is safe to be discharged home without further emergent workup. Discussed usual and customary return precautions. Patient and family (if present) verbalize understanding and are comfortable with this plan.  Patient will follow-up with their primary care provider. If they do not have a primary care provider, information for follow-up has been provided to them. All questions have been answered.  ____________________________________________  FINAL CLINICAL IMPRESSION(S) / ED DIAGNOSES  Final diagnoses:  Near  syncope  Vertigo     MEDICATIONS GIVEN DURING THIS VISIT:  Medications - No data to display   NEW OUTPATIENT MEDICATIONS STARTED DURING THIS VISIT:  Discharge Medication List as of 11/25/2016 10:29 PM    START taking these medications   Details  meclizine (ANTIVERT) 12.5 MG tablet Take 1 tablet (12.5 mg total) by mouth 3 (three) times daily as needed for dizziness., Starting Sat 11/25/2016, Print        Note:  This document was prepared using Dragon voice recognition software and may include unintentional dictation errors.  Nanda Quinton, MD Emergency Medicine    Justino Boze, [facility], MD 11/26/16 [phone]

## 2017-01-12 ENCOUNTER — Encounter: Payer: Self-pay | Admitting: Obstetrics and Gynecology

## 2017-01-17 ENCOUNTER — Other Ambulatory Visit (HOSPITAL_COMMUNITY)
Admission: RE | Admit: 2017-01-17 | Discharge: 2017-01-17 | Disposition: A | Discharge status: Home / Self Care | Payer: Managed Care, Other (non HMO) | Source: Ambulatory Visit | Attending: Obstetrics and Gynecology | Admitting: Obstetrics and Gynecology

## 2017-01-17 ENCOUNTER — Encounter: Payer: Self-pay | Admitting: Obstetrics and Gynecology

## 2017-01-17 ENCOUNTER — Ambulatory Visit (INDEPENDENT_AMBULATORY_CARE_PROVIDER_SITE_OTHER): Payer: Managed Care, Other (non HMO) | Admitting: Obstetrics and Gynecology

## 2017-01-17 VITALS — BP 108/80 | HR 92 | Resp 18 | Ht 63.5 in | Wt 268.0 lb

## 2017-01-17 DIAGNOSIS — E663 Overweight: Secondary | ICD-10-CM | POA: Diagnosis not present

## 2017-01-17 DIAGNOSIS — N763 Subacute and chronic vulvitis: Secondary | ICD-10-CM

## 2017-01-17 DIAGNOSIS — Z113 Encounter for screening for infections with a predominantly sexual mode of transmission: Secondary | ICD-10-CM | POA: Insufficient documentation

## 2017-01-17 DIAGNOSIS — E010 Iodine-deficiency related diffuse (endemic) goiter: Secondary | ICD-10-CM | POA: Diagnosis not present

## 2017-01-17 DIAGNOSIS — Z124 Encounter for screening for malignant neoplasm of cervix: Secondary | ICD-10-CM | POA: Insufficient documentation

## 2017-01-17 DIAGNOSIS — Z01419 Encounter for gynecological examination (general) (routine) without abnormal findings: Secondary | ICD-10-CM | POA: Diagnosis not present

## 2017-01-17 DIAGNOSIS — Z0000 Encounter for general adult medical examination without abnormal findings: Secondary | ICD-10-CM

## 2017-01-17 DIAGNOSIS — Z30431 Encounter for routine checking of intrauterine contraceptive device: Secondary | ICD-10-CM | POA: Diagnosis not present

## 2017-01-17 MED ORDER — VALACYCLOVIR HCL 500 MG PO TABS: ORAL_TABLET | ORAL | 2 refills | Status: DC

## 2017-01-17 MED ORDER — BETAMETHASONE VALERATE 0.1 % EX OINT: TOPICAL_OINTMENT | CUTANEOUS | 0 refills | Status: DC

## 2017-01-17 NOTE — Patient Instructions (Signed)
EXERCISE AND DIET:  We recommended that you start or continue a regular exercise program for good health. Regular exercise means any activity that makes your heart beat faster and makes you sweat.  We recommend exercising at least 30 minutes per day at least 3 days a week, preferably 4 or 5.  We also recommend a diet low in fat and sugar.  Inactivity, poor dietary choices and obesity can cause diabetes, heart attack, stroke, and kidney damage, among others.    ALCOHOL AND SMOKING:  Women should limit their alcohol intake to no more than 7 drinks/beers/glasses of wine (combined, not each!) per week. Moderation of alcohol intake to this level decreases your risk of breast cancer and liver damage. And of course, no recreational drugs are part of a healthy lifestyle.  And absolutely no smoking or even second hand smoke. Most people know smoking can cause heart and lung diseases, but did you know it also contributes to weakening of your bones? Aging of your skin?  Yellowing of your teeth and nails?  CALCIUM AND VITAMIN D:  Adequate intake of calcium and Vitamin D are recommended.  The recommendations for exact amounts of these supplements seem to change often, but generally speaking 600 mg of calcium (either carbonate or citrate) and 800 units of Vitamin D per day seems prudent. Certain women may benefit from higher intake of Vitamin D.  If you are among these women, your doctor will have told you during your visit.    PAP SMEARS:  Pap smears, to check for cervical cancer or precancers,  have traditionally been done yearly, although recent scientific advances have shown that most women can have pap smears less often.  However, every woman still should have a physical exam from her gynecologist every year. It will include a breast check, inspection of the vulva and vagina to check for abnormal growths or skin changes, a visual exam of the cervix, and then an exam to evaluate the size and shape of the uterus and  ovaries.  And after 40 years of age, a rectal exam is indicated to check for rectal cancers. We will also provide age appropriate advice regarding health maintenance, like when you should have certain vaccines, screening for sexually transmitted diseases, bone density testing, colonoscopy, mammograms, etc.   MAMMOGRAMS:  All women over 40 years old should have a yearly mammogram. Many facilities now offer a "3D" mammogram, which may cost around $50 extra out of pocket. If possible,  we recommend you accept the option to have the 3D mammogram performed.  It both reduces the number of women who will be called back for extra views which then turn out to be normal, and it is better than the routine mammogram at detecting truly abnormal areas.    COLONOSCOPY:  Colonoscopy to screen for colon cancer is recommended for all women at age 50.  We know, you hate the idea of the prep.  We agree, BUT, having colon cancer and not knowing it is worse!!  Colon cancer so often starts as a polyp that can be seen and removed at colonscopy, which can quite literally save your life!  And if your first colonoscopy is normal and you have no family history of colon cancer, most women don't have to have it again for 10 years.  Once every ten years, you can do something that may end up saving your life, right?  We will be happy to help you get it scheduled when you are ready.    Be sure to check your insurance coverage so you understand how much it will cost.  It may be covered as a preventative service at no cost, but you should check your particular policy.      Breast Self-Awareness Breast self-awareness means being familiar with how your breasts look and feel. It involves checking your breasts regularly and reporting any changes to your health care provider. Practicing breast self-awareness is important. A change in your breasts can be a sign of a serious medical problem. Being familiar with how your breasts look and feel allows  you to find any problems early, when treatment is more likely to be successful. All women should practice breast self-awareness, including women who have had breast implants. How to do a breast self-exam One way to learn what is normal for your breasts and whether your breasts are changing is to do a breast self-exam. To do a breast self-exam: Look for Changes  1. Remove all the clothing above your waist. 2. Stand in front of a mirror in a room with good lighting. 3. Put your hands on your hips. 4. Push your hands firmly downward. 5. Compare your breasts in the mirror. Look for differences between them (asymmetry), such as: ? Differences in shape. ? Differences in size. ? Puckers, dips, and bumps in one breast and not the other. 6. Look at each breast for changes in your skin, such as: ? Redness. ? Scaly areas. 7. Look for changes in your nipples, such as: ? Discharge. ? Bleeding. ? Dimpling. ? Redness. ? A change in position. Feel for Changes  Carefully feel your breasts for lumps and changes. It is best to do this while lying on your back on the floor and again while sitting or standing in the shower or tub with soapy water on your skin. Feel each breast in the following way:  Place the arm on the side of the breast you are examining above your head.  Feel your breast with the other hand.  Start in the nipple area and make  inch (2 cm) overlapping circles to feel your breast. Use the pads of your three middle fingers to do this. Apply light pressure, then medium pressure, then firm pressure. The light pressure will allow you to feel the tissue closest to the skin. The medium pressure will allow you to feel the tissue that is a little deeper. The firm pressure will allow you to feel the tissue close to the ribs.  Continue the overlapping circles, moving downward over the breast until you feel your ribs below your breast.  Move one finger-width toward the center of the body.  Continue to use the  inch (2 cm) overlapping circles to feel your breast as you move slowly up toward your collarbone.  Continue the up and down exam using all three pressures until you reach your armpit.  Write Down What You Find  Write down what is normal for each breast and any changes that you find. Keep a written record with breast changes or normal findings for each breast. By writing this information down, you do not need to depend only on memory for size, tenderness, or location. Write down where you are in your menstrual cycle, if you are still menstruating. If you are having trouble noticing differences in your breasts, do not get discouraged. With time you will become more familiar with the variations in your breasts and more comfortable with the exam. How often should I examine my breasts? Examine   your breasts every month. If you are breastfeeding, the best time to examine your breasts is after a feeding or after using a breast pump. If you menstruate, the best time to examine your breasts is 5-7 days after your period is over. During your period, your breasts are lumpier, and it may be more difficult to notice changes. When should I see my health care provider? See your health care provider if you notice:  A change in shape or size of your breasts or nipples.  A change in the skin of your breast or nipples, such as a reddened or scaly area.  Unusual discharge from your nipples.  A lump or thick area that was not there before.  Pain in your breasts.  Anything that concerns you.  This information is not intended to replace advice given to you by your health care provider. Make sure you discuss any questions you have with your health care provider. Document Released: 03/06/2005 Document Revised: 08/12/2015 Document Reviewed: 01/24/2015 Elsevier Interactive Patient Education  2018 [employer]  

## 2017-01-17 NOTE — Progress Notes (Addendum)
40 y.o. [id number] SingleAfrican AmericanF here for annual exam. She has a mirena IUD, placed in 7/17.    She has had intermittent pelvic pain since the IUD was placed. IUD in place on ultrasound, negative genprobe. Her pain is better now.  No real cycles with the IUD, just intermittent spotting. Occasional cramps, helped with Naproxen. Much better.  Diagnosed with HSV 2 this summer on full STD testing. She has never had an HSV outbreak. She c/o tingling in her lips almost every day since the summer. No outbreaks. She c/o one prior episode of blisters on her buttock, no recurrence. Sometimes she feels some tingling on her vulva. She has a dry patch that gets very itchy on her vulva. Worse in the last couple of weeks.  Sexually active, same long term partner (sons Father). No pain with intercourse.     No LMP recorded. Patient is not currently having periods (Reason: IUD).          Sexually active: Yes.    The current method of family planning is IUD.    Exercising: Yes.    walking/ cardio/ weights  Smoker:  no  Health Maintenance: Pap:  12-25-14 WNL + HR HPV, - for HPV 16/18 History of abnormal Pap:  Yes, she has had a colposcopy in the distant past, no surgery on her cervix.  MMG:  Never Colonoscopy:  Never BMD:   Never TDaP:  05-04-11 Gardasil: No   reports that she has never smoked. She has never used smokeless tobacco. She reports that she does not drink alcohol or use drugs. 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 She is graduating from college this December. Kids are 25 and 5 (girl and boy). One grandchild (girl, 80 years old), local.   Past Medical History:  Diagnosis Date  . Abnormal Pap smear 2005   paps normal 2007, 2008, 2009, 2010, 2013, 2014  . Back pain   . Chlamydia 2016  . Herpes gingivostomatitis   . History of EKG 02/09/09   normal  . History of pregnancy    5 pregnancies, 2 live births, 3 TABs  . HSV infection 2018   positive HSV I and II  .  Hypertension   . Impaired fasting blood sugar 2015  . Liver hemangioma 2008   per ultrasound and 02/2013 MRI abdomen  . Obesity     Past Surgical History:  Procedure Laterality Date  . COLPOSCOPY  05/2003  . INTRAUTERINE DEVICE (IUD) INSERTION  2017   Mirena  . KNEE SURGERY  2000   R knee, ACL, MCL meniscus    Current Outpatient Prescriptions  Medication Sig Dispense Refill  . amLODipine (NORVASC) 10 MG tablet Take 1 tablet (10 mg total) by mouth daily. 90 tablet 1  . levonorgestrel (MIRENA) 20 MCG/24HR IUD 1 each by Intrauterine route once.    . valACYclovir (VALTREX) 500 MG tablet BID for 3 days prn outbreak 30 tablet 0   No current facility-administered medications for this visit.     Family History  Problem Relation Age of Onset  . Cancer Maternal Uncle        leukemia  . Tuberculosis Father   . Diabetes Father   . Hypertension Father   . Anesthesia problems Neg Hx   . Heart disease Neg Hx   . Hyperlipidemia Neg Hx   . Stroke Neg Hx     Review of Systems  Constitutional: Negative.   HENT: Negative.   Eyes: Negative.  Respiratory: Negative.   Cardiovascular: Negative.   Gastrointestinal: Negative.   Endocrine: Negative.   Genitourinary: Negative.   Musculoskeletal: Negative.   Skin: Negative.   Allergic/Immunologic: Negative.   Neurological: Negative.   Psychiatric/Behavioral: Negative.     Exam:   BP 108/80 (BP Location: Right Arm, Patient Position: Sitting, Cuff Size: Normal)   Pulse 92   Resp 18   Ht 5' 3.5" (1.613 m)   Wt 268 lb (121.6 kg)   BMI 46.73 kg/m   Weight change: @WEIGHTCHANGE @ Height:   Height: 5' 3.5" (161.3 cm)  Ht Readings from Last 3 Encounters:  01/17/17 5' 3.5" (1.613 m)  10/10/16 5\' 5"  (1.651 m)  02/07/16 5\' 5"  (1.651 m)    General appearance: alert, cooperative and appears stated age Head: Normocephalic, without obvious abnormality, atraumatic Neck: no adenopathy, supple, symmetrical, trachea midline and thyroid normal to  inspection and palpation Lungs: clear to auscultation bilaterally Cardiovascular: regular rate and rhythm Breasts: normal appearance, no masses or tenderness Abdomen: soft, non-tender; non distended,  no masses,  no organomegaly Extremities: extremities normal, atraumatic, no cyanosis or edema Skin: Skin color, texture, turgor normal. No rashes or lesions Lymph nodes: Cervical, supraclavicular, and axillary nodes normal. No abnormal inguinal nodes palpated Neurologic: Grossly normal   Pelvic: External genitalia:  She has a large patch on her mons that looks white and slightly raised.               Urethra:  normal appearing urethra with no masses, tenderness or lesions              Bartholins and Skenes: normal                 Vagina: normal appearing vagina with normal color and discharge, no lesions              Cervix: no lesions and IUD string not seen               Bimanual Exam:  Uterus:  normal size, contour, position, consistency, mobility, non-tender and anteverted              Adnexa: no mass, fullness, tenderness               Rectovaginal: Confirms               Anus:  normal sphincter tone, no lesions  Chaperone was present for exam.  A:  Well Woman with normal exam  IUD check, IUD in place on ultrasound  Pap is over due, last pap with +HPV  Vulvitis  Thyromegaly  P:   Mammogram  Pap with hpv  Labs with primary  Discussed breast self exam  Discussed calcium and vit D intake  Full STD testing  Vaginitis panel on pap  Steroid ointment x 1-2 weeks to the mons, then f/u. If not better will do a biopsy.      In addition to the annual exam, another 15 minutes was spent face to face with the patient, over 50% in counseling  Addendum: error above, under exam the patient was noted to have thyromegaly.

## 2017-01-18 LAB — COMPREHENSIVE METABOLIC PANEL
A/G RATIO: 2 (ref 1.2–2.2)
ALT: 16 IU/L (ref 0–32)
AST: 14 IU/L (ref 0–40)
Albumin: 4.3 g/dL (ref 3.5–5.5)
Alkaline Phosphatase: 67 IU/L (ref 39–117)
BILIRUBIN TOTAL: 0.3 mg/dL (ref 0.0–1.2)
BUN/Creatinine Ratio: 10 (ref 9–23)
BUN: 10 mg/dL (ref 6–24)
CALCIUM: 9.4 mg/dL (ref 8.7–10.2)
CHLORIDE: 101 mmol/L (ref 96–106)
CO2: 22 mmol/L (ref 20–29)
Creatinine, Ser: 1 mg/dL (ref 0.57–1.00)
GFR calc Af Amer: 81 mL/min/{1.73_m2} (ref >59)
GFR calc non Af Amer: 71 mL/min/{1.73_m2} (ref >59)
Globulin, Total: 2.1 g/dL (ref 1.5–4.5)
Glucose: 84 mg/dL (ref 65–99)
POTASSIUM: 4.6 mmol/L (ref 3.5–5.2)
Sodium: 138 mmol/L (ref 134–144)
Total Protein: 6.4 g/dL (ref 6.0–8.5)

## 2017-01-18 LAB — THYROID PANEL WITH TSH
Free Thyroxine Index: 1.8 (ref 1.2–4.9)
T3 Uptake Ratio: 27 % (ref 24–39)
T4, Total: 6.8 ug/dL (ref 4.5–12.0)
TSH: 2.06 u[IU]/mL (ref 0.450–4.500)

## 2017-01-18 LAB — CBC
Hematocrit: 40.7 % (ref 34.0–46.6)
Hemoglobin: 13.3 g/dL (ref 11.1–15.9)
MCH: 29.4 pg (ref 26.6–33.0)
MCHC: 32.7 g/dL (ref 31.5–35.7)
MCV: 90 fL (ref 79–97)
PLATELETS: 299 10*3/uL (ref 150–379)
RBC: 4.52 x10E6/uL (ref 3.77–5.28)
RDW: 14.4 % (ref 12.3–15.4)
WBC: 7.4 10*3/uL (ref 3.4–10.8)

## 2017-01-18 LAB — LIPID PANEL
Chol/HDL Ratio: 2.2 ratio (ref 0.0–4.4)
Cholesterol, Total: 194 mg/dL (ref 100–199)
HDL: 89 mg/dL (ref >39)
LDL CALC: 94 mg/dL (ref 0–99)
Triglycerides: 55 mg/dL (ref 0–149)
VLDL CHOLESTEROL CAL: 11 mg/dL (ref 5–40)

## 2017-01-18 LAB — HEP, RPR, HIV PANEL
HEP B S AG: NEGATIVE
HIV Screen 4th Generation wRfx: NONREACTIVE
RPR: NONREACTIVE

## 2017-01-18 LAB — HEMOGLOBIN A1C
ESTIMATED AVERAGE GLUCOSE: 108 mg/dL
Hgb A1c MFr Bld: 5.4 % (ref 4.8–5.6)

## 2017-01-18 LAB — HEPATITIS C ANTIBODY: Hep C Virus Ab: <0.1 s/co ratio (ref 0.0–0.9)

## 2017-01-19 LAB — CYTOLOGY - PAP
BACTERIAL VAGINITIS: NEGATIVE
CANDIDA VAGINITIS: NEGATIVE
Chlamydia: NEGATIVE
Diagnosis: NEGATIVE
HPV: NOT DETECTED
NEISSERIA GONORRHEA: NEGATIVE
TRICH (WINDOWPATH): NEGATIVE

## 2017-01-25 ENCOUNTER — Other Ambulatory Visit: Payer: Self-pay | Admitting: *Deleted

## 2017-01-25 DIAGNOSIS — E010 Iodine-deficiency related diffuse (endemic) goiter: Secondary | ICD-10-CM

## 2017-01-31 ENCOUNTER — Encounter: Payer: Self-pay | Admitting: Obstetrics and Gynecology

## 2017-01-31 ENCOUNTER — Other Ambulatory Visit: Payer: Self-pay

## 2017-01-31 ENCOUNTER — Ambulatory Visit (INDEPENDENT_AMBULATORY_CARE_PROVIDER_SITE_OTHER): Payer: Managed Care, Other (non HMO) | Admitting: Obstetrics and Gynecology

## 2017-01-31 VITALS — BP 158/80 | HR 76 | Resp 16 | Wt 267.0 lb

## 2017-01-31 DIAGNOSIS — R8290 Unspecified abnormal findings in urine: Secondary | ICD-10-CM | POA: Diagnosis not present

## 2017-01-31 DIAGNOSIS — N763 Subacute and chronic vulvitis: Secondary | ICD-10-CM

## 2017-01-31 DIAGNOSIS — N898 Other specified noninflammatory disorders of vagina: Secondary | ICD-10-CM

## 2017-01-31 LAB — POCT URINALYSIS DIPSTICK
Bilirubin, UA: NEGATIVE
GLUCOSE UA: NEGATIVE
Ketones, UA: NEGATIVE
Leukocytes, UA: NEGATIVE
NITRITE UA: NEGATIVE
PROTEIN UA: NEGATIVE
UROBILINOGEN UA: NEGATIVE U/dL — AB
pH, UA: 5 (ref 5.0–8.0)

## 2017-01-31 NOTE — Addendum Note (Signed)
Addended by: Susanne Greenhouse E on: 01/31/2017 01:44 PM   Modules accepted: Orders

## 2017-01-31 NOTE — Progress Notes (Signed)
GYNECOLOGY  VISIT   HPI: 41 y.o.   Single  African American  female   [phone] with No LMP recorded. Patient is not currently having periods (Reason: IUD).   here for follow up Vulvitis. She had a large, pruretic white raised patch on her mons 2 weeks ago that was treated with steroid ointment. The patch on her mons is improving, still slightly itchy. She has been using the steroid ointment BID for the last 2 weeks. She c/o a vaginal odor, notices it at the end of the day. Not sexually active since her last visit and negative vaginitis panel then. Not sure if the odor is her urine or her vagina.  No urinary urgency, frequency or pain.    GYNECOLOGIC HISTORY: No LMP recorded. Patient is not currently having periods (Reason: IUD). Contraception:IUD  Menopausal hormone therapy: none         OB History    Gravida Para Term Preterm AB Living   5 2 2  0 3 2   SAB TAB Ectopic Multiple Live Births   0 3 0 0 2         Patient Active Problem List   Diagnosis Date Noted  . Routine general medical examination at a health care facility 02/07/2016  . Essential hypertension 02/07/2016  . Respiratory tract infection 02/07/2016  . IUD (intrauterine device) in place 02/07/2016  . Obesity 12/25/2014  . Screen for STD (sexually transmitted disease) 12/25/2014  . Screening for cervical cancer 12/25/2014  . Need for prophylactic vaccination and inoculation against influenza 12/25/2014  . Encounter for health maintenance examination in adult 12/25/2014  . Sleep disturbance 12/25/2014  . Pelvic pain in female 12/25/2014  . Encounter for surveillance of contraceptive pills 02/05/2014  . Impaired fasting blood sugar 02/05/2014    Past Medical History:  Diagnosis Date  . Abnormal Pap smear 2005   paps normal 2007, 2008, 2009, 2010, 2013, 2014  . Back pain   . Chlamydia 2016  . Herpes gingivostomatitis   . History of EKG 02/09/09   normal  . History of pregnancy    5 pregnancies, 2 live births, 3  TABs  . HSV infection 2018   positive HSV I and II  . Hypertension   . Impaired fasting blood sugar 2015  . Liver hemangioma 2008   per ultrasound and 02/2013 MRI abdomen  . Obesity     Past Surgical History:  Procedure Laterality Date  . COLPOSCOPY  05/2003  . INTRAUTERINE DEVICE (IUD) INSERTION  2017   Mirena  . KNEE SURGERY  2000   R knee, ACL, MCL meniscus    Current Outpatient Medications  Medication Sig Dispense Refill  . amLODipine (NORVASC) 10 MG tablet Take 1 tablet (10 mg total) by mouth daily. 90 tablet 1  . betamethasone valerate ointment (VALISONE) 0.1 % Apply a pea sized amount BID for 1-2 weeks 15 g 0  . levonorgestrel (MIRENA) 20 MCG/24HR IUD 1 each by Intrauterine route once.    . valACYclovir (VALTREX) 500 MG tablet BID for 3 days prn outbreak 30 tablet 2   No current facility-administered medications for this visit.      ALLERGIES: Phentermine  Family History  Problem Relation Age of Onset  . Cancer Maternal Uncle        leukemia  . Tuberculosis Father   . Diabetes Father   . Hypertension Father   . Anesthesia problems Neg Hx   . Heart disease Neg Hx   . Hyperlipidemia Neg  Hx   . Stroke Neg Hx     Social History   Socioeconomic History  . Marital status: Single    Spouse name: Not on file  . Number of children: Not on file  . Years of education: Not on file  . Highest education level: Not on file  Social Needs  . Financial resource strain: Not on file  . Food insecurity - worry: Not on file  . Food insecurity - inability: Not on file  . Transportation needs - medical: Not on file  . Transportation needs - non-medical: Not on file  Occupational History  . Occupation: Chiropractor    Comment: Scientist, physiological  Tobacco Use  . Smoking status: Never Smoker  . Smokeless tobacco: Never Used  Substance and Sexual Activity  . Alcohol use: No  . Drug use: No  . Sexual activity: Yes    Partners: Male    Birth control/protection: IUD   Other Topics Concern  . Not on file  Social History Narrative   Single, relationship with son's father off and on x 3 years, 2 children, ages 4yo son and 88yo daughter.  Has 1st granddaughter.  Exercise - not much.  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    Review of Systems  Constitutional: Negative.   HENT: Negative.   Eyes: Negative.   Respiratory: Negative.   Cardiovascular: Negative.   Gastrointestinal: Negative.   Genitourinary:       Vaginal itching   Musculoskeletal: Negative.   Skin: Negative.   Neurological: Negative.   Endo/Heme/Allergies: Negative.   Psychiatric/Behavioral: Negative.     PHYSICAL EXAMINATION:    BP (!) 158/80 (BP Location: Right Arm, Patient Position: Sitting, Cuff Size: Large)   Pulse 76   Resp 16   Wt 267 lb (121.1 kg)   BMI 46.56 kg/m     General appearance: alert, cooperative and appears stated age  Pelvic: External genitalia:  no lesions, the white patch has resolved              Urethra:  normal appearing urethra with no masses, tenderness or lesions              Bartholins and Skenes: normal                 Vagina: normal appearing vagina with normal color and discharge, no lesions, no odor noted              Cervix: no lesion  Chaperone was present for exam.  ASSESSMENT Vulvitis, markedly improved with the steroid ointment C/o odor, not clear if it is vaginal or urine, no other symptoms    PLAN She will decrease the steroid to qd for one week, then stop Can use Vaseline Will send vaginitis panel and urine for ua, c&s Stay well hydrated Discussed changing her underwear if damp   An After Visit Summary was printed and given to the patient.

## 2017-02-01 LAB — URINALYSIS, MICROSCOPIC ONLY: Casts: NONE SEEN /lpf

## 2017-02-01 LAB — VAGINITIS/VAGINOSIS, DNA PROBE
Candida Species: NEGATIVE
Gardnerella vaginalis: NEGATIVE
Trichomonas vaginosis: NEGATIVE

## 2017-02-01 LAB — URINE CULTURE: Organism ID, Bacteria: NO GROWTH

## 2017-02-06 ENCOUNTER — Telehealth: Payer: Self-pay | Admitting: Medical

## 2017-02-06 NOTE — Telephone Encounter (Signed)
Call patient.  Her gynecologist sent me message about her concern.  See if Joanne Johns is still having odorous urine?  Is she taking supplements, vitamins,eating certain foods in abundance currently compared to other foods.   Her recent urine culture and labs didn't show urinary/vaginal infection per gynecology notes.

## 2017-02-06 NOTE — Telephone Encounter (Signed)
Called pt her voicemail  Is not set up could not l/m.

## 2017-02-07 NOTE — Telephone Encounter (Signed)
Called and spoke with  She said that only she said that change  Having coffee in morning. Per shane wants  Her keep a food dairy and when she has odor write down what she has eaten for day.

## 2017-03-14 ENCOUNTER — Telehealth: Payer: Self-pay | Admitting: Obstetrics and Gynecology

## 2017-03-14 NOTE — Telephone Encounter (Signed)
[facility] had contacted patient to schedule recommended thyroid ultrasound, patient never returned their calls for scheduling.  I called and spoke with the patient on 02/14/17 to encourage her to call [facility] to scheduled the ultrasound, see referral notes for details of call.  I provided patient with the phone number to [facility] and patient advised she would call to schedule appointment.  As of today, appointment has still not been established, I contacted the patient again today. She acknowledge she has not called to make appointment, but will do so by the week of this week.   cc: Dr Talbert Nan

## 2017-03-26 ENCOUNTER — Telehealth: Payer: Self-pay | Admitting: *Deleted

## 2017-03-26 NOTE — Telephone Encounter (Signed)
-----   Message from Salvadore Dom, MD sent at 03/23/2017 12:48 PM EST ----- Please call the patient, at her annual exam in 10/18 she was noted to have thyromegaly. Normal labs, a thyroid ultrasound was ordered, she never did it. Can you please speak with her about it.  Thanks, Sharee Pimple  ----- Message ----- From: SYSTEM Sent: 02/21/2017  12:05 AM To: Salvadore Dom, MD

## 2017-03-26 NOTE — Telephone Encounter (Signed)
Tried to contact patient- VM is full. Will try back later -eh

## 2017-03-28 NOTE — Telephone Encounter (Signed)
Spoke with patient and she is going to call now and schedule -eh

## 2017-05-11 ENCOUNTER — Telehealth: Payer: Self-pay | Admitting: Obstetrics and Gynecology

## 2017-05-11 MED ORDER — NAPROXEN SODIUM 550 MG PO TABS: 550.0000 mg | ORAL_TABLET | Freq: Two times a day (BID) | ORAL | 1 refills | Status: DC

## 2017-05-11 NOTE — Telephone Encounter (Signed)
Patient called regarding prescription refill.

## 2017-05-11 NOTE — Telephone Encounter (Signed)
Script sent  

## 2017-05-11 NOTE — Telephone Encounter (Signed)
Medication refill request: Naproxen  Last AEX:  01-17-17 Next AEX: 02-20-18  Last MMG (if hormonal medication request): Never Refill authorized: please advise

## 2017-05-17 ENCOUNTER — Encounter: Payer: Self-pay | Admitting: *Deleted

## 2017-06-15 ENCOUNTER — Ambulatory Visit (INDEPENDENT_AMBULATORY_CARE_PROVIDER_SITE_OTHER): Payer: BLUE CROSS/BLUE SHIELD | Admitting: Certified Nurse Midwife

## 2017-06-15 ENCOUNTER — Other Ambulatory Visit: Payer: Self-pay

## 2017-06-15 ENCOUNTER — Telehealth: Payer: Self-pay | Admitting: Obstetrics and Gynecology

## 2017-06-15 ENCOUNTER — Encounter: Payer: Self-pay | Admitting: Certified Nurse Midwife

## 2017-06-15 VITALS — BP 134/90 | HR 70 | Resp 16 | Ht 63.5 in | Wt 279.0 lb

## 2017-06-15 DIAGNOSIS — B9689 Other specified bacterial agents as the cause of diseases classified elsewhere: Secondary | ICD-10-CM | POA: Diagnosis not present

## 2017-06-15 DIAGNOSIS — N763 Subacute and chronic vulvitis: Secondary | ICD-10-CM | POA: Diagnosis not present

## 2017-06-15 DIAGNOSIS — Z113 Encounter for screening for infections with a predominantly sexual mode of transmission: Secondary | ICD-10-CM | POA: Diagnosis not present

## 2017-06-15 DIAGNOSIS — N898 Other specified noninflammatory disorders of vagina: Secondary | ICD-10-CM | POA: Diagnosis not present

## 2017-06-15 DIAGNOSIS — N760 Acute vaginitis: Secondary | ICD-10-CM | POA: Diagnosis not present

## 2017-06-15 MED ORDER — METRONIDAZOLE 0.75 % VA GEL: VAGINAL | 0 refills | Status: DC

## 2017-06-15 MED ORDER — NYSTATIN-TRIAMCINOLONE 100000-0.1 UNIT/GM-% EX OINT: 1.0000 "application " | TOPICAL_OINTMENT | Freq: Two times a day (BID) | CUTANEOUS | 0 refills | Status: DC

## 2017-06-15 NOTE — Telephone Encounter (Addendum)
VOID

## 2017-06-15 NOTE — Progress Notes (Signed)
42 y.o. Single African American female [phone] here with complaint of vaginal symptoms of itching, 4 days ago, treated self with Monistat and Diflucan and some decrease in symptoms and now symptoms of itching " have exploded". Now itching has increased and no discharge that she can see, except watery feel. Took baking soda tub bath last pm with some relief. Patient used Dial soap and uses dryer sheets with drying clothes and thought this might have caused the problem too. Last 24 hours have been miserable. No STD concerns but desires screening vaginal only.. Urinary symptoms none . Contraception is Mirena IUD.  ROS Per HPI  O:Healthy female WDWN Affect: normal, orientation x 3  Exam:Skin warm and dry Abdomen:soft, non tender  inguinal Lymph node: no enlargement or tenderness Pelvic exam: External genital: normal female with increase in pink on vulva only, some scaling and exudate no lesions wet prep taken BUS: negative Vagina: watery odorous discharge noted. Ph:5.0   ,Wet prep taken, Affirm taken Cervix: normal, non tender, no CMT, IUD string noted in cervix Uterus: normal, non tender Adnexa:normal, non tender, no masses or fullness noted   Wet Prep results:Koh, Saline + Clue cells vaginal occasional yeast bud on skin only   A:Normal pelvic exam BV R/O yeast vaginitis Yeast dermatitis STD screening Contraception Mirena IUD  P:Discussed findings of BV, Yeast dermatitis and etiology. Discussed Aveeno or baking soda sitz bath for comfort. Avoid moist clothes or pads for extended period of time. If working out in gym clothes for long periods of time change underwear   if possible. Coconut Oil use for skin protection prior to activity can be used to external skin for protection or dryness. Questions addressed and need to call if no change Rx: Metrogel see order with instructions Rx Mycolog ointment with instructions  Rv prn

## 2017-06-15 NOTE — Patient Instructions (Signed)

## 2017-06-16 LAB — GC/CHLAMYDIA PROBE AMP
Chlamydia trachomatis, NAA: NEGATIVE
NEISSERIA GONORRHOEAE BY PCR: NEGATIVE

## 2017-06-16 LAB — VAGINITIS/VAGINOSIS, DNA PROBE
CANDIDA SPECIES: NEGATIVE
Gardnerella vaginalis: POSITIVE — AB
TRICHOMONAS VAG: NEGATIVE

## 2017-07-23 ENCOUNTER — Ambulatory Visit (INDEPENDENT_AMBULATORY_CARE_PROVIDER_SITE_OTHER): Payer: BLUE CROSS/BLUE SHIELD | Admitting: Medical

## 2017-07-23 ENCOUNTER — Encounter: Payer: Self-pay | Admitting: Medical

## 2017-07-23 VITALS — BP 148/82 | HR 109 | Temp 98.4°F | Ht 64.25 in | Wt 283.4 lb

## 2017-07-23 DIAGNOSIS — I10 Essential (primary) hypertension: Secondary | ICD-10-CM | POA: Diagnosis not present

## 2017-07-23 DIAGNOSIS — Z6841 Body Mass Index (BMI) 40.0 and over, adult: Secondary | ICD-10-CM | POA: Diagnosis not present

## 2017-07-23 DIAGNOSIS — R009 Unspecified abnormalities of heart beat: Secondary | ICD-10-CM | POA: Diagnosis not present

## 2017-07-23 MED ORDER — PHENTERMINE-TOPIRAMATE ER 3.75-23 MG PO CP24: 1.0000 | ORAL_CAPSULE | ORAL | 0 refills | Status: DC

## 2017-07-23 MED ORDER — PHENTERMINE-TOPIRAMATE ER 7.5-46 MG PO CP24: 1.0000 | ORAL_CAPSULE | ORAL | 0 refills | Status: DC

## 2017-07-23 MED ORDER — AMLODIPINE BESYLATE 10 MG PO TABS: 10.0000 mg | ORAL_TABLET | Freq: Every day | ORAL | 1 refills | Status: DC

## 2017-07-23 NOTE — Progress Notes (Signed)
Subjective: Chief Complaint  Patient presents with  . Weight Loss  . Medication Management    amlodipine    Here for medication management and concerns for losing weight.  Hypertension - Here for follow-up of hypertension, compliant with Amlodipine 10mg  daily.  They are not monitoring home BP readings.   Cardiac symptoms: chest pain, chest pressure/discomfort, claudication and dyspnea. Patient denies: chest pain, dyspnea, irregular heart beat and lower extremity edema. Cardiovascular risk factors: hypertension and obesity (BMI >= 30 kg/m2).    Obesity - Here for consult regarding obesity.  Always hungry, has cravings.  Current diet: in general, a "healthy" diet  .  Using shakes and calorie reduction recently. Current exercise: started walking 3 days per week x last 3 weeks. Current medications to assist weight loss efforts: arbonze cleanse, uses dietary shake BID, just started this.   This diet plan uses fruit, no red meat, salads.    Prior medications prescribed Qsymia, Saxenda, Phentermine and Naltrexone.   She never took [place] due to costs. Contrave gave sensation of rocks in throat.    In the past has used gastric bypass diet, this involved shakes, no sugars, no breads.   No prior weight watcher.   Did see [facility], was advised shakes for meal replacement.  No sleep concerns.   No witnessed apnea.  Awakes rested.  No daytime somnolence.    Past Medical History:  Diagnosis Date  . Abnormal Pap smear 2005   paps normal 2007, 2008, 2009, 2010, 2013, 2014  . Back pain   . Chlamydia 2016  . Herpes gingivostomatitis   . History of EKG 02/09/09   normal  . History of pregnancy    5 pregnancies, 2 live births, 3 TABs  . HSV infection 2018   positive HSV I and II  . Hypertension   . Impaired fasting blood sugar 2015  . Liver hemangioma 2008   per ultrasound and 02/2013 MRI abdomen  . Obesity    Current Outpatient Medications on File Prior to Visit   Medication Sig Dispense Refill  . levonorgestrel (MIRENA) 20 MCG/24HR IUD 1 each by Intrauterine route once.    . naproxen sodium (ANAPROX DS) 550 MG tablet Take 1 tablet (550 mg total) by mouth 2 (two) times daily with a meal. 30 tablet 1  . valACYclovir (VALTREX) 500 MG tablet BID for 3 days prn outbreak (Patient not taking: Reported on 07/23/2017) 30 tablet 2   No current facility-administered medications on file prior to visit.    ROS as in subjective    Objective: BP (!) 148/82   Pulse (!) 109   Temp 98.4 F (36.9 C) (Oral)   Ht 5' 4.25" (1.632 m)   Wt 283 lb 6.4 oz (128.5 kg)   SpO2 96%   BMI 48.27 kg/m   BP Readings from Last 3 Encounters:  07/23/17 (!) 148/82  06/15/17 134/90  01/31/17 (!) 158/80    Wt Readings from Last 3 Encounters:  07/23/17 283 lb 6.4 oz (128.5 kg)  06/15/17 279 lb (126.6 kg)  01/31/17 267 lb (121.1 kg)   General appearance: alert, no distress, WD/WN,  Neck: supple, no lymphadenopathy, no thyromegaly, no masses Heart: slightly tachycardic today, RRR, normal S1, S2, no murmurs Lungs: CTA bilaterally, no wheezes, rhonchi, or rales ExT: no edema Pulses: 2+ symmetric, upper and lower extremities, normal cap refill Neuro: nonfocal exam   Assessment: Encounter Diagnoses  Name Primary?  . Essential hypertension Yes  . Class 3  severe obesity with serious comorbidity and body mass index (BMI) of 40.0 to 44.9 in adult, unspecified obesity type ([place])   . Elevated heart rate with elevated blood pressure and diagnosis of hypertension      Plan: Discussed symptom, prior efforts with weight loss strategies and medications.  Discussed possible medications, risks and benefits.   Reviewed several visits in chart records in recent months with normal BP and pulses.    Spent 30 min counseling on diet, exercise, medication options.  discussed Qsymia, risks/benefits of medication.    Begin trial of Qsymia.   Discussed elevated pulse, BP.   Recheck  103mo  Husna was seen today for weight loss and medication management.  Diagnoses and all orders for this visit:  Essential hypertension  Class 3 severe obesity with serious comorbidity and body mass index (BMI) of 40.0 to 44.9 in adult, unspecified obesity type (H[facility])  Elevated heart rate with elevated blood pressure and diagnosis of hypertension  Other orders -     amLODipine (NORVASC) 10 MG tablet; Take 1 tablet (10 mg total) by mouth daily. -     Phentermine-Topiramate (QSYMIA) 3.75-23 MG CP24; Take 1 capsule by mouth every morning. -     Phentermine-Topiramate (QSYMIA) 7.5-46 MG CP24; Take 1 capsule by mouth every morning.

## 2017-07-24 ENCOUNTER — Telehealth: Payer: Self-pay | Admitting: Medical

## 2017-07-24 ENCOUNTER — Encounter: Payer: Self-pay | Admitting: Medical

## 2017-07-24 NOTE — Telephone Encounter (Signed)
See email message back to patient

## 2017-07-24 NOTE — Telephone Encounter (Signed)
   Please call re Qsymia  Did not work

## 2017-08-01 ENCOUNTER — Other Ambulatory Visit (INDEPENDENT_AMBULATORY_CARE_PROVIDER_SITE_OTHER): Payer: BLUE CROSS/BLUE SHIELD

## 2017-08-01 DIAGNOSIS — I159 Secondary hypertension, unspecified: Secondary | ICD-10-CM | POA: Diagnosis not present

## 2017-08-02 ENCOUNTER — Other Ambulatory Visit: Payer: Self-pay | Admitting: Medical

## 2017-08-02 ENCOUNTER — Telehealth: Payer: Self-pay | Admitting: Medical

## 2017-08-02 ENCOUNTER — Other Ambulatory Visit: Payer: Self-pay

## 2017-08-02 MED ORDER — ATENOLOL 25 MG PO TABS: 25.0000 mg | ORAL_TABLET | Freq: Every day | ORAL | 2 refills | Status: DC

## 2017-08-02 NOTE — Telephone Encounter (Signed)
Pt notified. She's canceling her appointment for Monday and calling back to schedule 1 month out

## 2017-08-02 NOTE — Telephone Encounter (Signed)
C/t Amlodipine but lets add once daily Atenolol 25mg  low dose once daily to help control pulse and BP.     Lets get this under control before starting any other weight loss medication  In the meantime, I want her to set a goal to walk 45-60 minutes at least 4 days per week  Start using My Fitness Pal if not doing this already, and keep calories under 1800 per day   F/u 25mo on BP, pulse

## 2017-08-06 ENCOUNTER — Ambulatory Visit: Payer: Self-pay | Admitting: Medical

## 2017-09-28 DIAGNOSIS — N760 Acute vaginitis: Secondary | ICD-10-CM | POA: Diagnosis not present

## 2017-09-28 DIAGNOSIS — Z202 Contact with and (suspected) exposure to infections with a predominantly sexual mode of transmission: Secondary | ICD-10-CM | POA: Diagnosis not present

## 2017-09-28 DIAGNOSIS — B9689 Other specified bacterial agents as the cause of diseases classified elsewhere: Secondary | ICD-10-CM | POA: Diagnosis not present

## 2017-09-28 DIAGNOSIS — B373 Candidiasis of vulva and vagina: Secondary | ICD-10-CM | POA: Diagnosis not present

## 2017-11-14 ENCOUNTER — Encounter: Payer: Self-pay | Admitting: Family Medicine

## 2017-11-14 ENCOUNTER — Ambulatory Visit (INDEPENDENT_AMBULATORY_CARE_PROVIDER_SITE_OTHER): Payer: BLUE CROSS/BLUE SHIELD | Admitting: Medical

## 2017-11-14 VITALS — BP 120/84 | HR 86 | Temp 98.6°F | Resp 16 | Ht 64.0 in | Wt 280.6 lb

## 2017-11-14 DIAGNOSIS — R05 Cough: Secondary | ICD-10-CM

## 2017-11-14 DIAGNOSIS — J029 Acute pharyngitis, unspecified: Secondary | ICD-10-CM

## 2017-11-14 DIAGNOSIS — R059 Cough, unspecified: Secondary | ICD-10-CM

## 2017-11-14 DIAGNOSIS — J0100 Acute maxillary sinusitis, unspecified: Secondary | ICD-10-CM | POA: Diagnosis not present

## 2017-11-14 MED ORDER — AMOXICILLIN 875 MG PO TABS: 875.0000 mg | ORAL_TABLET | Freq: Two times a day (BID) | ORAL | 0 refills | Status: DC

## 2017-11-14 NOTE — Progress Notes (Signed)
Subjective: Chief Complaint  Patient presents with  . cold    chest congestion, cough, popping ears, pressure head, jaw painX Sunday   Here for chest congestion, cough, ears popping, gums sore, jaw pain, subjective fever, sore throat.   Using mucinex and nyquil. No nausea, no vomiting, feels tight in chest.  Several sick contacts.  No other aggravating or relieving factors. No other complaint.   Past Medical History:  Diagnosis Date  . Abnormal Pap smear 2005   paps normal 2007, 2008, 2009, 2010, 2013, 2014  . Back pain   . Chlamydia 2016  . Herpes gingivostomatitis   . History of EKG 02/09/09   normal  . History of pregnancy    5 pregnancies, 2 live births, 3 TABs  . HSV infection 2018   positive HSV I and II  . Hypertension   . Impaired fasting blood sugar 2015  . Liver hemangioma 2008   per ultrasound and 02/2013 MRI abdomen  . Obesity    Current Outpatient Medications on File Prior to Visit  Medication Sig Dispense Refill  . amLODipine (NORVASC) 10 MG tablet Take 1 tablet (10 mg total) by mouth daily. 90 tablet 1  . levonorgestrel (MIRENA) 20 MCG/24HR IUD 1 each by Intrauterine route once.    Marland Kitchen atenolol (TENORMIN) 25 MG tablet Take 1 tablet (25 mg total) by mouth daily. (Patient not taking: Reported on 11/14/2017) 30 tablet 2  . naproxen sodium (ANAPROX DS) 550 MG tablet Take 1 tablet (550 mg total) by mouth 2 (two) times daily with a meal. (Patient not taking: Reported on 11/14/2017) 30 tablet 1  . valACYclovir (VALTREX) 500 MG tablet BID for 3 days prn outbreak (Patient not taking: Reported on 11/14/2017) 30 tablet 2   No current facility-administered medications on file prior to visit.    ROS as in subjective     Objective: BP 120/84   Pulse 86   Temp 98.6 F (37 C) (Oral)   Resp 16   Ht 5\' 4"  (1.626 m)   Wt 280 lb 9.6 oz (127.3 kg)   SpO2 97%   BMI 48.16 kg/m   General appearance: Alert, WD/WN, no distress, ill appearing                             Skin:  warm, no rash, no diaphoresis                           Head: + sinus tenderness                             Eyes: conjunctiva normal, corneas clear, PERRLA                            Ears: pearly TMs, external ear canals normal                          Nose: septum midline, turbinates swollen, with erythema and mucoid discharge             Mouth/throat: MMM, tongue normal, mild pharyngeal erythema                           Neck: supple, no adenopathy, no thyromegaly, non tender  Heart: RRR, normal S1, S2, no murmurs                         Lungs: clear, no rhonchi, no wheezes, no rales                Extremities: no edema, non tender        Assessment: Encounter Diagnoses  Name Primary?  . Acute non-recurrent maxillary sinusitis Yes  . Cough   . Sore throat      Plan: Discussed symptoms, exam findings.   Advised rest, hydration, nasal saline, medication below, c/t OTC remedy she is using.  Lindyn was seen today for cold.  Diagnoses and all orders for this visit:  Acute non-recurrent maxillary sinusitis  Cough  Sore throat  Other orders -     amoxicillin (AMOXIL) 875 MG tablet; Take 1 tablet (875 mg total) by mouth 2 (two) times daily.   F/u prn

## 2017-11-14 NOTE — Patient Instructions (Signed)
Using Saline Nose Drops with Bulb Syringe A bulb syringe is used to clear your nose. You may use it when you have a stuffy nose, nasal congestion, sinus pressure, or sneezing.   SALINE SOLUTION You can buy nose drops at your local drug store. You can also make nose drops yourself. Mix 1 cup of water with  teaspoon of salt. Stir. Store this mixture at room temperature. Make a new batch daily.  USE THE BULB IN COMBINATION WITH SALINE NOSE DROPS  Squeeze the air out of the bulb before suctioning the saline mixture.  While still squeezing the bulb flat, place the tip of the bulb into the saline mixture.  Let air come back into the bulb.  This will suction up the saline mixture.  Gently flush one nostril at a time.  Salt water nose drops will then moisten your  congested nose and loosen secretions before suctioning.  Use the bulb syringe as directed below to suction.  USING THE BULB SYRINGE TO SUCTION  While still squeezing the bulb flat, place the tip of the bulb into a nostril. Let air come back into the bulb. The suction will pull snot out of the nose and into the bulb.  Repeat on the other nostril.  Squeeze syringe several times into a tissue.  CLEANING THE BULB SYRINGE  Clean the bulb syringe every day with hot soapy water.  Clean the inside of the bulb by squeezing the bulb while the tip is in soapy water.  Rinse by squeezing the bulb while the tip is in clean hot water.  Store the bulb with the tip side down on paper towel.  HOME CARE INSTRUCTIONS   Use saline nose drops often to keep the nose open and not stuffy.  Throw away used salt water. Make a new solution every time.  Do not use the same solution and dropper for another person  If you do not prefer to use nasal saline flush, other options include nasal saline spray or the [employer], both of which are available over the counter at your pharmacy.

## 2017-11-20 ENCOUNTER — Other Ambulatory Visit: Payer: Self-pay | Admitting: Medical

## 2017-11-20 MED ORDER — FLUCONAZOLE 150 MG PO TABS: ORAL_TABLET | ORAL | 0 refills | Status: DC

## 2017-12-18 ENCOUNTER — Ambulatory Visit (INDEPENDENT_AMBULATORY_CARE_PROVIDER_SITE_OTHER): Payer: BLUE CROSS/BLUE SHIELD | Admitting: Obstetrics and Gynecology

## 2017-12-18 ENCOUNTER — Encounter: Payer: Self-pay | Admitting: Obstetrics and Gynecology

## 2017-12-18 ENCOUNTER — Other Ambulatory Visit: Payer: Self-pay

## 2017-12-18 VITALS — BP 122/84 | HR 84 | Wt 277.6 lb

## 2017-12-18 DIAGNOSIS — Z113 Encounter for screening for infections with a predominantly sexual mode of transmission: Secondary | ICD-10-CM

## 2017-12-18 DIAGNOSIS — N760 Acute vaginitis: Secondary | ICD-10-CM

## 2017-12-18 DIAGNOSIS — B9689 Other specified bacterial agents as the cause of diseases classified elsewhere: Secondary | ICD-10-CM | POA: Diagnosis not present

## 2017-12-18 MED ORDER — METRONIDAZOLE 500 MG PO TABS: 500.0000 mg | ORAL_TABLET | Freq: Two times a day (BID) | ORAL | 0 refills | Status: DC

## 2017-12-18 NOTE — Progress Notes (Signed)
GYNECOLOGY  VISIT   HPI: 42 y.o.   Single Black or African American Not Hispanic or Latino  female   [phone] with No LMP recorded. (Menstrual status: IUD).   here for vaginal itching and discharge that started 1 week ago. The discharge is white, creamy. Has burning/itching. Slight odor.  No urinary c/o.  She has a new partner, together x 2 months, not using condoms.  She has an IUD, strings previously not seen. In place on ultrasound in the past.   GYNECOLOGIC HISTORY: No LMP recorded. (Menstrual status: IUD). Contraception:IUD Menopausal hormone therapy: None       OB History    Gravida  5   Para  2   Term  2   Preterm  0   AB  3   Living  2     SAB  0   TAB  3   Ectopic  0   Multiple  0   Live Births  2              Patient Active Problem List   Diagnosis Date Noted  . Routine general medical examination at a health care facility 02/07/2016  . Essential hypertension 02/07/2016  . Respiratory tract infection 02/07/2016  . IUD (intrauterine device) in place 02/07/2016  . Obesity 12/25/2014  . Screen for STD (sexually transmitted disease) 12/25/2014  . Screening for cervical cancer 12/25/2014  . Need for prophylactic vaccination and inoculation against influenza 12/25/2014  . Encounter for health maintenance examination in adult 12/25/2014  . Sleep disturbance 12/25/2014  . Pelvic pain in female 12/25/2014  . Encounter for surveillance of contraceptive pills 02/05/2014  . Impaired fasting blood sugar 02/05/2014    Past Medical History:  Diagnosis Date  . Abnormal Pap smear 2005   paps normal 2007, 2008, 2009, 2010, 2013, 2014  . Back pain   . Chlamydia 2016  . Herpes gingivostomatitis   . History of EKG 02/09/09   normal  . History of pregnancy    5 pregnancies, 2 live births, 3 TABs  . HSV infection 2018   positive HSV I and II  . Hypertension   . Impaired fasting blood sugar 2015  . Liver hemangioma 2008   per ultrasound and 02/2013 MRI  abdomen  . Obesity     Past Surgical History:  Procedure Laterality Date  . COLPOSCOPY  05/2003  . INTRAUTERINE DEVICE (IUD) INSERTION  2017   Mirena  . KNEE SURGERY  2000   R knee, ACL, MCL meniscus    Current Outpatient Medications  Medication Sig Dispense Refill  . amLODipine (NORVASC) 10 MG tablet Take 1 tablet (10 mg total) by mouth daily. 90 tablet 1  . levonorgestrel (MIRENA) 20 MCG/24HR IUD 1 each by Intrauterine route once.    . valACYclovir (VALTREX) 500 MG tablet BID for 3 days prn outbreak 30 tablet 2   No current facility-administered medications for this visit.      ALLERGIES: Patient has no known allergies.  Family History  Problem Relation Age of Onset  . Cancer Maternal Uncle        leukemia  . Tuberculosis Father   . Diabetes Father   . Hypertension Father   . Anesthesia problems Neg Hx   . Heart disease Neg Hx   . Hyperlipidemia Neg Hx   . Stroke Neg Hx     Social History   Socioeconomic History  . Marital status: Single    Spouse name: Not on file  .  Number of children: Not on file  . Years of education: Not on file  . Highest education level: Not on file  Occupational History  . Occupation: Chiropractor    Comment: Scientist, physiological  Social Needs  . Financial resource strain: Not on file  . Food insecurity:    Worry: Not on file    Inability: Not on file  . Transportation needs:    Medical: Not on file    Non-medical: Not on file  Tobacco Use  . Smoking status: Never Smoker  . Smokeless tobacco: Never Used  Substance and Sexual Activity  . Alcohol use: No  . Drug use: No  . Sexual activity: Yes    Partners: Male    Birth control/protection: IUD  Lifestyle  . Physical activity:    Days per week: Not on file    Minutes per session: Not on file  . Stress: Not on file  Relationships  . Social connections:    Talks on phone: Not on file    Gets together: Not on file    Attends religious service: Not on file    Active member  of club or organization: Not on file    Attends meetings of clubs or organizations: Not on file    Relationship status: Not on file  . Intimate partner violence:    Fear of current or ex partner: Not on file    Emotionally abused: Not on file    Physically abused: Not on file    Forced sexual activity: Not on file  Other Topics Concern  . Not on file  Social History Narrative   Single, relationship with son's father off and on x 3 years, 2 children, ages 41yo son and 89yo daughter.  Has 1st granddaughter.  Exercise - not much.  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    Review of Systems  Constitutional: Negative.   HENT: Negative.   Eyes: Negative.   Respiratory: Negative.   Cardiovascular: Negative.   Gastrointestinal: Negative.   Endocrine: Negative.   Genitourinary: Positive for vaginal discharge.       Vaginal itching  Musculoskeletal: Negative.   Skin: Negative.   Allergic/Immunologic: Negative.   Neurological: Negative.   Hematological: Negative.   Psychiatric/Behavioral: Negative.     PHYSICAL EXAMINATION:    BP 122/84 (BP Location: Right Arm, Patient Position: Sitting, Cuff Size: Normal)   Pulse 84   Wt 277 lb 9.6 oz (125.9 kg)   BMI 47.65 kg/m     General appearance: alert, cooperative and appears stated age  Pelvic: External genitalia:  no lesions              Urethra:  normal appearing urethra with no masses, tenderness or lesions              Bartholins and Skenes: normal                 Vagina: normal appearing vagina with normal color. Minimal increase in watery, white vaginal d/c              Cervix: no cervical motion tenderness, no lesions and IUD string not seen              Bimanual Exam:  Uterus:  normal size, contour, position, consistency, mobility, non-tender and anteverted              Adnexa: no mass, fullness, tenderness  Rectovaginal: no              Anus:  Small anal tag  Chaperone was  present for exam.  Wet prep: + clue, no trich, rare wbc KOH: no yeast PH: 5.5   ASSESSMENT Bacterial vaginitis IUD string missing, no change, in place on ultrasound Screening STD    PLAN Flagyl x 7 days, no ETOH Use Vaseline externally as needed externally Screening STD   An After Visit Summary was printed and given to the patient.

## 2017-12-18 NOTE — Patient Instructions (Signed)
Bacterial Vaginosis Bacterial vaginosis is a vaginal infection that occurs when the normal balance of bacteria in the vagina is disrupted. It results from an overgrowth of certain bacteria. This is the most common vaginal infection among women ages 15-44. Because bacterial vaginosis increases your risk for STIs (sexually transmitted infections), getting treated can help reduce your risk for chlamydia, gonorrhea, herpes, and HIV (human immunodeficiency virus). Treatment is also important for preventing complications in pregnant women, because this condition can cause an early (premature) delivery. What are the causes? This condition is caused by an increase in harmful bacteria that are normally present in small amounts in the vagina. However, the reason that the condition develops is not fully understood. What increases the risk? The following factors may make you more likely to develop this condition:  Having a new sexual partner or multiple sexual partners.  Having unprotected sex.  Douching.  Having an intrauterine device (IUD).  Smoking.  Drug and alcohol abuse.  Taking certain antibiotic medicines.  Being pregnant.  You cannot get bacterial vaginosis from toilet seats, bedding, swimming pools, or contact with objects around you. What are the signs or symptoms? Symptoms of this condition include:  Grey or white vaginal discharge. The discharge can also be watery or foamy.  A fish-like odor with discharge, especially after sexual intercourse or during menstruation.  Itching in and around the vagina.  Burning or pain with urination.  Some women with bacterial vaginosis have no signs or symptoms. How is this diagnosed? This condition is diagnosed based on:  Your medical history.  A physical exam of the vagina.  Testing a sample of vaginal fluid under a microscope to look for a large amount of bad bacteria or abnormal cells. Your health care provider may use a cotton swab  or a small wooden spatula to collect the sample.  How is this treated? This condition is treated with antibiotics. These may be given as a pill, a vaginal cream, or a medicine that is put into the vagina (suppository). If the condition comes back after treatment, a second round of antibiotics may be needed. Follow these instructions at home: Medicines  Take over-the-counter and prescription medicines only as told by your health care provider.  Take or use your antibiotic as told by your health care provider. Do not stop taking or using the antibiotic even if you start to feel better. General instructions  If you have a female sexual partner, tell her that you have a vaginal infection. She should see her health care provider and be treated if she has symptoms. If you have a female sexual partner, he does not need treatment.  During treatment: ? Avoid sexual activity until you finish treatment. ? Do not douche. ? Avoid alcohol as directed by your health care provider. ? Avoid breastfeeding as directed by your health care provider.  Drink enough water and fluids to keep your urine clear or pale yellow.  Keep the area around your vagina and rectum clean. ? Wash the area daily with warm water. ? Wipe yourself from front to back after using the toilet.  Keep all follow-up visits as told by your health care provider. This is important. How is this prevented?  Do not douche.  Wash the outside of your vagina with warm water only.  Use protection when having sex. This includes latex condoms and dental dams.  Limit how many sexual partners you have. To help prevent bacterial vaginosis, it is best to have sex with just   one partner (monogamous).  Make sure you and your sexual partner are tested for STIs.  Wear cotton or cotton-lined underwear.  Avoid wearing tight pants and pantyhose, especially during summer.  Limit the amount of alcohol that you drink.  Do not use any products that  contain nicotine or tobacco, such as cigarettes and e-cigarettes. If you need help quitting, ask your health care provider.  Do not use illegal drugs. Where to find more information:  Centers for Disease Control and Prevention: www.cdc.gov/std  American Sexual Health Association (ASHA): www.ashastd.org  [place]. Department of Health and Human Services, Office on Women's Health: www.womenshealth.gov/ or https://www.womenshealth.gov/a-z-topics/bacterial-vaginosis Contact a health care provider if:  Your symptoms do not improve, even after treatment.  You have more discharge or pain when urinating.  You have a fever.  You have pain in your abdomen.  You have pain during sex.  You have vaginal bleeding between periods. Summary  Bacterial vaginosis is a vaginal infection that occurs when the normal balance of bacteria in the vagina is disrupted.  Because bacterial vaginosis increases your risk for STIs (sexually transmitted infections), getting treated can help reduce your risk for chlamydia, gonorrhea, herpes, and HIV (human immunodeficiency virus). Treatment is also important for preventing complications in pregnant women, because the condition can cause an early (premature) delivery.  This condition is treated with antibiotic medicines. These may be given as a pill, a vaginal cream, or a medicine that is put into the vagina (suppository). This information is not intended to replace advice given to you by your health care provider. Make sure you discuss any questions you have with your health care provider. Document Released: 03/06/2005 Document Revised: 07/10/2016 Document Reviewed: 11/20/2015 Elsevier Interactive Patient Education  2018 Elsevier Inc.  

## 2017-12-19 LAB — CHLAMYDIA/GONOCOCCUS/TRICHOMONAS, NAA
Chlamydia by NAA: NEGATIVE
Gonococcus by NAA: NEGATIVE
Trich vag by NAA: NEGATIVE

## 2017-12-19 LAB — HEP, RPR, HIV PANEL
HEP B S AG: NEGATIVE
HIV SCREEN 4TH GENERATION: NONREACTIVE
RPR: NONREACTIVE

## 2017-12-19 LAB — HEPATITIS C ANTIBODY: Hep C Virus Ab: <0.1 s/co ratio (ref 0.0–0.9)

## 2018-02-01 ENCOUNTER — Encounter: Payer: Self-pay | Admitting: Obstetrics and Gynecology

## 2018-02-01 ENCOUNTER — Other Ambulatory Visit: Payer: Self-pay | Admitting: Obstetrics and Gynecology

## 2018-02-01 MED ORDER — VALACYCLOVIR HCL 500 MG PO TABS: ORAL_TABLET | ORAL | 1 refills | Status: DC

## 2018-02-01 NOTE — Telephone Encounter (Signed)
Patient sent the following correspondence through [facility]. Routing to triage to assist patient with request.  Good morning Dr. Talbert Nan,    I was wondering if I could get a refill on valACYclovir 500 MG tablet. I was unable to request it through the refill section. If so, I use the [facility] on [address].    Thank you,    Joanne Johns

## 2018-02-01 NOTE — Telephone Encounter (Signed)
Call to patient. Patient updated that prescription valtrex had been sent to pharmacy for her. Patient verbalized understanding and appreciative of phone call.   Encounter closed.

## 2018-02-01 NOTE — Telephone Encounter (Signed)
Medication refill request: Valtrex Last AEX:  01-17-17 JJ  Next AEX: 02-20-18  Last MMG (if hormonal medication request): none Refill authorized: 01-17-17 #30, 2RF. Today, please advise.   Routing to covering provider.   Medication pended for #30 as patient has appointment 02-20-18. Please advise.

## 2018-02-16 ENCOUNTER — Other Ambulatory Visit: Payer: Self-pay | Admitting: Medical

## 2018-02-18 NOTE — Progress Notes (Signed)
42 y.o. [id number] Single Black or African American Not Hispanic or Latino female here for annual exam. She has a mirena IUD, placed in 7/17. Strings previously not seen, in place on ultrasound. No regular cycles, occasional spotting, no pain.  Last year a thyroid ultrasound was ordered, never done.  Same partner for 4 months. Negative STD testing in 2023-01-27, would like retesting. She notes a very subtle vaginal odor. No dyspareunia.     No LMP recorded. (Menstrual status: IUD).          Sexually active: Yes.    The current method of family planning is IUD.    Exercising: No.  The patient does not participate in regular exercise at present. Smoker:  no  Health Maintenance: Pap:  01/17/2017 NEG with NEG HR HPV, 12-25-14 WNL + HR HPV, - for HPV 16/18 History of abnormal Pap:  Yes, she has had a colposcopy in the distant past, no surgery on her cervix.  MMG:  Never TDaP:  05-04-11 Gardasil: No    reports that she has never smoked. She has never used smokeless tobacco. She reports that she does not drink alcohol or use drugs.  Daughter is 68, has a 35 year old daughter, pregnant. Son is 6.  She graduated from college last year. 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   Past Medical History:  Diagnosis Date  . Abnormal Pap smear 2005   paps normal 2007, 2008, 2009, 2010, 2013, 2014  . Back pain   . Chlamydia 2016  . Herpes gingivostomatitis   . History of EKG 02/09/09   normal  . History of pregnancy    5 pregnancies, 2 live births, 3 TABs  . HSV infection 2018   positive HSV I and II  . Hypertension   . Impaired fasting blood sugar 2015  . Liver hemangioma 2008   per ultrasound and 02/2013 MRI abdomen  . Obesity     Past Surgical History:  Procedure Laterality Date  . COLPOSCOPY  05/2003  . INTRAUTERINE DEVICE (IUD) INSERTION  2017   Mirena  . KNEE SURGERY  2000   R knee, ACL, MCL meniscus    Current Outpatient Medications  Medication Sig  Dispense Refill  . levonorgestrel (MIRENA) 20 MCG/24HR IUD 1 each by Intrauterine route once.    . valACYclovir (VALTREX) 500 MG tablet BID for 3 days prn outbreak 30 tablet 1  . amLODipine (NORVASC) 10 MG tablet TAKE 1 TABLET BY MOUTH EVERY DAY (Patient not taking: Reported on 02/20/2018) 90 tablet 1   No current facility-administered medications for this visit.     Family History  Problem Relation Age of Onset  . Cancer Maternal Uncle        leukemia  . Tuberculosis Father   . Diabetes Father   . Hypertension Father   . Heart attack Father   . Anesthesia problems Neg Hx   . Heart disease Neg Hx   . Hyperlipidemia Neg Hx   . Stroke Neg Hx   Father died in January 27, 2023 of a MI, h/o ETOH, Drug abuse, HTN. He was 66.   Review of Systems  Constitutional: Negative.   HENT: Negative.   Eyes: Negative.   Respiratory: Negative.   Cardiovascular: Negative.   Gastrointestinal: Negative.   Endocrine: Negative.   Genitourinary: Negative.   Musculoskeletal: Negative.   Skin: Negative.   Allergic/Immunologic: Negative.   Neurological: Negative.   Hematological: Negative.   Psychiatric/Behavioral: Negative.     Exam:  BP 138/90 (BP Location: Right Arm, Patient Position: Sitting, Cuff Size: Large)   Pulse 72   Ht 5' 3.58" (1.615 m)   Wt 272 lb 3.2 oz (123.5 kg)   BMI 47.34 kg/m   Weight change: @WEIGHTCHANGE @ Height:   Height: 5' 3.58" (161.5 cm)  Ht Readings from Last 3 Encounters:  02/20/18 5' 3.58" (1.615 m)  11/14/17 5\' 4"  (1.626 m)  07/23/17 5' 4.25" (1.632 m)    General appearance: alert, cooperative and appears stated age Head: Normocephalic, without obvious abnormality, atraumatic Neck: no adenopathy, supple, symmetrical, trachea midline and thyroid enlarged Lungs: clear to auscultation bilaterally Cardiovascular: regular rate and rhythm Breasts: normal appearance, no masses or tenderness Abdomen: soft, non-tender; non distended,  no masses,  no organomegaly Extremities:  extremities normal, atraumatic, no cyanosis or edema Skin: Skin color, texture, turgor normal. No rashes or lesions Lymph nodes: Cervical, supraclavicular, and axillary nodes normal. No abnormal inguinal nodes palpated Neurologic: Grossly normal   Pelvic: External genitalia:  no lesions              Urethra:  normal appearing urethra with no masses, tenderness or lesions              Bartholins and Skenes: normal                 Vagina: normal appearing vagina with normal color and discharge, no lesions              Cervix: no lesions               Bimanual Exam:  Uterus:  normal size, contour, position, consistency, mobility, non-tender              Adnexa: no mass, fullness, tenderness               Rectovaginal: Confirms               Anus:  normal sphincter tone, no lesions  Chaperone was present for exam.  A:  Well Woman with normal exam  H/O HTN, not on medication  Obesity  IUD check  Thyromegaly  P:   No pap this year  Screening STD  Screening labs (fasting)  TSH, HgbA1C  F/U with primary MD for HTN  Discussed breast self exam  Discussed calcium and vit D intake  Mammogram overdue  Thyroid ultrasound

## 2018-02-20 ENCOUNTER — Other Ambulatory Visit: Payer: Self-pay

## 2018-02-20 ENCOUNTER — Ambulatory Visit (INDEPENDENT_AMBULATORY_CARE_PROVIDER_SITE_OTHER): Payer: BLUE CROSS/BLUE SHIELD | Admitting: Obstetrics and Gynecology

## 2018-02-20 ENCOUNTER — Encounter: Payer: Self-pay | Admitting: Obstetrics and Gynecology

## 2018-02-20 ENCOUNTER — Other Ambulatory Visit: Payer: Self-pay | Admitting: *Deleted

## 2018-02-20 VITALS — BP 138/90 | HR 72 | Ht 63.58 in | Wt 272.2 lb

## 2018-02-20 DIAGNOSIS — Z01419 Encounter for gynecological examination (general) (routine) without abnormal findings: Secondary | ICD-10-CM

## 2018-02-20 DIAGNOSIS — Z0000 Encounter for general adult medical examination without abnormal findings: Secondary | ICD-10-CM | POA: Diagnosis not present

## 2018-02-20 DIAGNOSIS — Z6841 Body Mass Index (BMI) 40.0 and over, adult: Secondary | ICD-10-CM | POA: Diagnosis not present

## 2018-02-20 DIAGNOSIS — Z113 Encounter for screening for infections with a predominantly sexual mode of transmission: Secondary | ICD-10-CM

## 2018-02-20 DIAGNOSIS — E010 Iodine-deficiency related diffuse (endemic) goiter: Secondary | ICD-10-CM

## 2018-02-20 DIAGNOSIS — N898 Other specified noninflammatory disorders of vagina: Secondary | ICD-10-CM | POA: Diagnosis not present

## 2018-02-20 NOTE — Patient Instructions (Signed)
EXERCISE AND DIET:  We recommended that you start or continue a regular exercise program for good health. Regular exercise means any activity that makes your heart beat faster and makes you sweat.  We recommend exercising at least 30 minutes per day at least 3 days a week, preferably 4 or 5.  We also recommend a diet low in fat and sugar.  Inactivity, poor dietary choices and obesity can cause diabetes, heart attack, stroke, and kidney damage, among others.    ALCOHOL AND SMOKING:  Women should limit their alcohol intake to no more than 7 drinks/beers/glasses of wine (combined, not each!) per week. Moderation of alcohol intake to this level decreases your risk of breast cancer and liver damage. And of course, no recreational drugs are part of a healthy lifestyle.  And absolutely no smoking or even second hand smoke. Most people know smoking can cause heart and lung diseases, but did you know it also contributes to weakening of your bones? Aging of your skin?  Yellowing of your teeth and nails?  CALCIUM AND VITAMIN D:  Adequate intake of calcium and Vitamin D are recommended.  The recommendations for exact amounts of these supplements seem to change often, but generally speaking 1,000 mg of calcium (between diet and supplement) and 800 units of Vitamin D per day seems prudent. Certain women may benefit from higher intake of Vitamin D.  If you are among these women, your doctor will have told you during your visit.    PAP SMEARS:  Pap smears, to check for cervical cancer or precancers,  have traditionally been done yearly, although recent scientific advances have shown that most women can have pap smears less often.  However, every woman still should have a physical exam from her gynecologist every year. It will include a breast check, inspection of the vulva and vagina to check for abnormal growths or skin changes, a visual exam of the cervix, and then an exam to evaluate the size and shape of the uterus and  ovaries.  And after 41 years of age, a rectal exam is indicated to check for rectal cancers. We will also provide age appropriate advice regarding health maintenance, like when you should have certain vaccines, screening for sexually transmitted diseases, bone density testing, colonoscopy, mammograms, etc.   MAMMOGRAMS:  All women over 24 years old should have a yearly mammogram. Many facilities now offer a "3D" mammogram, which may cost around $50 extra out of pocket. If possible,  we recommend you accept the option to have the 3D mammogram performed.  It both reduces the number of women who will be called back for extra views which then turn out to be normal, and it is better than the routine mammogram at detecting truly abnormal areas.    COLONOSCOPY:  Colonoscopy to screen for colon cancer is recommended for all women at age 53.  We know, you hate the idea of the prep.  We agree, BUT, having colon cancer and not knowing it is worse!!  Colon cancer so often starts as a polyp that can be seen and removed at colonscopy, which can quite literally save your life!  And if your first colonoscopy is normal and you have no family history of colon cancer, most women don't have to have it again for 10 years.  Once every ten years, you can do something that may end up saving your life, right?  We will be happy to help you get it scheduled when you are ready.  Be sure to check your insurance coverage so you understand how much it will cost.  It may be covered as a preventative service at no cost, but you should check your particular policy.      Breast Self-Awareness Breast self-awareness means being familiar with how your breasts look and feel. It involves checking your breasts regularly and reporting any changes to your health care provider. Practicing breast self-awareness is important. A change in your breasts can be a sign of a serious medical problem. Being familiar with how your breasts look and feel allows  you to find any problems early, when treatment is more likely to be successful. All women should practice breast self-awareness, including women who have had breast implants. How to do a breast self-exam One way to learn what is normal for your breasts and whether your breasts are changing is to do a breast self-exam. To do a breast self-exam: Look for Changes  1. Remove all the clothing above your waist. 2. Stand in front of a mirror in a room with good lighting. 3. Put your hands on your hips. 4. Push your hands firmly downward. 5. Compare your breasts in the mirror. Look for differences between them (asymmetry), such as: ? Differences in shape. ? Differences in size. ? Puckers, dips, and bumps in one breast and not the other. 6. Look at each breast for changes in your skin, such as: ? Redness. ? Scaly areas. 7. Look for changes in your nipples, such as: ? Discharge. ? Bleeding. ? Dimpling. ? Redness. ? A change in position. Feel for Changes  Carefully feel your breasts for lumps and changes. It is best to do this while lying on your back on the floor and again while sitting or standing in the shower or tub with soapy water on your skin. Feel each breast in the following way:  Place the arm on the side of the breast you are examining above your head.  Feel your breast with the other hand.  Start in the nipple area and make  inch (2 cm) overlapping circles to feel your breast. Use the pads of your three middle fingers to do this. Apply light pressure, then medium pressure, then firm pressure. The light pressure will allow you to feel the tissue closest to the skin. The medium pressure will allow you to feel the tissue that is a little deeper. The firm pressure will allow you to feel the tissue close to the ribs.  Continue the overlapping circles, moving downward over the breast until you feel your ribs below your breast.  Move one finger-width toward the center of the body.  Continue to use the  inch (2 cm) overlapping circles to feel your breast as you move slowly up toward your collarbone.  Continue the up and down exam using all three pressures until you reach your armpit.  Write Down What You Find  Write down what is normal for each breast and any changes that you find. Keep a written record with breast changes or normal findings for each breast. By writing this information down, you do not need to depend only on memory for size, tenderness, or location. Write down where you are in your menstrual cycle, if you are still menstruating. If you are having trouble noticing differences in your breasts, do not get discouraged. With time you will become more familiar with the variations in your breasts and more comfortable with the exam. How often should I examine my breasts? Examine   your breasts every month. If you are breastfeeding, the best time to examine your breasts is after a feeding or after using a breast pump. If you menstruate, the best time to examine your breasts is 5-7 days after your period is over. During your period, your breasts are lumpier, and it may be more difficult to notice changes. When should I see my health care provider? See your health care provider if you notice:  A change in shape or size of your breasts or nipples.  A change in the skin of your breast or nipples, such as a reddened or scaly area.  Unusual discharge from your nipples.  A lump or thick area that was not there before.  Pain in your breasts.  Anything that concerns you.  This information is not intended to replace advice given to you by your health care provider. Make sure you discuss any questions you have with your health care provider. Document Released: 03/06/2005 Document Revised: 08/12/2015 Document Reviewed: 01/24/2015 Elsevier Interactive Patient Education  2018 [employer]  

## 2018-02-21 LAB — VAGINITIS/VAGINOSIS, DNA PROBE
CANDIDA SPECIES: NEGATIVE
Gardnerella vaginalis: POSITIVE — AB
Trichomonas vaginosis: NEGATIVE

## 2018-02-21 LAB — GC/CHLAMYDIA PROBE AMP
Chlamydia trachomatis, NAA: NEGATIVE
Neisseria gonorrhoeae by PCR: NEGATIVE

## 2018-02-22 ENCOUNTER — Telehealth: Payer: Self-pay

## 2018-02-22 LAB — CBC
HEMATOCRIT: 41 % (ref 34.0–46.6)
HEMOGLOBIN: 13.4 g/dL (ref 11.1–15.9)
MCH: 29.4 pg (ref 26.6–33.0)
MCHC: 32.7 g/dL (ref 31.5–35.7)
MCV: 90 fL (ref 79–97)
Platelets: 310 10*3/uL (ref 150–450)
RBC: 4.56 x10E6/uL (ref 3.77–5.28)
RDW: 13.2 % (ref 12.3–15.4)
WBC: 7 10*3/uL (ref 3.4–10.8)

## 2018-02-22 LAB — TSH: TSH: 1.63 u[IU]/mL (ref 0.450–4.500)

## 2018-02-22 LAB — LIPID PANEL
Chol/HDL Ratio: 2.5 ratio (ref 0.0–4.4)
Cholesterol, Total: 202 mg/dL — ABNORMAL HIGH (ref 100–199)
HDL: 80 mg/dL (ref >39)
LDL Calculated: 110 mg/dL — ABNORMAL HIGH (ref 0–99)
TRIGLYCERIDES: 58 mg/dL (ref 0–149)
VLDL Cholesterol Cal: 12 mg/dL (ref 5–40)

## 2018-02-22 LAB — HEMOGLOBIN A1C
Est. average glucose Bld gHb Est-mCnc: 117 mg/dL
Hgb A1c MFr Bld: 5.7 % — ABNORMAL HIGH (ref 4.8–5.6)

## 2018-02-22 LAB — COMPREHENSIVE METABOLIC PANEL
ALT: 13 IU/L (ref 0–32)
AST: 9 IU/L (ref 0–40)
Albumin/Globulin Ratio: 2.1 (ref 1.2–2.2)
Albumin: 4.5 g/dL (ref 3.5–5.5)
Alkaline Phosphatase: 69 IU/L (ref 39–117)
BUN/Creatinine Ratio: 11 (ref 9–23)
BUN: 12 mg/dL (ref 6–24)
Bilirubin Total: 0.2 mg/dL (ref 0.0–1.2)
CHLORIDE: 105 mmol/L (ref 96–106)
CO2: 19 mmol/L — ABNORMAL LOW (ref 20–29)
Calcium: 9.8 mg/dL (ref 8.7–10.2)
Creatinine, Ser: 1.09 mg/dL — ABNORMAL HIGH (ref 0.57–1.00)
GFR calc Af Amer: 73 mL/min/{1.73_m2} (ref >59)
GFR calc non Af Amer: 63 mL/min/{1.73_m2} (ref >59)
GLUCOSE: 93 mg/dL (ref 65–99)
Globulin, Total: 2.1 g/dL (ref 1.5–4.5)
Potassium: 4.7 mmol/L (ref 3.5–5.2)
Sodium: 141 mmol/L (ref 134–144)
Total Protein: 6.6 g/dL (ref 6.0–8.5)

## 2018-02-22 LAB — HIV ANTIBODY (ROUTINE TESTING W REFLEX): HIV Screen 4th Generation wRfx: NONREACTIVE

## 2018-02-22 LAB — RPR: RPR Ser Ql: NONREACTIVE

## 2018-02-22 NOTE — Telephone Encounter (Signed)
Left message for callback. Message left for patient to callback on Monday & ask to speak to one of our triage nurses, as I will be out of the office on Monday.

## 2018-02-22 NOTE — Telephone Encounter (Signed)
-----   Message from Salvadore Dom, MD sent at 02/22/2018  1:19 PM EST ----- Please inform the patient that her vaginitis probe was + for BV and treat with flagyl (either oral or vaginal, her choice), no ETOH while on Flagyl.  Oral: Flagyl 500 mg BID x 7 days, or Vaginal: Metrogel, 1 applicator per vagina q day x 5 days. Her creatinine is very mildly elevated (can occur with dehydration). She has pre-diabetes The rest of her lab work was normal. She should have a repeat renal panel after she is well hydrated. She needs to f/u with her primary for her HTN and pre-diabetes, so she can have it done there CC: Dorothea Ogle, PA

## 2018-02-25 ENCOUNTER — Ambulatory Visit
Admission: RE | Admit: 2018-02-25 | Discharge: 2018-02-25 | Disposition: A | Discharge status: Home / Self Care | Payer: BLUE CROSS/BLUE SHIELD | Source: Ambulatory Visit | Attending: Obstetrics and Gynecology | Admitting: Obstetrics and Gynecology

## 2018-02-25 ENCOUNTER — Telehealth: Payer: Self-pay | Admitting: Medical

## 2018-02-25 DIAGNOSIS — E010 Iodine-deficiency related diffuse (endemic) goiter: Secondary | ICD-10-CM

## 2018-02-25 MED ORDER — METRONIDAZOLE 500 MG PO TABS: 500.0000 mg | ORAL_TABLET | Freq: Two times a day (BID) | ORAL | 0 refills | Status: DC

## 2018-02-25 NOTE — Telephone Encounter (Signed)
please schedule her a follow up appt on BP and other concerns  I received info from her gynecologist about high BP and other concern

## 2018-02-25 NOTE — Telephone Encounter (Signed)
Spoke with patient, advised of all results as seen below per Dr. Talbert Nan. Rx for flagyl PO to verified pharmacy. Confirmed PCP on file, 02/20/18 TSH, lipid panel, HgbA1c, CMP and CBC faxed via Epic to PCP. Patient will call directly to schedule. Patient verbalizes understanding and is agreeable. Encounter closed.

## 2018-02-25 NOTE — Telephone Encounter (Signed)
Called patient, she will call back and schedule her appt.

## 2018-03-18 ENCOUNTER — Other Ambulatory Visit: Payer: Self-pay | Admitting: Obstetrics and Gynecology

## 2018-03-18 DIAGNOSIS — Z1231 Encounter for screening mammogram for malignant neoplasm of breast: Secondary | ICD-10-CM

## 2018-04-17 ENCOUNTER — Ambulatory Visit
Admission: RE | Admit: 2018-04-17 | Discharge: 2018-04-17 | Disposition: A | Discharge status: Home / Self Care | Payer: BLUE CROSS/BLUE SHIELD | Source: Ambulatory Visit | Attending: Obstetrics and Gynecology | Admitting: Obstetrics and Gynecology

## 2018-04-17 DIAGNOSIS — Z1231 Encounter for screening mammogram for malignant neoplasm of breast: Secondary | ICD-10-CM | POA: Diagnosis not present

## 2018-04-25 ENCOUNTER — Telehealth: Payer: Self-pay | Admitting: Obstetrics and Gynecology

## 2018-04-25 ENCOUNTER — Encounter: Payer: Self-pay | Admitting: Obstetrics and Gynecology

## 2018-04-25 NOTE — Telephone Encounter (Signed)
Please let the patient know that we don't typically prescribe medication over the phone for BV. She has also had 2 documented infections since 7/19. If she has another one within a year I would recommend suppression. I wouldn't want to put her on suppression without being sure that she has BV

## 2018-04-25 NOTE — Telephone Encounter (Signed)
Pt sent mychart message with request for treatment of bacterial vaginosis.   Hx bacterial vaginosis x 2 in 2019, treated with oral flagyl.

## 2018-04-25 NOTE — Telephone Encounter (Signed)
Hi Dr. Talbert Nan,    I was wondering if I could get a refill on Metronidazole. I am only experiencing a mild odor. I am going out of town tomorrow and do not want to be uncomfortable. If I think it's something more I will schedule an appointment when I return. If you agree, I use the [facility] on [address].    Thank you,    Joanne Johns

## 2018-04-26 NOTE — Telephone Encounter (Signed)
Spoke with patient and she is given message from Dr. Talbert Nan.  She declines to schedule an appointment at this time.  Advised patient to call back for an appointment and can discuss suppression with Dr. Talbert Nan.

## 2018-09-01 DIAGNOSIS — J069 Acute upper respiratory infection, unspecified: Secondary | ICD-10-CM | POA: Diagnosis not present

## 2018-09-01 DIAGNOSIS — I10 Essential (primary) hypertension: Secondary | ICD-10-CM | POA: Diagnosis not present

## 2018-09-04 ENCOUNTER — Ambulatory Visit (INDEPENDENT_AMBULATORY_CARE_PROVIDER_SITE_OTHER): Payer: BLUE CROSS/BLUE SHIELD | Admitting: Medical

## 2018-09-04 ENCOUNTER — Telehealth: Payer: Self-pay | Admitting: Medical

## 2018-09-04 ENCOUNTER — Other Ambulatory Visit: Payer: Self-pay

## 2018-09-04 ENCOUNTER — Encounter: Payer: Self-pay | Admitting: Medical

## 2018-09-04 VITALS — BP 141/90 | Temp 98.3°F | Ht 64.0 in | Wt 255.0 lb

## 2018-09-04 DIAGNOSIS — R05 Cough: Secondary | ICD-10-CM | POA: Diagnosis not present

## 2018-09-04 DIAGNOSIS — R51 Headache: Secondary | ICD-10-CM

## 2018-09-04 DIAGNOSIS — R519 Headache, unspecified: Secondary | ICD-10-CM

## 2018-09-04 DIAGNOSIS — R059 Cough, unspecified: Secondary | ICD-10-CM

## 2018-09-04 DIAGNOSIS — M545 Low back pain, unspecified: Secondary | ICD-10-CM

## 2018-09-04 DIAGNOSIS — R6883 Chills (without fever): Secondary | ICD-10-CM

## 2018-09-04 DIAGNOSIS — B349 Viral infection, unspecified: Secondary | ICD-10-CM

## 2018-09-04 NOTE — Progress Notes (Signed)
Subjective:     Patient ID: Joanne Johns, female   DOB: March 04, 1976, 43 y.o.   MRN: [id number]  This visit type was conducted due to national recommendations for restrictions regarding the COVID-19 Pandemic (e.g. social distancing) in an effort to limit this patient's exposure and mitigate transmission in our community.  Due to their co-morbid illnesses, this patient is at least at moderate risk for complications without adequate follow up.  This format is felt to be most appropriate for this patient at this time.    Documentation for virtual audio and video telecommunications through Zoom encounter:  The patient was located at home. The provider was located in the office. The patient did consent to this visit and is aware of possible charges through their insurance for this visit.  The other persons participating in this telemedicine service were none. Time spent on call was 15 minutes and in review of previous records >15 minutes total.  This virtual service is not related to other E/M service within previous 7 days.   HPI Chief Complaint  Patient presents with  . Back Pain    lower back bilateral sides, lower abdominal pain, no urinary problems    Virtual consult today for low back pain and symptoms.  She reports going to urgent care about 6 days ago with headache, ear popping, head congestion, chills, hot flash, sweats, cough, and at that time was taking NyQuil, Sudafed, Mucinex.  [facility] [address].  She was told she had a common cold, supposedly had normal vital signs.  She reports that she had no specific test, no COVID testing.  No chest x-ray.  She denies COVID contacts.  She notes that she has not been at work in 3 months.  She works for [employer].  She lives at home with her young son.  They have not really been out of the house but have had some friends over from time to time.  She believes her COVID risk as far as exposure is low.   However the last 6 days her symptoms have gotten a little better but at times the body aches would be worse.  Then in the last 2 days she is developed some low back pain that was worse today and yesterday.  She denies shortness of breath, no chest pain, no urinary symptoms, no nausea, no vomiting, no diarrhea, no rash, no sick contacts in general.  No other symptoms.No other aggravating or relieving factors. No other complaint.  Past Medical History:  Diagnosis Date  . Abnormal Pap smear 2005   paps normal 2007, 2008, 2009, 2010, 2013, 2014  . Back pain   . Chlamydia 2016  . Herpes gingivostomatitis   . History of EKG 02/09/09   normal  . History of pregnancy    5 pregnancies, 2 live births, 3 TABs  . HSV infection 2018   positive HSV I and II  . Hypertension   . Impaired fasting blood sugar 2015  . Liver hemangioma 2008   per ultrasound and 02/2013 MRI abdomen  . Obesity    Current Outpatient Medications on File Prior to Visit  Medication Sig Dispense Refill  . levonorgestrel (MIRENA) 20 MCG/24HR IUD 1 each by Intrauterine route once.    Marland Kitchen amLODipine (NORVASC) 10 MG tablet TAKE 1 TABLET BY MOUTH EVERY DAY (Patient not taking: Reported on 02/20/2018) 90 tablet 1  . metroNIDAZOLE (FLAGYL) 500 MG tablet Take 1 tablet (500 mg total)  by mouth 2 (two) times daily. (Patient not taking: Reported on 09/04/2018) 14 tablet 0  . valACYclovir (VALTREX) 500 MG tablet BID for 3 days prn outbreak (Patient not taking: Reported on 09/04/2018) 30 tablet 1   No current facility-administered medications on file prior to visit.      Review of Systems As in subjective    Objective:   Physical Exam  BP (!) 141/90 Comment: taken on last Sunday ( was taking Sudafd)  Temp 98.3 F (36.8 C)   Ht 5\' 4"  (1.626 m)   Wt 255 lb (115.7 kg)   BMI 43.77 kg/m   Due to coronavirus pandemic stay at home measures, patient visit was virtual and they were not examined in person.   Gen: wd, wn, nad       Assessment:     Encounter Diagnoses  Name Primary?  . Acute low back pain without sciatica, unspecified back pain laterality Yes  . Nonintractable headache, unspecified chronicity pattern, unspecified headache type   . Chills   . Cough   . Viral syndrome        Plan:     We discussed the limitations of virtual visit.  We discussed her recent urgent care visit.  We discussed her symptoms suggest that she has had a viral respiratory tract infection.  Based on her current appearance and current symptoms she does not seem seriously ill.  We discussed the fact that the symptoms could actually be suggestive of COVID-19 as well.  She seems to downplay this.  We also discussed the fact that she is 6 days into this seems to be getting better other than the new back pain.  I advised good hydration, advised on Tylenol for pain, and give this a little bit more time to resolve.  I advised her to call back in the next few days if any new or worsening symptoms.  Otherwise hopefully the symptoms will resolve in the next few days.  I also advised to take precautions in the event this is COVID-19 to self quarantine until symptom-free for at least 72 hours.  I gave her the option of going for COVID-19 testing but she declines.  We also discussed other differential for back pain and symptoms in general.  She will call if any new or worsening next 48 hours.  I advised if significantly worse back or belly pain or high fever the next few days to call for potentially go to urgent care or emergency dept.  Joanne Johns was seen today for back pain.  Diagnoses and all orders for this visit:  Acute low back pain without sciatica, unspecified back pain laterality  Nonintractable headache, unspecified chronicity pattern, unspecified headache type  Chills  Cough  Viral syndrome

## 2018-09-04 NOTE — Telephone Encounter (Signed)
Call back Thursday morning to see how she is feeling compared to yesterday?  Any new symptoms, or any worse symptoms?  Any urinary symptoms?  Does she have any way to check her blood sugar at home?  Any increased thirst any increased urination any blurred vision?  Any fever today?

## 2018-09-05 NOTE — Telephone Encounter (Signed)
Patient notified

## 2018-09-05 NOTE — Telephone Encounter (Signed)
Patient states that she is a little better.   She has not taken her sugar and she is not having any other symptoms.   No urinary symptoms.

## 2018-09-05 NOTE — Telephone Encounter (Signed)
Continue to hydrate, rest, and use Tylenol for pain.  I hope she gradually improves over the weekend

## 2018-09-09 ENCOUNTER — Other Ambulatory Visit: Payer: Self-pay | Admitting: Medical

## 2018-09-09 MED ORDER — HYDROCODONE-HOMATROPINE 5-1.5 MG/5ML PO SYRP: 5.0000 mL | ORAL_SOLUTION | Freq: Three times a day (TID) | ORAL | 0 refills | Status: AC | PRN

## 2018-09-19 DIAGNOSIS — Z03818 Encounter for observation for suspected exposure to other biological agents ruled out: Secondary | ICD-10-CM | POA: Diagnosis not present

## 2018-09-19 DIAGNOSIS — Z20828 Contact with and (suspected) exposure to other viral communicable diseases: Secondary | ICD-10-CM | POA: Diagnosis not present

## 2018-09-19 DIAGNOSIS — Z7189 Other specified counseling: Secondary | ICD-10-CM | POA: Diagnosis not present

## 2018-09-19 DIAGNOSIS — U071 COVID-19: Secondary | ICD-10-CM | POA: Diagnosis not present

## 2018-11-16 ENCOUNTER — Other Ambulatory Visit: Payer: Self-pay | Admitting: Obstetrics & Gynecology

## 2018-11-18 NOTE — Telephone Encounter (Signed)
Medication refill request: Valtrex Last AEX:  02/20/2018 JJ Next AEX: 02/26/2019 Last MMG (if hormonal medication request): 04/17/2017 BIRADS 1 Negative Density B Refill authorized: #[id number] with 1 refills if appropriate. Please advise.

## 2019-02-19 DIAGNOSIS — B351 Tinea unguium: Secondary | ICD-10-CM | POA: Diagnosis not present

## 2019-02-19 DIAGNOSIS — B353 Tinea pedis: Secondary | ICD-10-CM | POA: Diagnosis not present

## 2019-02-20 DIAGNOSIS — Z79899 Other long term (current) drug therapy: Secondary | ICD-10-CM | POA: Diagnosis not present

## 2019-02-24 ENCOUNTER — Other Ambulatory Visit: Payer: Self-pay

## 2019-02-24 NOTE — Progress Notes (Signed)
43 y.o. [id number] Single Black or African American Not Hispanic or Latino female here for annual exam.  She has a mirena IUD, placed in 7/17. Just occasional, light spotting.  She is liking working from home, started with Covid.  She is with the same partner, not living together. Not using condoms. No dyspareunia.   She has lost 20 lbs in the last 9 months. She has been walking    No LMP recorded. (Menstrual status: IUD).          Sexually active: Yes.    The current method of family planning is IUD.    Exercising: Yes.    walking Smoker:  no  Health Maintenance: Pap:01/17/2017 NEG with NEG HR HPV, 12-25-14 WNL + HR HPV, - for HPV 16/18 History of abnormal Pap:Yes, she has had a colposcopy in the distant past, no surgery on her cervix. MMG:04/17/2018 Birads 1 negative TDaP:05-04-11 Gardasil:No   reports that she has never smoked. She has never used smokeless tobacco. She reports that she does not drink alcohol or use drugs. 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 Daughter is 52, 2 kids. Son is 7.   Past Medical History:  Diagnosis Date  . Abnormal Pap smear 2005   paps normal 2007, 2008, 2009, 2010, 2013, 2014  . Back pain   . Chlamydia 2016  . Herpes gingivostomatitis   . History of EKG 02/09/09   normal  . History of pregnancy    5 pregnancies, 2 live births, 3 TABs  . HSV infection 2018   positive HSV I and II  . Hypertension   . Impaired fasting blood sugar 2015  . Liver hemangioma 2008   per ultrasound and 02/2013 MRI abdomen  . Obesity     Past Surgical History:  Procedure Laterality Date  . COLPOSCOPY  05/2003  . INTRAUTERINE DEVICE (IUD) INSERTION  2017   Mirena  . KNEE SURGERY  2000   R knee, ACL, MCL meniscus    Current Outpatient Medications  Medication Sig Dispense Refill  . amLODipine (NORVASC) 10 MG tablet TAKE 1 TABLET BY MOUTH EVERY DAY 90 tablet 1  . levonorgestrel (MIRENA) 20 MCG/24HR IUD 1 each by  Intrauterine route once.    . terbinafine (LAMISIL) 250 MG tablet Take 250 mg by mouth daily.    . valACYclovir (VALTREX) 500 MG tablet TAKE 1 TABLET BY MOUTH TWICE A DAY FOR 3 DAYS AS NEEDED FOR OUTBREAK 30 tablet 1   No current facility-administered medications for this visit.     Family History  Problem Relation Age of Onset  . Cancer Maternal Uncle        leukemia  . Tuberculosis Father   . Diabetes Father   . Hypertension Father   . Heart attack Father   . Anesthesia problems Neg Hx   . Heart disease Neg Hx   . Hyperlipidemia Neg Hx   . Stroke Neg Hx     Review of Systems  Constitutional: Negative.   HENT: Negative.   Eyes: Negative.   Respiratory: Negative.   Cardiovascular: Negative.   Gastrointestinal: Negative.   Endocrine: Negative.   Genitourinary: Negative.   Musculoskeletal: Negative.   Skin: Negative.   Allergic/Immunologic: Negative.   Neurological: Negative.   Hematological: Negative.   Psychiatric/Behavioral: Negative.     Exam:   BP 138/88 (BP Location: Right Arm, Patient Position: Sitting, Cuff Size: Large)   Pulse 88   Temp (!) 97.3 F (36.3 C) (Skin)  Ht 5' 3.5" (1.613 m)   Wt 253 lb 12.8 oz (115.1 kg)   BMI 44.25 kg/m   Weight change: @WEIGHTCHANGE @ Height:   Height: 5' 3.5" (161.3 cm)  Ht Readings from Last 3 Encounters:  02/26/19 5' 3.5" (1.613 m)  09/04/18 5\' 4"  (1.626 m)  02/20/18 5' 3.58" (1.615 m)    General appearance: alert, cooperative and appears stated age Head: Normocephalic, without obvious abnormality, atraumatic Neck: no adenopathy, supple, symmetrical, trachea midline and thyroid normal to inspection and palpation Lungs: clear to auscultation bilaterally Cardiovascular: regular rate and rhythm Breasts: normal appearance, no masses or tenderness Abdomen: soft, non-tender; non distended,  no masses,  no organomegaly Extremities: extremities normal, atraumatic, no cyanosis or edema Skin: Skin color, texture, turgor  normal. No rashes or lesions Lymph nodes: Cervical, supraclavicular, and axillary nodes normal. No abnormal inguinal nodes palpated Neurologic: Grossly normal   Pelvic: External genitalia:  no lesions              Urethra:  normal appearing urethra with no masses, tenderness or lesions              Bartholins and Skenes: normal                 Vagina: normal appearing vagina with normal color and discharge, no lesions              Cervix: no lesions and IUD string 2 cm               Bimanual Exam:  Uterus:  normal size, contour, position, consistency, mobility, non-tender and anteverted              Adnexa: no mass, fullness, tenderness               Rectovaginal: Confirms               Anus:  normal sphincter tone, no lesions  Chaperone was present for exam.  A:  Well Woman with normal exam  BMI 44, working on weight loss, exercising, eating bettter  IUD check, doing well  P:   Pap with hpv  Discussed breast self exam  Discussed calcium and vit D intake  Mammogram in 1/21  Screening labs, TSH, HgbA1C  STD testing.

## 2019-02-26 ENCOUNTER — Ambulatory Visit (INDEPENDENT_AMBULATORY_CARE_PROVIDER_SITE_OTHER): Payer: BLUE CROSS/BLUE SHIELD | Admitting: Obstetrics and Gynecology

## 2019-02-26 ENCOUNTER — Other Ambulatory Visit: Payer: Self-pay

## 2019-02-26 ENCOUNTER — Other Ambulatory Visit (HOSPITAL_COMMUNITY)
Admission: RE | Admit: 2019-02-26 | Discharge: 2019-02-26 | Disposition: A | Discharge status: Home / Self Care | Payer: BLUE CROSS/BLUE SHIELD | Source: Ambulatory Visit | Attending: Obstetrics and Gynecology | Admitting: Obstetrics and Gynecology

## 2019-02-26 ENCOUNTER — Encounter: Payer: Self-pay | Admitting: Obstetrics and Gynecology

## 2019-02-26 VITALS — BP 138/88 | HR 88 | Temp 97.3°F | Ht 63.5 in | Wt 253.8 lb

## 2019-02-26 DIAGNOSIS — Z6841 Body Mass Index (BMI) 40.0 and over, adult: Secondary | ICD-10-CM

## 2019-02-26 DIAGNOSIS — Z124 Encounter for screening for malignant neoplasm of cervix: Secondary | ICD-10-CM | POA: Insufficient documentation

## 2019-02-26 DIAGNOSIS — Z0000 Encounter for general adult medical examination without abnormal findings: Secondary | ICD-10-CM

## 2019-02-26 DIAGNOSIS — Z01419 Encounter for gynecological examination (general) (routine) without abnormal findings: Secondary | ICD-10-CM

## 2019-02-26 DIAGNOSIS — Z30431 Encounter for routine checking of intrauterine contraceptive device: Secondary | ICD-10-CM

## 2019-02-26 DIAGNOSIS — Z113 Encounter for screening for infections with a predominantly sexual mode of transmission: Secondary | ICD-10-CM

## 2019-02-26 MED ORDER — VALACYCLOVIR HCL 500 MG PO TABS: ORAL_TABLET | ORAL | 1 refills | Status: DC

## 2019-02-26 NOTE — Patient Instructions (Signed)
EXERCISE AND DIET:  We recommended that you start or continue a regular exercise program for good health. Regular exercise means any activity that makes your heart beat faster and makes you sweat.  We recommend exercising at least 30 minutes per day at least 3 days a week, preferably 4 or 5.  We also recommend a diet low in fat and sugar.  Inactivity, poor dietary choices and obesity can cause diabetes, heart attack, stroke, and kidney damage, among others.    ALCOHOL AND SMOKING:  Women should limit their alcohol intake to no more than 7 drinks/beers/glasses of wine (combined, not each!) per week. Moderation of alcohol intake to this level decreases your risk of breast cancer and liver damage. And of course, no recreational drugs are part of a healthy lifestyle.  And absolutely no smoking or even second hand smoke. Most people know smoking can cause heart and lung diseases, but did you know it also contributes to weakening of your bones? Aging of your skin?  Yellowing of your teeth and nails?  CALCIUM AND VITAMIN D:  Adequate intake of calcium and Vitamin D are recommended.  The recommendations for exact amounts of these supplements seem to change often, but generally speaking 1,000 mg of calcium (between diet and supplement) and 800 units of Vitamin D per day seems prudent. Certain women may benefit from higher intake of Vitamin D.  If you are among these women, your doctor will have told you during your visit.    PAP SMEARS:  Pap smears, to check for cervical cancer or precancers,  have traditionally been done yearly, although recent scientific advances have shown that most women can have pap smears less often.  However, every woman still should have a physical exam from her gynecologist every year. It will include a breast check, inspection of the vulva and vagina to check for abnormal growths or skin changes, a visual exam of the cervix, and then an exam to evaluate the size and shape of the uterus and  ovaries.  And after 42 years of age, a rectal exam is indicated to check for rectal cancers. We will also provide age appropriate advice regarding health maintenance, like when you should have certain vaccines, screening for sexually transmitted diseases, bone density testing, colonoscopy, mammograms, etc.   MAMMOGRAMS:  All women over 40 years old should have a yearly mammogram. Many facilities now offer a "3D" mammogram, which may cost around $50 extra out of pocket. If possible,  we recommend you accept the option to have the 3D mammogram performed.  It both reduces the number of women who will be called back for extra views which then turn out to be normal, and it is better than the routine mammogram at detecting truly abnormal areas.    COLON CANCER SCREENING: Now recommend starting at age 45. At this time colonoscopy is not covered for routine screening until 50. There are take home tests that can be done between 45-49.   COLONOSCOPY:  Colonoscopy to screen for colon cancer is recommended for all women at age 50.  We know, you hate the idea of the prep.  We agree, BUT, having colon cancer and not knowing it is worse!!  Colon cancer so often starts as a polyp that can be seen and removed at colonscopy, which can quite literally save your life!  And if your first colonoscopy is normal and you have no family history of colon cancer, most women don't have to have it again for   10 years.  Once every ten years, you can do something that may end up saving your life, right?  We will be happy to help you get it scheduled when you are ready.  Be sure to check your insurance coverage so you understand how much it will cost.  It may be covered as a preventative service at no cost, but you should check your particular policy.      Breast Self-Awareness Breast self-awareness means being familiar with how your breasts look and feel. It involves checking your breasts regularly and reporting any changes to your  health care provider. Practicing breast self-awareness is important. A change in your breasts can be a sign of a serious medical problem. Being familiar with how your breasts look and feel allows you to find any problems early, when treatment is more likely to be successful. All women should practice breast self-awareness, including women who have had breast implants. How to do a breast self-exam One way to learn what is normal for your breasts and whether your breasts are changing is to do a breast self-exam. To do a breast self-exam: Look for Changes  1. Remove all the clothing above your waist. 2. Stand in front of a mirror in a room with good lighting. 3. Put your hands on your hips. 4. Push your hands firmly downward. 5. Compare your breasts in the mirror. Look for differences between them (asymmetry), such as: ? Differences in shape. ? Differences in size. ? Puckers, dips, and bumps in one breast and not the other. 6. Look at each breast for changes in your skin, such as: ? Redness. ? Scaly areas. 7. Look for changes in your nipples, such as: ? Discharge. ? Bleeding. ? Dimpling. ? Redness. ? A change in position. Feel for Changes Carefully feel your breasts for lumps and changes. It is best to do this while lying on your back on the floor and again while sitting or standing in the shower or tub with soapy water on your skin. Feel each breast in the following way:  Place the arm on the side of the breast you are examining above your head.  Feel your breast with the other hand.  Start in the nipple area and make  inch (2 cm) overlapping circles to feel your breast. Use the pads of your three middle fingers to do this. Apply light pressure, then medium pressure, then firm pressure. The light pressure will allow you to feel the tissue closest to the skin. The medium pressure will allow you to feel the tissue that is a little deeper. The firm pressure will allow you to feel the tissue  close to the ribs.  Continue the overlapping circles, moving downward over the breast until you feel your ribs below your breast.  Move one finger-width toward the center of the body. Continue to use the  inch (2 cm) overlapping circles to feel your breast as you move slowly up toward your collarbone.  Continue the up and down exam using all three pressures until you reach your armpit.  Write Down What You Find  Write down what is normal for each breast and any changes that you find. Keep a written record with breast changes or normal findings for each breast. By writing this information down, you do not need to depend only on memory for size, tenderness, or location. Write down where you are in your menstrual cycle, if you are still menstruating. If you are having trouble noticing differences   in your breasts, do not get discouraged. With time you will become more familiar with the variations in your breasts and more comfortable with the exam. How often should I examine my breasts? Examine your breasts every month. If you are breastfeeding, the best time to examine your breasts is after a feeding or after using a breast pump. If you menstruate, the best time to examine your breasts is 5-7 days after your period is over. During your period, your breasts are lumpier, and it may be more difficult to notice changes. When should I see my health care provider? See your health care provider if you notice:  A change in shape or size of your breasts or nipples.  A change in the skin of your breast or nipples, such as a reddened or scaly area.  Unusual discharge from your nipples.  A lump or thick area that was not there before.  Pain in your breasts.  Anything that concerns you.  

## 2019-02-27 LAB — CBC
Hematocrit: 40.7 % (ref 34.0–46.6)
Hemoglobin: 13.1 g/dL (ref 11.1–15.9)
MCH: 29.3 pg (ref 26.6–33.0)
MCHC: 32.2 g/dL (ref 31.5–35.7)
MCV: 91 fL (ref 79–97)
Platelets: 298 10*3/uL (ref 150–450)
RBC: 4.47 x10E6/uL (ref 3.77–5.28)
RDW: 12.6 % (ref 11.7–15.4)
WBC: 7.4 10*3/uL (ref 3.4–10.8)

## 2019-02-27 LAB — COMPREHENSIVE METABOLIC PANEL
ALT: 8 IU/L (ref 0–32)
AST: 13 IU/L (ref 0–40)
Albumin/Globulin Ratio: 1.9 (ref 1.2–2.2)
Albumin: 4.3 g/dL (ref 3.8–4.8)
Alkaline Phosphatase: 69 IU/L (ref 39–117)
BUN/Creatinine Ratio: 10 (ref 9–23)
BUN: 11 mg/dL (ref 6–24)
Bilirubin Total: 0.4 mg/dL (ref 0.0–1.2)
CO2: 21 mmol/L (ref 20–29)
Calcium: 9.3 mg/dL (ref 8.7–10.2)
Chloride: 103 mmol/L (ref 96–106)
Creatinine, Ser: 1.06 mg/dL — ABNORMAL HIGH (ref 0.57–1.00)
GFR calc Af Amer: 75 mL/min/{1.73_m2} (ref >59)
GFR calc non Af Amer: 65 mL/min/{1.73_m2} (ref >59)
Globulin, Total: 2.3 g/dL (ref 1.5–4.5)
Glucose: 81 mg/dL (ref 65–99)
Potassium: 4.4 mmol/L (ref 3.5–5.2)
Sodium: 139 mmol/L (ref 134–144)
Total Protein: 6.6 g/dL (ref 6.0–8.5)

## 2019-02-27 LAB — LIPID PANEL
Chol/HDL Ratio: 2.2 ratio (ref 0.0–4.4)
Cholesterol, Total: 196 mg/dL (ref 100–199)
HDL: 90 mg/dL (ref >39)
LDL Chol Calc (NIH): 96 mg/dL (ref 0–99)
Triglycerides: 55 mg/dL (ref 0–149)
VLDL Cholesterol Cal: 10 mg/dL (ref 5–40)

## 2019-02-27 LAB — HEMOGLOBIN A1C
Est. average glucose Bld gHb Est-mCnc: 108 mg/dL
Hgb A1c MFr Bld: 5.4 % (ref 4.8–5.6)

## 2019-02-27 LAB — TSH: TSH: 2.09 u[IU]/mL (ref 0.450–4.500)

## 2019-02-27 LAB — RPR: RPR Ser Ql: NONREACTIVE

## 2019-02-27 LAB — HIV ANTIBODY (ROUTINE TESTING W REFLEX): HIV Screen 4th Generation wRfx: NONREACTIVE

## 2019-02-28 LAB — CYTOLOGY - PAP
Chlamydia: NEGATIVE
Comment: NEGATIVE
Comment: NEGATIVE
Comment: NORMAL
Diagnosis: NEGATIVE
High risk HPV: NEGATIVE
Neisseria Gonorrhea: NEGATIVE

## 2019-03-12 ENCOUNTER — Other Ambulatory Visit: Payer: Self-pay | Admitting: Obstetrics and Gynecology

## 2019-03-17 NOTE — Telephone Encounter (Signed)
Spoke with patient. She did not request 90 RX for Valtrex. Rx was filled recently and patient knows she should only take as needed for outbreak. Request for #180 tablets, 90 day supply refused and patient aware.

## 2019-03-24 DIAGNOSIS — Z79899 Other long term (current) drug therapy: Secondary | ICD-10-CM | POA: Diagnosis not present

## 2019-03-26 DIAGNOSIS — B353 Tinea pedis: Secondary | ICD-10-CM | POA: Diagnosis not present

## 2019-03-26 DIAGNOSIS — B351 Tinea unguium: Secondary | ICD-10-CM | POA: Diagnosis not present

## 2019-04-18 ENCOUNTER — Other Ambulatory Visit: Payer: Self-pay

## 2019-04-18 MED ORDER — AMLODIPINE BESYLATE 10 MG PO TABS: 10.0000 mg | ORAL_TABLET | Freq: Every day | ORAL | 0 refills | Status: DC

## 2019-05-10 ENCOUNTER — Other Ambulatory Visit: Payer: Self-pay | Admitting: Medical

## 2019-06-06 ENCOUNTER — Encounter: Payer: Self-pay | Admitting: Certified Nurse Midwife

## 2019-09-18 ENCOUNTER — Other Ambulatory Visit: Payer: Self-pay | Admitting: Obstetrics and Gynecology

## 2019-09-18 NOTE — Telephone Encounter (Signed)
Medication refill request: valtrex 500mg  Last AEX:  02-26-2019 Next AEX: 03-03-2020 Last MMG (if hormonal medication request): n/a Refill authorized: please approve if appropriate

## 2019-10-02 ENCOUNTER — Other Ambulatory Visit: Payer: Self-pay | Admitting: Obstetrics and Gynecology

## 2019-10-02 NOTE — Telephone Encounter (Signed)
Medication refill request: Valtrex Last AEX:  03-18-2019 JJ  Next AEX: 03-03-20 Last MMG (if hormonal medication request): n/a Refill authorized: Today, please advise.   Pharmacy requesting 90 day supply. Please advise.

## 2019-10-02 NOTE — Telephone Encounter (Signed)
The refill request isn't appropriate. She isn't taking it BID every day, only with an outbreak. Please have them fill the original script if not already done.

## 2019-10-25 ENCOUNTER — Other Ambulatory Visit: Payer: Self-pay | Admitting: Medical

## 2019-11-05 ENCOUNTER — Other Ambulatory Visit: Payer: Self-pay | Admitting: Medical

## 2019-11-05 NOTE — Telephone Encounter (Signed)
Give 30 day supply when she calls to make CPX fasting

## 2019-11-12 NOTE — Telephone Encounter (Signed)
Patient still has not made her appointment.

## 2020-01-07 ENCOUNTER — Other Ambulatory Visit: Payer: Self-pay | Admitting: Obstetrics and Gynecology

## 2020-01-07 DIAGNOSIS — A6009 Herpesviral infection of other urogenital tract: Secondary | ICD-10-CM

## 2020-01-07 NOTE — Telephone Encounter (Signed)
Medication refill request: Valcyclovir 500mg   Last AEX:  02/26/19 Next AEX: 03/03/20 Last MMG (if hormonal medication request): 1//29/20  Neg  Refill authorized: 30/1

## 2020-01-27 ENCOUNTER — Other Ambulatory Visit: Payer: Self-pay | Admitting: Obstetrics and Gynecology

## 2020-01-27 DIAGNOSIS — A6009 Herpesviral infection of other urogenital tract: Secondary | ICD-10-CM

## 2020-02-03 IMAGING — MG DIGITAL SCREENING BILATERAL MAMMOGRAM WITH TOMO AND CAD
6 of 10 series · 6 of 30 positions shown · non-contrast
Comparison: None.

CLINICAL DATA: Screening.

EXAM:
DIGITAL SCREENING BILATERAL MAMMOGRAM WITH TOMO AND CAD

[L CC synth-2D]
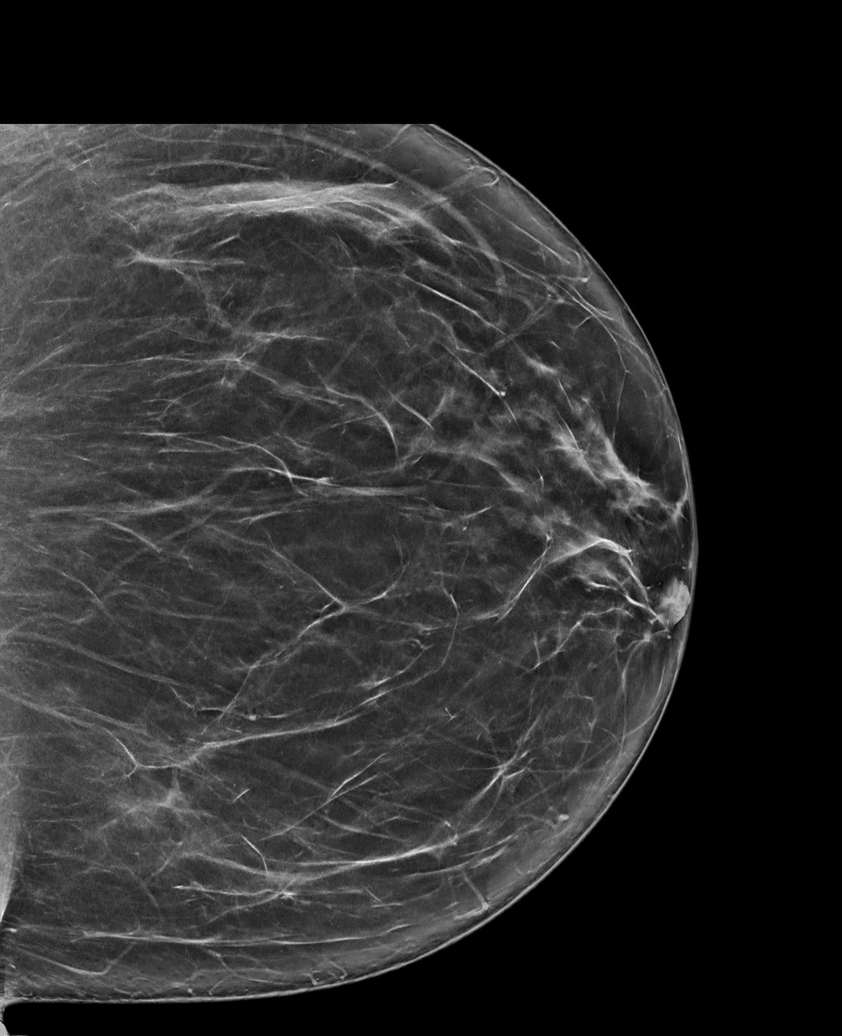

[R MLO synth-2D (1 of 2)]
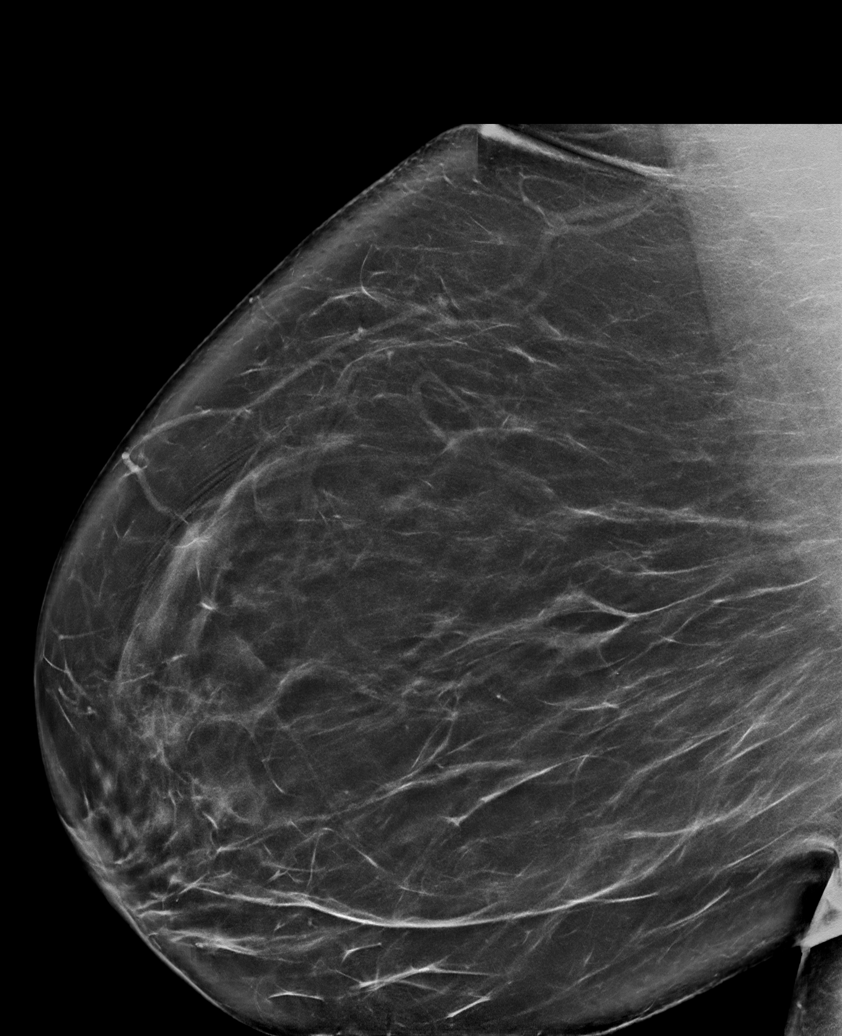

[R CC synth-2D]
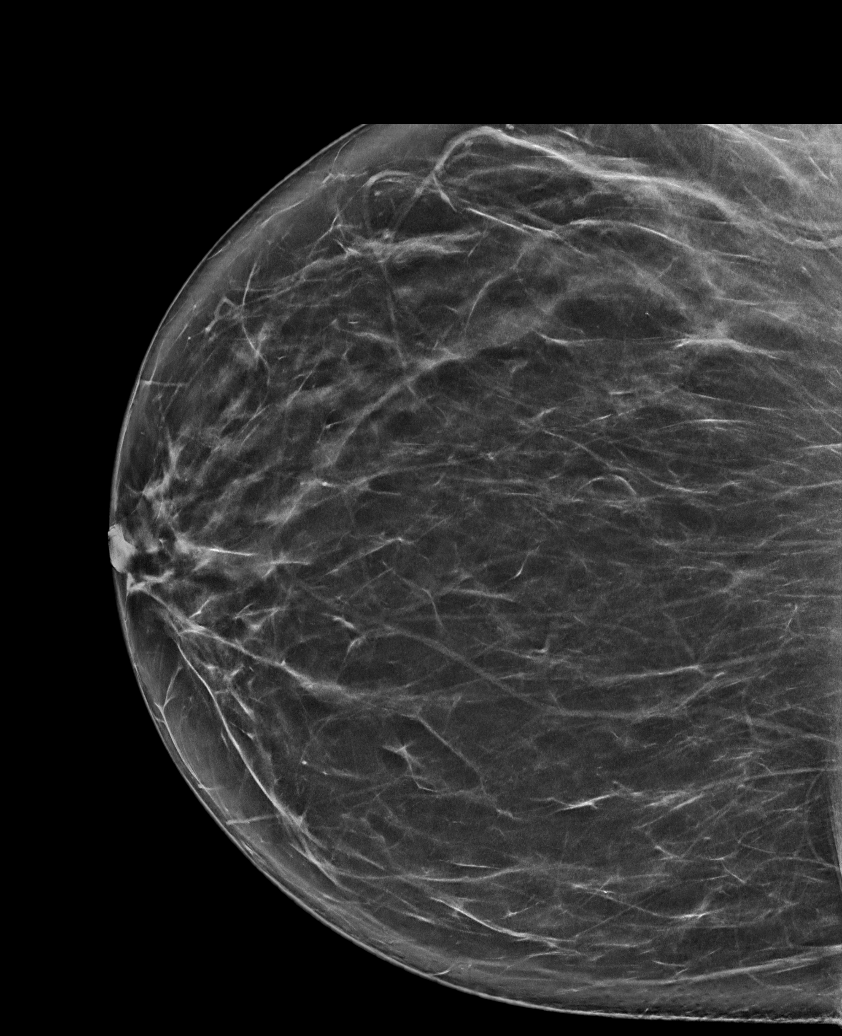

[L MLO synth-2D]
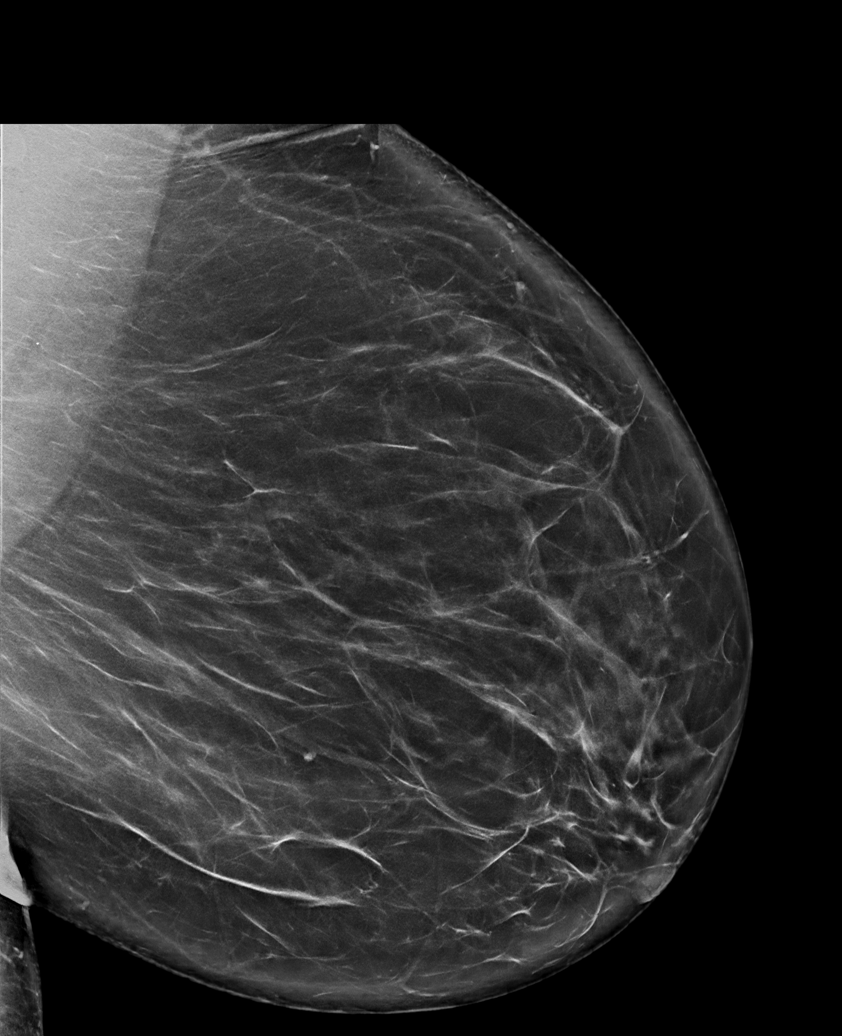

[R MLO synth-2D (2 of 2)]
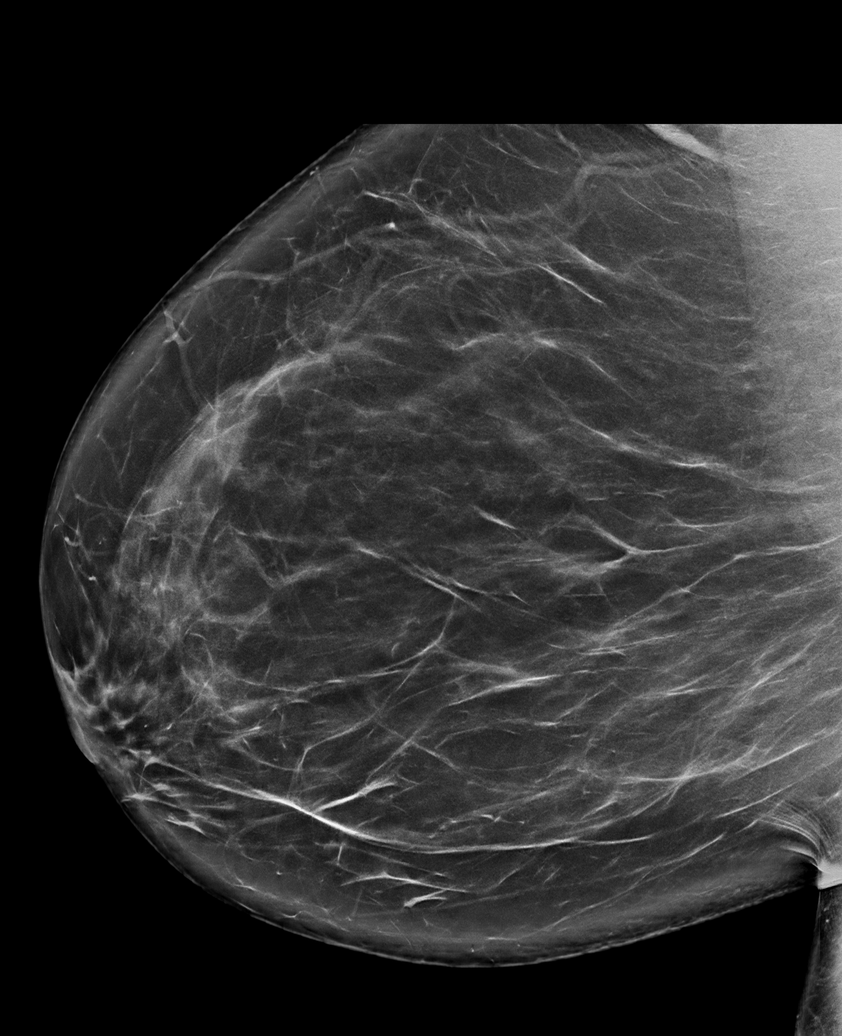

[L MLO tomo · tomo slice 55/108.0]
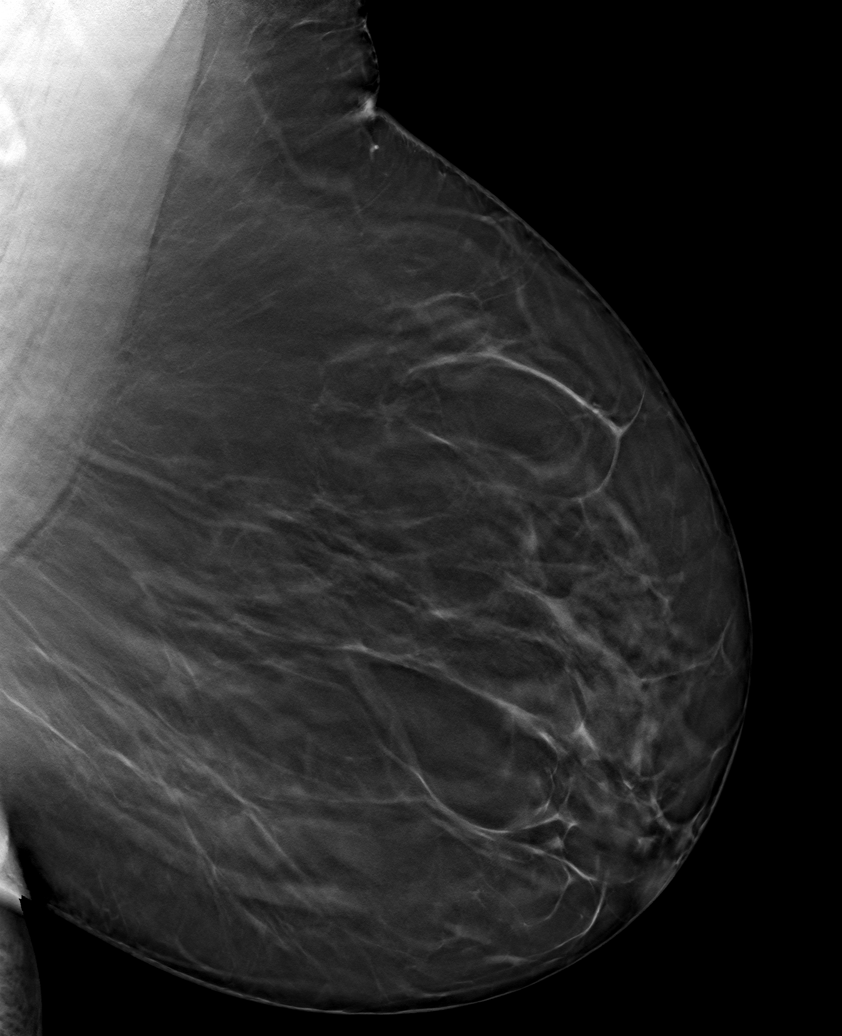

[6 of 30 positions shown; findings below may reference images not displayed]

ACR Breast Density Category b: There are scattered areas of
fibroglandular density.
FINDINGS: There are no findings suspicious for malignancy. Images were
processed with CAD.
IMPRESSION: No mammographic evidence of malignancy. A result letter of this
screening mammogram will be mailed directly to the patient.

RECOMMENDATION:
Screening mammogram in one year. (Code:Y5-G-EJ6)

BI-RADS CATEGORY  1: Negative.

## 2020-02-17 ENCOUNTER — Other Ambulatory Visit: Payer: Self-pay | Admitting: Obstetrics and Gynecology

## 2020-02-17 DIAGNOSIS — A6009 Herpesviral infection of other urogenital tract: Secondary | ICD-10-CM

## 2020-03-02 NOTE — Progress Notes (Signed)
44 y.o. [id number] Single Black or African American Not Hispanic or Latino female here for annual exam.  She has a mirena IUD, placed in 7/17. Occasional spotting, random.  Sexually active, same partner since August. Not using condoms. No dyspareunia.  She has noticed a slight odor after intercourse. No increase in vaginal discharge.   H/O HSV, gets an outbreak ~q3 months.   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    No LMP recorded. (Menstrual status: IUD).          Sexually active: Yes.    The current method of family planning is IUD.    Exercising: No.  The patient does not participate in regular exercise at present. Smoker:  no  Health Maintenance: Pap: 02/26/19 WNL Hr HPV Neg,  01/17/2017 NEG with NEG HR HPV,12-25-14 WNL + HR HPV, - for HPV 16/18 History of abnormal Pap:  Yes Colpo in the past  MMG:  04/17/18 Density B Birads 1 Neg  BMD:   None  Colonoscopy: none  TDaP:  05/04/11  Gardasil: none    reports that she has never smoked. She has never used smokeless tobacco. She reports that she does not drink alcohol and does not use drugs. Works for an [employer] as an [employer].Daughter is 24, 3 kids (4, 2, 6 months). Lives in the same apartment complex. Son is 8  Past Medical History:  Diagnosis Date  . Abnormal Pap smear 2005   paps normal 2007, 2008, 2009, 2010, 2013, 2014  . Back pain   . Chlamydia 2016  . Herpes gingivostomatitis   . History of EKG 02/09/09   normal  . History of pregnancy    5 pregnancies, 2 live births, 3 TABs  . HSV infection 2018   positive HSV I and II  . Hypertension   . Impaired fasting blood sugar 2015  . Liver hemangioma 2008   per ultrasound and 02/2013 MRI abdomen  . Obesity     Past Surgical History:  Procedure Laterality Date  . COLPOSCOPY  05/2003  . INTRAUTERINE DEVICE (IUD) INSERTION  2017   Mirena  . KNEE SURGERY  2000   R knee, ACL, MCL meniscus    Current Outpatient Medications   Medication Sig Dispense Refill  . amLODipine (NORVASC) 10 MG tablet TAKE 1 TABLET BY MOUTH EVERY DAY 30 tablet 0  . levonorgestrel (MIRENA) 20 MCG/24HR IUD 1 each by Intrauterine route once.    . valACYclovir (VALTREX) 500 MG tablet TAKE 1 TABLET BY MOUTH TWICE A DAY FOR 3 DAYS AS NEEDED FOR OUTBREAK 30 tablet 1   No current facility-administered medications for this visit.    Family History  Problem Relation Age of Onset  . Cancer Maternal Uncle        leukemia  . Tuberculosis Father   . Diabetes Father   . Hypertension Father   . Heart attack Father   . Anesthesia problems Neg Hx   . Heart disease Neg Hx   . Hyperlipidemia Neg Hx   . Stroke Neg Hx     Review of Systems  All other systems reviewed and are negative.   Exam:   BP 130/76   Pulse 87   Ht 5\' 4"  (1.626 m)   Wt 275 lb (124.7 kg)   SpO2 100%   BMI 47.20 kg/m   Weight change: @WEIGHTCHANGE @ Height:   Height: 5\' 4"  (162.6 cm)  Ht Readings from Last 3 Encounters:  03/03/20 5\' 4"  (1.626  m)  02/26/19 5' 3.5" (1.613 m)  09/04/18 5\' 4"  (1.626 m)    General appearance: alert, cooperative and appears stated age Head: Normocephalic, without obvious abnormality, atraumatic Neck: no adenopathy, supple, symmetrical, trachea midline and thyroid normal to inspection and palpation Lungs: clear to auscultation bilaterally Cardiovascular: regular rate and rhythm Breasts: normal appearance, no masses or tenderness Abdomen: soft, non-tender; non distended,  no masses,  no organomegaly Extremities: extremities normal, atraumatic, no cyanosis or edema Skin: Skin color, texture, turgor normal. No rashes or lesions Lymph nodes: Cervical, supraclavicular, and axillary nodes normal. No abnormal inguinal nodes palpated Neurologic: Grossly normal   Pelvic: External genitalia:  no lesions              Urethra:  normal appearing urethra with no masses, tenderness or lesions              Bartholins and Skenes: normal                  Vagina: normal appearing vagina with normal color and discharge, no lesions              Cervix: no lesions and IUD string not seen               Bimanual Exam:  Uterus:  no masses or tenderness              Adnexa: no mass, fullness, tenderness               Rectovaginal: Confirms               Anus:  normal sphincter tone, no lesions  Karmen Bongo chaperoned for the exam.  A:  Well Woman with normal exam  H/O prediabetes, normal last year   BMI 47, up from 14  H/O HSV, just got some valtrex (she will call for refill if needed)  Vaginal odor  IUD string missing, previously in place on ultrasound      P:   Pap due in 12/23  Mammogram due, she will schedule  Discussed breast self exam  Discussed calcium and vit D intake  Screening labs  Nuswab for BV  STD testing  Condom use encouraged  # given for weight loss clinic

## 2020-03-03 ENCOUNTER — Ambulatory Visit (INDEPENDENT_AMBULATORY_CARE_PROVIDER_SITE_OTHER): Payer: BLUE CROSS/BLUE SHIELD | Admitting: Obstetrics and Gynecology

## 2020-03-03 ENCOUNTER — Encounter: Payer: Self-pay | Admitting: Obstetrics and Gynecology

## 2020-03-03 ENCOUNTER — Other Ambulatory Visit: Payer: Self-pay

## 2020-03-03 VITALS — BP 130/76 | HR 87 | Ht 64.0 in | Wt 275.0 lb

## 2020-03-03 DIAGNOSIS — Z6841 Body Mass Index (BMI) 40.0 and over, adult: Secondary | ICD-10-CM | POA: Diagnosis not present

## 2020-03-03 DIAGNOSIS — N898 Other specified noninflammatory disorders of vagina: Secondary | ICD-10-CM

## 2020-03-03 DIAGNOSIS — Z87898 Personal history of other specified conditions: Secondary | ICD-10-CM

## 2020-03-03 DIAGNOSIS — Z01419 Encounter for gynecological examination (general) (routine) without abnormal findings: Secondary | ICD-10-CM

## 2020-03-03 DIAGNOSIS — Z0000 Encounter for general adult medical examination without abnormal findings: Secondary | ICD-10-CM | POA: Diagnosis not present

## 2020-03-03 DIAGNOSIS — Z30431 Encounter for routine checking of intrauterine contraceptive device: Secondary | ICD-10-CM

## 2020-03-03 DIAGNOSIS — Z7185 Encounter for immunization safety counseling: Secondary | ICD-10-CM | POA: Diagnosis not present

## 2020-03-03 DIAGNOSIS — Z23 Encounter for immunization: Secondary | ICD-10-CM

## 2020-03-03 DIAGNOSIS — Z113 Encounter for screening for infections with a predominantly sexual mode of transmission: Secondary | ICD-10-CM | POA: Diagnosis not present

## 2020-03-03 NOTE — Patient Instructions (Signed)

## 2020-03-04 LAB — RPR: RPR Ser Ql: NONREACTIVE

## 2020-03-04 LAB — COMPREHENSIVE METABOLIC PANEL
ALT: 10 IU/L (ref 0–32)
AST: 10 IU/L (ref 0–40)
Albumin/Globulin Ratio: 2.1 (ref 1.2–2.2)
Albumin: 4.2 g/dL (ref 3.8–4.8)
Alkaline Phosphatase: 68 IU/L (ref 44–121)
BUN/Creatinine Ratio: 12 (ref 9–23)
BUN: 12 mg/dL (ref 6–24)
Bilirubin Total: 0.3 mg/dL (ref 0.0–1.2)
CO2: 20 mmol/L (ref 20–29)
Calcium: 9.2 mg/dL (ref 8.7–10.2)
Chloride: 102 mmol/L (ref 96–106)
Creatinine, Ser: 1.01 mg/dL — ABNORMAL HIGH (ref 0.57–1.00)
GFR calc Af Amer: 79 mL/min/{1.73_m2} (ref >59)
GFR calc non Af Amer: 68 mL/min/{1.73_m2} (ref >59)
Globulin, Total: 2 g/dL (ref 1.5–4.5)
Glucose: 77 mg/dL (ref 65–99)
Potassium: 4.3 mmol/L (ref 3.5–5.2)
Sodium: 140 mmol/L (ref 134–144)
Total Protein: 6.2 g/dL (ref 6.0–8.5)

## 2020-03-04 LAB — LIPID PANEL
Chol/HDL Ratio: 2.3 ratio (ref 0.0–4.4)
Cholesterol, Total: 195 mg/dL (ref 100–199)
HDL: 84 mg/dL (ref >39)
LDL Chol Calc (NIH): 100 mg/dL — ABNORMAL HIGH (ref 0–99)
Triglycerides: 57 mg/dL (ref 0–149)
VLDL Cholesterol Cal: 11 mg/dL (ref 5–40)

## 2020-03-04 LAB — CBC
Hematocrit: 42.5 % (ref 34.0–46.6)
Hemoglobin: 14.3 g/dL (ref 11.1–15.9)
MCH: 30.6 pg (ref 26.6–33.0)
MCHC: 33.6 g/dL (ref 31.5–35.7)
MCV: 91 fL (ref 79–97)
Platelets: 315 10*3/uL (ref 150–450)
RBC: 4.67 x10E6/uL (ref 3.77–5.28)
RDW: 12.9 % (ref 11.7–15.4)
WBC: 7.6 10*3/uL (ref 3.4–10.8)

## 2020-03-04 LAB — HEMOGLOBIN A1C
Est. average glucose Bld gHb Est-mCnc: 114 mg/dL
Hgb A1c MFr Bld: 5.6 % (ref 4.8–5.6)

## 2020-03-04 LAB — HIV ANTIBODY (ROUTINE TESTING W REFLEX): HIV Screen 4th Generation wRfx: NONREACTIVE

## 2020-03-04 LAB — TSH: TSH: 1.62 u[IU]/mL (ref 0.450–4.500)

## 2020-03-07 LAB — NUSWAB VAGINITIS PLUS (VG+)
Candida albicans, NAA: NEGATIVE
Candida glabrata, NAA: NEGATIVE
Chlamydia trachomatis, NAA: NEGATIVE
Neisseria gonorrhoeae, NAA: NEGATIVE
Trich vag by NAA: NEGATIVE

## 2020-04-19 ENCOUNTER — Encounter: Payer: Self-pay | Admitting: Obstetrics and Gynecology

## 2020-04-20 NOTE — Telephone Encounter (Signed)
I am not sure what the discuss was at the office visit regarding vaginal odor only, I do not see in office note that a Rx would be sent if this should continue.

## 2020-05-06 ENCOUNTER — Ambulatory Visit: Payer: BLUE CROSS/BLUE SHIELD

## 2020-05-06 NOTE — Progress Notes (Deleted)
Patient in today for 2nd Gardasil injection.   Contraception: IUD LMP: *** Last AEX: 03/03/20 with JJ  Injection given in RD. Patient tolerated shot well.   Patient informed next injection due in about 4 months.  Advised patient, if not on birth control, to return for next injection with cycle.   Routed to provider for final review.  Encounter closed.

## 2021-02-19 ENCOUNTER — Encounter: Payer: Self-pay | Admitting: Obstetrics and Gynecology

## 2021-02-19 DIAGNOSIS — A6009 Herpesviral infection of other urogenital tract: Secondary | ICD-10-CM

## 2021-02-21 MED ORDER — VALACYCLOVIR HCL 500 MG PO TABS: ORAL_TABLET | ORAL | 1 refills | Status: DC

## 2021-02-21 NOTE — Telephone Encounter (Signed)
Annual exam scheduled on 03/24/20

## 2021-03-10 ENCOUNTER — Other Ambulatory Visit: Payer: Self-pay

## 2021-03-10 ENCOUNTER — Telehealth (INDEPENDENT_AMBULATORY_CARE_PROVIDER_SITE_OTHER): Payer: No Typology Code available for payment source | Admitting: Medical

## 2021-03-10 ENCOUNTER — Other Ambulatory Visit (INDEPENDENT_AMBULATORY_CARE_PROVIDER_SITE_OTHER): Payer: No Typology Code available for payment source

## 2021-03-10 VITALS — Wt 283.0 lb

## 2021-03-10 DIAGNOSIS — R509 Fever, unspecified: Secondary | ICD-10-CM

## 2021-03-10 DIAGNOSIS — J029 Acute pharyngitis, unspecified: Secondary | ICD-10-CM | POA: Diagnosis not present

## 2021-03-10 LAB — POCT RAPID STREP A (OFFICE): Rapid Strep A Screen: NEGATIVE

## 2021-03-10 LAB — POC COVID19 BINAXNOW: SARS Coronavirus 2 Ag: NEGATIVE

## 2021-03-10 NOTE — Progress Notes (Signed)
Subjective:     Patient ID: Joanne Johns, female   DOB: 12-31-1975, 45 y.o.   MRN: [id number]  This visit type was conducted due to national recommendations for restrictions regarding the COVID-19 Pandemic (e.g. social distancing) in an effort to limit this patient's exposure and mitigate transmission in our community.  Due to their co-morbid illnesses, this patient is at least at moderate risk for complications without adequate follow up.  This format is felt to be most appropriate for this patient at this time.    Documentation for virtual audio and video telecommunications through [facility] encounter:  The patient was located at home. The provider was located in the office. The patient did consent to this visit and is aware of possible charges through their insurance for this visit.  The other persons participating in this telemedicine service were none. Time spent on call was 20 minutes and in review of previous records 20 minutes total.  This virtual service is not related to other E/M service within previous 7 days.   HPI Chief Complaint  Patient presents with   Sore Throat    Sore throat, ears popping and neck soreness.    She notes 3 day history of sore throat, ears popping, sore in front of neck, hurts to swallow.   No cough.  No runny nose or sneezing.  Felt low grade fever yesterday.  Has had some chills, no body aches.  No NVD.  No rash.  Has some headache, no sinus pressure.  Using some Nyquil as this helps in past with sore throat.  Has used some lozenge.  No recent home covid tests.  Son was sick last week. He has similar symptoms.  No specific strep, covid or flu positive contacts.   She does note though that her daughter had strep about 3 weeks ago.   No other aggravating or relieving factors. No other complaint.  Past Medical History:  Diagnosis Date   Abnormal Pap smear 2005   paps normal 2007, 2008, 2009, 2010, 2013, 2014   Back pain    Chlamydia 2016   Herpes  gingivostomatitis    History of EKG 02/09/09   normal   History of pregnancy    5 pregnancies, 2 live births, 3 TABs   HSV infection 2018   positive HSV I and II   Hypertension    Impaired fasting blood sugar 2015   Liver hemangioma 2008   per ultrasound and 02/2013 MRI abdomen   Obesity    Current Outpatient Medications on File Prior to Visit  Medication Sig Dispense Refill   amLODipine (NORVASC) 10 MG tablet TAKE 1 TABLET BY MOUTH EVERY DAY 30 tablet 0   levonorgestrel (MIRENA) 20 MCG/24HR IUD 1 each by Intrauterine route once.     valACYclovir (VALTREX) 500 MG tablet TAKE 1 TABLET BY MOUTH TWICE A DAY FOR 3 DAYS AS NEEDED FOR OUTBREAK 30 tablet 1   No current facility-administered medications on file prior to visit.     Review of Systems As in subjective    Objective:   Physical Exam Due to coronavirus pandemic stay at home measures, patient visit was virtual and they were not examined in person.   Wt 283 lb (128.4 kg)    BMI 48.58 kg/m   General: Well-developed well-nourished no acute distress mildly ill-appearing She seems to be tender of her anterior neck No coughing no congested sounding      Assessment:     Encounter Diagnoses  Name Primary?  Sore throat Yes   Subjective fever        Plan:     We discussed symptoms and concerns and possible differential.  She will come into our back parking lot this morning for testing as below.  Advise hydration, ibuprofen over-the-counter twice daily for the next few days, salt water gargles, hot tea or hot liquids to soothe the throat.  We discussed that if positive for strep we would add antibiotic.  If positive for COVID we would recommend quarantine measures and other supportive measures.  If negative for both test and we will assume viral pharyngitis and discussed usual timeframe for symptoms to resolve, supportive measures.  Addendum: Her strep and COVID test were negative today.  We will treat as viral  pharyngitis.  We discussed supportive measures.  Call or recheck if not much improved over the next 3 to 5 days  Wyoma was seen today for sore throat.  Diagnoses and all orders for this visit:  Sore throat  Subjective fever  F/u in our back parking lot this morning for COVID and strep testing

## 2021-03-11 LAB — SARS-COV-2, NAA 2 DAY TAT

## 2021-03-11 LAB — NOVEL CORONAVIRUS, NAA: SARS-CoV-2, NAA: NOT DETECTED

## 2021-03-15 ENCOUNTER — Other Ambulatory Visit: Payer: Self-pay | Admitting: Medical

## 2021-03-15 MED ORDER — AMOXICILLIN 875 MG PO TABS: 875.0000 mg | ORAL_TABLET | Freq: Three times a day (TID) | ORAL | 0 refills | Status: DC

## 2021-03-22 NOTE — Progress Notes (Signed)
46 y.o. [id number] Single Black or African American Not Hispanic or Latino female here for annual exam. Patient is concerned about her weight.    She has a mirena IUD, placed in 7/17. No regular menses. She has some BTB, light. Usually after sex.   Sexually active, new partner since August. Not using condoms. No dyspareunia.   H/O HSV, occasional outbreaks. Takes valtrex prn. Will call when she needs a refill.     No LMP recorded. (Menstrual status: IUD).          Sexually active: Yes.    The current method of family planning is IUD.   Mirena IUD placed 09/2015  Exercising: No.  The patient does not participate in regular exercise at present. Smoker:  no  Health Maintenance: Pap:   02/26/19 WNL Hr HPV Neg,  01/17/2017 NEG with NEG HR HPV, 12-25-14 WNL + HR HPV, - for HPV 16/18 History of abnormal Pap:  Yes Colpo in the past  MMG:  04/17/18 density B Bi-rads 1 neg  BMD:   none  Colonoscopy: none  TDaP:  05/04/11  Gardasil: none    reports that she has never smoked. She has never used smokeless tobacco. She reports that she does not drink alcohol and does not use drugs. 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 Daughter is 89, 3 kids (24, 44, & 10.46 years old). She just bought her first house. Son is 9.  Past Medical History:  Diagnosis Date   Abnormal Pap smear 2005   paps normal 2007, 2008, 2009, 2010, 2013, 2014   Back pain    Chlamydia 2016   Herpes gingivostomatitis    History of EKG 02/09/09   normal   History of pregnancy    5 pregnancies, 2 live births, 3 TABs   HSV infection 2018   positive HSV I and II   Hypertension    Impaired fasting blood sugar 2015   Liver hemangioma 2008   per ultrasound and 02/2013 MRI abdomen   Obesity     Past Surgical History:  Procedure Laterality Date   COLPOSCOPY  05/2003   INTRAUTERINE DEVICE (IUD) INSERTION  2017   Mirena   KNEE SURGERY  2000   R knee, ACL, MCL meniscus    Current Outpatient Medications   Medication Sig Dispense Refill   amLODipine (NORVASC) 10 MG tablet TAKE 1 TABLET BY MOUTH EVERY DAY 30 tablet 0   levonorgestrel (MIRENA) 20 MCG/24HR IUD 1 each by Intrauterine route once.     valACYclovir (VALTREX) 500 MG tablet TAKE 1 TABLET BY MOUTH TWICE A DAY FOR 3 DAYS AS NEEDED FOR OUTBREAK 30 tablet 1   No current facility-administered medications for this visit.    Family History  Problem Relation Age of Onset   Cancer Maternal Uncle        leukemia   Tuberculosis Father    Diabetes Father    Hypertension Father    Heart attack Father    Anesthesia problems Neg Hx    Heart disease Neg Hx    Hyperlipidemia Neg Hx    Stroke Neg Hx     Review of Systems  All other systems reviewed and are negative.  Exam:   BP 126/74    Pulse 78    Ht 5\' 4"  (1.626 m)    Wt 287 lb (130.2 kg)    SpO2 99%    BMI 49.26 kg/m   Weight change: @WEIGHTCHANGE @ Height:   Height: 5\' 4"  (  162.6 cm)  Ht Readings from Last 3 Encounters:  03/24/21 5\' 4"  (1.626 m)  03/03/20 5\' 4"  (1.626 m)  02/26/19 5' 3.5" (1.613 m)    General appearance: alert, cooperative and appears stated age Head: Normocephalic, without obvious abnormality, atraumatic Neck: no adenopathy, supple, symmetrical, trachea midline and thyroid normal to inspection and palpation Lungs: clear to auscultation bilaterally Cardiovascular: regular rate and rhythm Breasts: normal appearance, no masses or tenderness Abdomen: soft, non-tender; non distended,  no masses,  no organomegaly Extremities: extremities normal, atraumatic, no cyanosis or edema Skin: Skin color, texture, turgor normal. No rashes or lesions Lymph nodes: Cervical, supraclavicular, and axillary nodes normal. No abnormal inguinal nodes palpated Neurologic: Grossly normal   Pelvic: External genitalia:  no lesions              Urethra:  normal appearing urethra with no masses, tenderness or lesions              Bartholins and Skenes: normal                 Vagina:  normal appearing vagina with normal color and discharge, no lesions              Cervix: no lesions and IUD string not seen               Bimanual Exam:  Uterus:  normal size, contour, position, consistency, mobility, non-tender and anteverted              Adnexa: no mass, fullness, tenderness               Rectovaginal: Confirms               Anus:  normal sphincter tone, no lesions  Gae Dry chaperoned for the exam.   1. Well woman exam Discussed breast self exam Discussed calcium and vit D intake Mammogram overdue, # given  2. IUD check up Doing well, string missing, previously in place on ultrasound.   3. BMI 45.0-49.9, adult ([place]) Given # for healthy weight and wellness - Hemoglobin A1c - TSH - Lipid panel  4. Colon cancer screening Discussed options Will place referral to GI for colonoscopy after she turns 46  5. Laboratory exam ordered as part of routine general medical examination - CBC - Comprehensive metabolic panel - Lipid panel  6. Elevated LDL cholesterol level - Lipid panel  7. Screening examination for STD (sexually transmitted disease) - RPR - HIV Antibody (routine testing w rflx) - Hepatitis C antibody - SURESWAB CT/NG/T. vaginalis

## 2021-03-24 ENCOUNTER — Ambulatory Visit (INDEPENDENT_AMBULATORY_CARE_PROVIDER_SITE_OTHER): Payer: No Typology Code available for payment source | Admitting: Obstetrics and Gynecology

## 2021-03-24 ENCOUNTER — Other Ambulatory Visit: Payer: Self-pay

## 2021-03-24 ENCOUNTER — Encounter: Payer: Self-pay | Admitting: Obstetrics and Gynecology

## 2021-03-24 VITALS — BP 126/74 | HR 78 | Ht 64.0 in | Wt 287.0 lb

## 2021-03-24 DIAGNOSIS — Z0000 Encounter for general adult medical examination without abnormal findings: Secondary | ICD-10-CM

## 2021-03-24 DIAGNOSIS — Z1211 Encounter for screening for malignant neoplasm of colon: Secondary | ICD-10-CM

## 2021-03-24 DIAGNOSIS — Z6841 Body Mass Index (BMI) 40.0 and over, adult: Secondary | ICD-10-CM | POA: Diagnosis not present

## 2021-03-24 DIAGNOSIS — E7800 Pure hypercholesterolemia, unspecified: Secondary | ICD-10-CM

## 2021-03-24 DIAGNOSIS — Z01419 Encounter for gynecological examination (general) (routine) without abnormal findings: Secondary | ICD-10-CM | POA: Diagnosis not present

## 2021-03-24 DIAGNOSIS — Z113 Encounter for screening for infections with a predominantly sexual mode of transmission: Secondary | ICD-10-CM

## 2021-03-24 DIAGNOSIS — Z30431 Encounter for routine checking of intrauterine contraceptive device: Secondary | ICD-10-CM | POA: Diagnosis not present

## 2021-03-24 NOTE — Patient Instructions (Signed)

## 2021-03-25 ENCOUNTER — Other Ambulatory Visit: Payer: Self-pay

## 2021-03-25 ENCOUNTER — Encounter: Payer: Self-pay | Admitting: Obstetrics and Gynecology

## 2021-03-25 ENCOUNTER — Telehealth: Payer: Self-pay

## 2021-03-25 DIAGNOSIS — A599 Trichomoniasis, unspecified: Secondary | ICD-10-CM

## 2021-03-25 DIAGNOSIS — R7303 Prediabetes: Secondary | ICD-10-CM

## 2021-03-25 LAB — COMPREHENSIVE METABOLIC PANEL
AG Ratio: 1.9 (calc) (ref 1.0–2.5)
ALT: 16 U/L (ref 6–29)
AST: 12 U/L (ref 10–30)
Albumin: 4.4 g/dL (ref 3.6–5.1)
Alkaline phosphatase (APISO): 70 U/L (ref 31–125)
BUN/Creatinine Ratio: 12 (calc) (ref 6–22)
BUN: 13 mg/dL (ref 7–25)
CO2: 27 mmol/L (ref 20–32)
Calcium: 9.5 mg/dL (ref 8.6–10.2)
Chloride: 104 mmol/L (ref 98–110)
Creat: 1.13 mg/dL — ABNORMAL HIGH (ref 0.50–0.99)
Globulin: 2.3 g/dL (calc) (ref 1.9–3.7)
Glucose, Bld: 90 mg/dL (ref 65–99)
Potassium: 4.4 mmol/L (ref 3.5–5.3)
Sodium: 138 mmol/L (ref 135–146)
Total Bilirubin: 0.5 mg/dL (ref 0.2–1.2)
Total Protein: 6.7 g/dL (ref 6.1–8.1)

## 2021-03-25 LAB — SURESWAB CT/NG/T. VAGINALIS
C. trachomatis RNA, TMA: NOT DETECTED
N. gonorrhoeae RNA, TMA: NOT DETECTED
Trichomonas vaginalis RNA: DETECTED — AB

## 2021-03-25 MED ORDER — METRONIDAZOLE 500 MG PO TABS: 500.0000 mg | ORAL_TABLET | Freq: Two times a day (BID) | ORAL | 0 refills | Status: DC

## 2021-03-25 NOTE — Telephone Encounter (Signed)
Referral sent to [facility].

## 2021-03-26 LAB — RPR: RPR Ser Ql: NONREACTIVE

## 2021-03-26 LAB — COMPLETE METABOLIC PANEL WITH GFR
AG Ratio: 1.9 (calc) (ref 1.0–2.5)
ALT: 16 U/L (ref 6–29)
AST: 12 U/L (ref 10–30)
Albumin: 4.4 g/dL (ref 3.6–5.1)
Alkaline phosphatase (APISO): 70 U/L (ref 31–125)
BUN/Creatinine Ratio: 12 (calc) (ref 6–22)
BUN: 13 mg/dL (ref 7–25)
CO2: 27 mmol/L (ref 20–32)
Calcium: 9.5 mg/dL (ref 8.6–10.2)
Chloride: 104 mmol/L (ref 98–110)
Creat: 1.13 mg/dL — ABNORMAL HIGH (ref 0.50–0.99)
Globulin: 2.3 g/dL (calc) (ref 1.9–3.7)
Glucose, Bld: 90 mg/dL (ref 65–99)
Potassium: 4.4 mmol/L (ref 3.5–5.3)
Sodium: 138 mmol/L (ref 135–146)
Total Bilirubin: 0.5 mg/dL (ref 0.2–1.2)
Total Protein: 6.7 g/dL (ref 6.1–8.1)
eGFR: 62 mL/min/{1.73_m2} (ref >=60)

## 2021-03-26 LAB — LIPID PANEL
Cholesterol: 212 mg/dL — ABNORMAL HIGH (ref <200)
HDL: 87 mg/dL (ref >=50)
LDL Cholesterol (Calc): 109 mg/dL (calc) — ABNORMAL HIGH
Non-HDL Cholesterol (Calc): 125 mg/dL (calc) (ref <130)
Total CHOL/HDL Ratio: 2.4 (calc) (ref <5.0)
Triglycerides: 73 mg/dL (ref <150)

## 2021-03-26 LAB — CBC
HCT: 41 % (ref 35.0–45.0)
Hemoglobin: 13.7 g/dL (ref 11.7–15.5)
MCH: 29.8 pg (ref 27.0–33.0)
MCHC: 33.4 g/dL (ref 32.0–36.0)
MCV: 89.3 fL (ref 80.0–100.0)
MPV: 10.7 fL (ref 7.5–12.5)
Platelets: 293 10*3/uL (ref 140–400)
RBC: 4.59 10*6/uL (ref 3.80–5.10)
RDW: 13 % (ref 11.0–15.0)
WBC: 6.8 10*3/uL (ref 3.8–10.8)

## 2021-03-26 LAB — HEMOGLOBIN A1C
Hgb A1c MFr Bld: 5.8 % of total Hgb — ABNORMAL HIGH (ref <5.7)
Mean Plasma Glucose: 120 mg/dL
eAG (mmol/L): 6.6 mmol/L

## 2021-03-26 LAB — HEPATITIS C ANTIBODY
Hepatitis C Ab: NONREACTIVE
SIGNAL TO CUT-OFF: 0.03 (ref <1.00)

## 2021-03-26 LAB — TSH: TSH: 1.56 mIU/L

## 2021-03-26 LAB — HIV ANTIBODY (ROUTINE TESTING W REFLEX): HIV 1&2 Ab, 4th Generation: NONREACTIVE

## 2021-03-28 ENCOUNTER — Ambulatory Visit: Payer: No Typology Code available for payment source | Admitting: Obstetrics and Gynecology

## 2021-03-28 NOTE — Progress Notes (Deleted)
GYNECOLOGY  VISIT   HPI: 46 y.o.   Single Black or African American Not Hispanic or Latino  female   [phone] with No LMP recorded. (Menstrual status: IUD).   here for test of cure for trich.      GYNECOLOGIC HISTORY: No LMP recorded. (Menstrual status: IUD). Contraception:IUD  Menopausal hormone therapy: none         OB History     Gravida  5   Para  2   Term  2   Preterm  0   AB  3   Living  2      SAB  0   IAB  3   Ectopic  0   Multiple  0   Live Births  2              Patient Active Problem List   Diagnosis Date Noted   Routine general medical examination at a health care facility 02/07/2016   Essential hypertension 02/07/2016   Respiratory tract infection 02/07/2016   IUD (intrauterine device) in place 02/07/2016   Obesity 12/25/2014   Screen for STD (sexually transmitted disease) 12/25/2014   Screening for cervical cancer 12/25/2014   Need for prophylactic vaccination and inoculation against influenza 12/25/2014   Encounter for health maintenance examination in adult 12/25/2014   Sleep disturbance 12/25/2014   Pelvic pain in female 12/25/2014   Encounter for surveillance of contraceptive pills 02/05/2014   Impaired fasting blood sugar 02/05/2014    Past Medical History:  Diagnosis Date   Abnormal Pap smear 2005   paps normal 2007, 2008, 2009, 2010, 2013, 2014   Back pain    Chlamydia 2016   Herpes gingivostomatitis    History of EKG 02/09/09   normal   History of pregnancy    5 pregnancies, 2 live births, 3 TABs   HSV infection 2018   positive HSV I and II   Hypertension    Impaired fasting blood sugar 2015   Liver hemangioma 2008   per ultrasound and 02/2013 MRI abdomen   Obesity     Past Surgical History:  Procedure Laterality Date   COLPOSCOPY  05/2003   INTRAUTERINE DEVICE (IUD) INSERTION  2017   Mirena   KNEE SURGERY  2000   R knee, ACL, MCL meniscus    Current Outpatient Medications  Medication Sig Dispense Refill    amLODipine (NORVASC) 10 MG tablet TAKE 1 TABLET BY MOUTH EVERY DAY 30 tablet 0   levonorgestrel (MIRENA) 20 MCG/24HR IUD 1 each by Intrauterine route once.     metroNIDAZOLE (FLAGYL) 500 MG tablet Take 1 tablet (500 mg total) by mouth 2 (two) times daily for 7 days. 14 tablet 0   valACYclovir (VALTREX) 500 MG tablet TAKE 1 TABLET BY MOUTH TWICE A DAY FOR 3 DAYS AS NEEDED FOR OUTBREAK 30 tablet 1   No current facility-administered medications for this visit.     ALLERGIES: Patient has no known allergies.  Family History  Problem Relation Age of Onset   Cancer Maternal Uncle        leukemia   Tuberculosis Father    Diabetes Father    Hypertension Father    Heart attack Father    Anesthesia problems Neg Hx    Heart disease Neg Hx    Hyperlipidemia Neg Hx    Stroke Neg Hx     Social History   Socioeconomic History   Marital status: Single    Spouse name: Not on file  Number of children: Not on file   Years of education: Not on file   Highest education level: Not on file  Occupational History   Occupation: Chiropractor    Comment: Scientist, physiological  Tobacco Use   Smoking status: Never   Smokeless tobacco: Never  Vaping Use   Vaping Use: Never used  Substance and Sexual Activity   Alcohol use: No   Drug use: No   Sexual activity: Yes    Partners: Male    Birth control/protection: I.U.D.  Other Topics Concern   Not on file  Social History Narrative   Single, relationship with son's father off and on x 3 years, 2 children, ages 23yo son and 19yo daughter.  Has 1st granddaughter.  Exercise - not much.  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   Social Determinants of Health   Financial Resource Strain: Not on file  Food Insecurity: Not on file  Transportation Needs: Not on file  Physical Activity: Not on file  Stress: Not on file  Social Connections: Not on file  Intimate Partner Violence: Not on file    ROS  PHYSICAL  EXAMINATION:    There were no vitals taken for this visit.    General appearance: alert, cooperative and appears stated age Neck: no adenopathy, supple, symmetrical, trachea midline and thyroid {CHL AMB PHY EX THYROID NORM DEFAULT:[phone]::"normal to inspection and palpation"} Breasts: {Exam; breast:13139::"normal appearance, no masses or tenderness"} Abdomen: soft, non-tender; non distended, no masses,  no organomegaly  Pelvic: External genitalia:  no lesions              Urethra:  normal appearing urethra with no masses, tenderness or lesions              Bartholins and Skenes: normal                 Vagina: normal appearing vagina with normal color and discharge, no lesions              Cervix: {CHL AMB PHY EX CERVIX NORM DEFAULT:[phone]::"no lesions"}              Bimanual Exam:  Uterus:  {CHL AMB PHY EX UTERUS NORM DEFAULT:[phone]::"normal size, contour, position, consistency, mobility, non-tender"}              Adnexa: {CHL AMB PHY EX ADNEXA NO MASS [phone]::"no mass, fullness, tenderness"}              Rectovaginal: {yes no:[id number]}.  Confirms.              Anus:  normal sphincter tone, no lesions  Chaperone was present for exam.  ASSESSMENT     PLAN    An After Visit Summary was printed and given to the patient.  *** minutes face to face time of which over 50% was spent in counseling.

## 2021-03-28 NOTE — Telephone Encounter (Signed)
Scheduled for 05/11/21.

## 2021-03-30 ENCOUNTER — Telehealth: Payer: No Typology Code available for payment source | Admitting: Family Medicine

## 2021-03-30 ENCOUNTER — Other Ambulatory Visit: Payer: Self-pay

## 2021-03-30 ENCOUNTER — Encounter: Payer: Self-pay | Admitting: Family Medicine

## 2021-03-30 ENCOUNTER — Encounter: Payer: Self-pay | Admitting: Medical

## 2021-03-30 VITALS — BP 138/88 | HR 88 | Temp 97.8°F | Ht 64.0 in | Wt 288.0 lb

## 2021-03-30 DIAGNOSIS — H1031 Unspecified acute conjunctivitis, right eye: Secondary | ICD-10-CM

## 2021-03-30 MED ORDER — OFLOXACIN 0.3 % OP SOLN: 1.0000 [drp] | Freq: Four times a day (QID) | OPHTHALMIC | 0 refills | Status: DC

## 2021-03-30 NOTE — Patient Instructions (Signed)
Use the eye drops for 5-7 days (1-2 drops, 4 times daily). If you have increasing redness, swelling, pain or change to vision, you will need to see your eye doctor right away.

## 2021-03-30 NOTE — Progress Notes (Signed)
°  Started as video visit. Needed to be seen in person for evaluation of her eye, so had her come in  History of Present Illness:  Chief Complaint  Patient presents with   Eye Pain    VIRTUAL right eye is red and painful. Some itching and burning. Feels like a rock is inside her eye. Crusting over in the am. Symptoms started Sunday. Has some pain up her jawline to her right ear as well. Has 3 grandchildren-they all had pink eye a few months ago and she did use some of their drops (poly-myxinB-sulfate and trimethoprim) and did not help.   Sunday morning her right eye was very itchy, watery. Has dry, itchy eyes (told by her eye doctor in the past). She used Clear Eyes throughout the day, didn't help as much as usual Monday morning the right eye was completely crusted shut--easily wiped away with a cloth.  Used ABX from 2 months ago that was for her grandkids' pinkeye.  She has been using 3 drops 4-5x/day since Monday.  It is a little less swollen than yesterday, but still itchy, and the discomfort persists. Doesn't really notice much improvement. Feels like there is a rock floating around in her eye, intermittently.  Eye is very watery.  She also notes some dicomfort in front of her R ear.  PMH, PSH, SH reviewed  Outpatient Encounter Medications as of 03/30/2021  Medication Sig Note   ibuprofen (ADVIL) 200 MG tablet Take 800 mg by mouth every 6 (six) hours as needed. 03/30/2021: Last dose last night.   levonorgestrel (MIRENA) 20 MCG/24HR IUD 1 each by Intrauterine route once.    metroNIDAZOLE (FLAGYL) 500 MG tablet Take 1 tablet (500 mg total) by mouth 2 (two) times daily for 7 days. 03/30/2021: Has one day left   amLODipine (NORVASC) 10 MG tablet TAKE 1 TABLET BY MOUTH EVERY DAY (Patient not taking: Reported on 03/30/2021) 03/30/2021: stopped   valACYclovir (VALTREX) 500 MG tablet TAKE 1 TABLET BY MOUTH TWICE A DAY FOR 3 DAYS AS NEEDED FOR OUTBREAK (Patient not taking: Reported on 03/30/2021)  03/30/2021: As needed   No facility-administered encounter medications on file as of 03/30/2021.   No Known Allergies  ROS:  No f/c.  Slight runny nose, but eye is also watery. Some PND, minimally sore throat. Slight pain in front of R ear, R neck No swollen glands (had last week); tested negative for COVID and strep last week. No contact lens use, wears glasses Vision not as great but thinks due to watery eyes. No light sensitivity. No n/v/d.   Observations/Objective:  BP 138/88 Comment: cannot find   Pulse 88    Temp 97.8 F (36.6 C) (Tympanic) Comment: no thermometer   Ht 5\' 4"  (1.626 m)    Wt 288 lb (130.6 kg)    BMI 49.44 kg/m   Pleasant female in no distress. She is alert and oriented. L eye is normal. R eye--moderate conjunctival injection, diffusely.  +watering, clear.  No crusting or purulence noted. Negative fluorescein (no abnormal uptake). No foreign body visualized, no stye or other abnormality. Mild STS of lids, no erythema or warmth. Fundi benign. Neck: no lymphadenopathy  Assessment and Plan:  Acute bacterial conjunctivitis of right eye - localized to 1 eye, minimal response thus far to polytrim. Switch to Ofloxacin. f/u with ophtho if not better. no ucer or corneal abrasion - Plan: ofloxacin (OCUFLOX) 0.3 % ophthalmic solution    Vikki Ports, MD

## 2021-04-01 ENCOUNTER — Telehealth: Payer: Self-pay

## 2021-04-01 ENCOUNTER — Other Ambulatory Visit: Payer: Self-pay

## 2021-04-01 ENCOUNTER — Ambulatory Visit: Payer: No Typology Code available for payment source | Admitting: Medical

## 2021-04-01 VITALS — BP 124/82 | HR 88 | Wt 287.8 lb

## 2021-04-01 DIAGNOSIS — R7303 Prediabetes: Secondary | ICD-10-CM | POA: Diagnosis not present

## 2021-04-01 DIAGNOSIS — IMO0002 Reserved for concepts with insufficient information to code with codable children: Secondary | ICD-10-CM | POA: Insufficient documentation

## 2021-04-01 DIAGNOSIS — R7989 Other specified abnormal findings of blood chemistry: Secondary | ICD-10-CM | POA: Diagnosis not present

## 2021-04-01 DIAGNOSIS — R7301 Impaired fasting glucose: Secondary | ICD-10-CM

## 2021-04-01 DIAGNOSIS — Z136 Encounter for screening for cardiovascular disorders: Secondary | ICD-10-CM

## 2021-04-01 DIAGNOSIS — Z6841 Body Mass Index (BMI) 40.0 and over, adult: Secondary | ICD-10-CM | POA: Insufficient documentation

## 2021-04-01 MED ORDER — WEGOVY 0.25 MG/0.5ML ~~LOC~~ SOAJ: 0.2500 mg | SUBCUTANEOUS | 1 refills | Status: DC

## 2021-04-01 MED ORDER — WEGOVY 0.5 MG/0.5ML ~~LOC~~ SOAJ: 0.5000 mg | SUBCUTANEOUS | 0 refills | Status: DC

## 2021-04-01 NOTE — Telephone Encounter (Signed)
Started prior auth for [employer] via cover my meds. Dx of pre diabetes and BMI of 49.40 used. PA submitted and awaiting coverage determination

## 2021-04-01 NOTE — Patient Instructions (Addendum)
Encounter Diagnoses  Name Primary?   Creatinine elevation Yes   Impaired fasting blood sugar    Prediabetes    Screening for heart disease    BMI 45.0-49.9, adult ([facility])     Elevated creatinine kidney marker We are checking additional labs today related to your recent abnormal finding It is not clear why you have abnormal kidney function as you do not have high blood pressure.  You have been prediabetic though. Continue to hydrate well with water throughout the day to avoid dehydrating the kidneys.  Try to drink 80 to 100 ounces of water minimum per day Due to abnormal kidney function, and in order to protect your kidneys, I recommend you avoid medications that can harm the kidneys such as ibuprofen, Aleve, Advil, Motrin, Naprosyn, or prescription anti-inflammatories which are used for pain, inflammation, and arthritis.   You should avoid dehydration which can harm the kidneys.   Prediabetes Begins sample Wegovy injection weekly.  Start out 0.25 mg weekly for the first month. I sent the 0.5 mg weekly dose to your pharmacy to begin after the first month The most common side effect would be nausea or acid reflux I plan to see you back in 4 to 6 weeks   Obesity Work on efforts through healthy diet and exercise to lose weight Begin back on exercise plan now Begin trial of medication for weight loss and sugar control called Wegovy    Screening for heart disease Consider a test called a CT coronary calcium score to look for cholesterol buildup in your coronary arteries This test is being offered at $100 cash currently.  Let me know if you want to do this.    Prediabetes means you have a higher than normal blood sugar level. It's not high enough to be considered type 2 diabetes yet, but without making some lifestyle changes you are more likely to develop type 2 diabetes.  If you have prediabetes, the long-term damage of diabetes, especially to your heart, blood vessels and kidneys may  already be starting. You may not be able to change certain risk factors such as age, race, or family history, but you CAN make changes to your lifestyle, your eating habits, and your activity.  Although diabetes can develop at any age, the risk of prediabetes increases after age 12.  Your risk of prediabetes increases if you have a parent or sibling with type 2 diabetes.   Although it's unclear why, certain people including Black, Hispanic, American [place] American people, are more likely to develop prediabetes.  Ways to prevent or slow progression to diabetes: Eat healthy foods - Eating red meat and processed meat, and drinking sugar-sweetened beverages, is associated with a higher risk of prediabetes. A diet high in fruits, vegetables, nuts, whole grains and olive oil is associated with a lower risk of prediabetes. Get at least 150 minutes of moderate aerobic physical activity a week, or about 30 minutes on most days of the week.  The less active you are, the greater your risk of prediabetes. Physical activity helps you control your weight, uses up sugar for energy and makes the body use insulin more effectively. Lose excess weight - Being overweight is a primary risk factor for prediabetes. The more fatty tissue you have, especially inside and between the muscle and skin around your abdomen, the more resistant your cells become to insulin. Waist size. A large waist size can indicate insulin resistance. The risk of insulin resistance goes up for men  with waists larger than 40 inches and for women with waists larger than 35 inches. Control your blood pressure and cholesterol.  If your blood pressure is not 130/80 or less, discuss with your provider to help get this under control.  It is ideal to have an HDL good cholesterol number >50 and have a LDL bad cholesterol number <100.   Don't smoke One simple strategy to help you make good food choices and eat appropriate portions sizes is to divide up  your plate. These three divisions on your plate promote healthy eating:  One-half: fruit and nonstarchy vegetables One-quarter: whole grains One-quarter: protein-rich foods, such as legumes, fish or lean meats   Creatine Information Creatine is a chemical that muscles need in order to tighten (contract) during activity. Creatine helps the body produce a molecule called adenosine triphosphate (ATP). ATP is the main source of energy that muscles need to function. It creates a small amount of energy very quickly without the need for oxygen. In some cases, creatine supplementation may make this process faster and more efficient. Creatine is naturally produced in the body and is found in certain foods, such as fish and red meat. It is also available as a dietary supplement, which is usually in the form of a powder or a sports drink. Why is creatine used in athletics? Creatine supplements may be used to: Build muscle. Increase muscle strength. Increase the rate of recovery from injury. Improve performance in short-duration, high-intensity exercise. Increase training intensity. Improve the quality of workouts. What are the side effects of using creatine? Possible side effects of creatine supplements include: Muscle cramps. Muscle tightness. Weight gain. Nausea. Stomach cramps. Diarrhea. A decrease in your body's ability to produce creatine naturally after you stop using synthetic creatine supplements. What do I need to know about creatine use? General information Usually, professional sports associations do not ban the use of creatine supplements. Creatine may help to improve athletic performance in young, healthy people during short periods of rigorous physical activity, such as sprinting or weight lifting. However, creatine supplements do not improve athletic performance in every person who uses them. Some athletes eat carbohydrates, such as bread, pasta, and potatoes (carbohydrate loading)  along with creatine supplements to enhance energy levels and performance even more. Warnings Do not take creatine if you have a history of kidney disease or you have conditions such as diabetes that increase your risk of kidney problems. Do not combine creatine supplements with caffeine or other stimulants or with herbal performance enhancers. Doing this could increase your risk for serious side effects, including stroke. Take these actions to lower your risk of side effects when you use creatine supplements: Drink enough water to keep your urine pale yellow. Make sure you get the recommended amount (Recommended Dietary Allowance, or RDA) of potassium. Summary Creatine is a chemical that muscles need in order to tighten (contract) during activity. Creatine is naturally produced in the body and is found in certain foods, such as fish and red meat. Creatine is available as a dietary supplement, which is usually in the form of a powder or a sports drink. Usually, professional sports associations do not ban the use of creatine supplements. This information is not intended to replace advice given to you by your health care provider. Make sure you discuss any questions you have with your health care provider. Document Revised: 01/15/2018 Document Reviewed: 01/15/2018 Elsevier Patient Education  2022 [employer].    Sample Meal Plan:  Breakfast You may eat  1 of the following Omelette, which can include a small amount of cheese, and vegetables such as peppers, mushrooms, small pieces of [place] or chicken Low sugar yogurt serving which can include some fruit such as berries Egg whites or hard boiled egg and meat (1-2 strips of bacon, or small piece of [place] sausage or [place] bacon)   Mid-morning snack 1 fruit serving such as one of the following: medium-sized apple medium-sized orange, Tangerine 1/2 banana  3/4 cup of fresh berries or frozen berries  A protein source such as one of the  following: 8 almonds  small handful of walnuts or other nuts small piece of cheese, low sugar yogurt   Lunch A protein source such as 1 of the following: 1 serving of beans such as black beans, pinto beans, green beans, or edamame (soy beans) 1 meat serving such as 6 oz or deck of card size serving of fish, skinless chicken, or [place], either grilled or baked preferably.   You can use some pork or beef, but limit this compared to fish, chicken or [place] Vegetable - Half of your plate should be a non-starchy vegetables!  So avoid white potatoes and corn.  Otherwise, eat a large portion of vegetables. Avocado, cucumber, tomato, carrots, greens, lettuce, squash, okra, etc.  Vegetables can include salad with olive oil/vinaigrette dressing   Mid-afternoon snack 1 fruit serving such as one of the following: medium-sized apple medium-sized orange, Tangerine 1/2 banana  3/4 cup of fresh berries or frozen berries  A protein source such as one of the following: 8 almonds  small handful of walnuts or other nuts small piece of cheese, low sugar yogurt   Dinner A protein source such as 1 of the following: 1 serving of beans such as black beans, pinto beans, green beans, or edamame (soy beans) 1 meat serving such as 6 oz or deck of card size serving of fish, skinless chicken, or [place], either grilled or baked preferably.   You can use some pork or beef, but limit this compared to fish, chicken or [place] Vegetable - Half of your plate should be a non-starchy vegetables!  So avoid white potatoes and corn.  Otherwise, eat a large portion of vegetables. Avocado, cucumber, tomato, carrots, greens, lettuce, squash, okra, etc.  Vegetables can include salad with olive oil/vinaigrette dressing   Beverages: Water Unsweet tea Home made juice with a juicer without sugar added other than small bit of honey or agave nectar Water with sugar free flavor such as [place]   AVOID.... For the time being I  want you to cut out the following items completely: Soda, sweet tea, juice, beer or wine or alcohol ALL grains and breads including rice, pasta, bread, cereal Sweets such as cake, candy, pies, chips, cookies, chocolate

## 2021-04-01 NOTE — Progress Notes (Signed)
Subjective:  Joanne Johns is a 46 y.o. female who presents for Chief Complaint  Patient presents with   follow-up    Follow-up on blood work      Here for f/u on some labs she had done with gynecology.  Elevated creatinine on recent labs.  Uses ibuprofen every week but uses it occasionally for pains.   She had taken some around the time of recent labs through gynecology.  No prior hx/o HTN but has had prediabetes levels on labs.  No recent dehydration.  Prediabets - no current symptoms.  Not currently exercising.   Father died of MI, age 52.  No other aggravating or relieving factors.    No other c/o.  Past Medical History:  Diagnosis Date   Abnormal Pap smear 2005   paps normal 2007, 2008, 2009, 2010, 2013, 2014   Back pain    Chlamydia 2016   Herpes gingivostomatitis    History of EKG 02/09/09   normal   History of pregnancy    5 pregnancies, 2 live births, 3 TABs   HSV infection 2018   positive HSV I and II   Hypertension    Impaired fasting blood sugar 2015   Liver hemangioma 2008   per ultrasound and 02/2013 MRI abdomen   Obesity    Current Outpatient Medications on File Prior to Visit  Medication Sig Dispense Refill   ibuprofen (ADVIL) 200 MG tablet Take 800 mg by mouth every 6 (six) hours as needed.     levonorgestrel (MIRENA) 20 MCG/24HR IUD 1 each by Intrauterine route once.     ofloxacin (OCUFLOX) 0.3 % ophthalmic solution Place 1-2 drops into the right eye 4 (four) times daily. 5 mL 0   valACYclovir (VALTREX) 500 MG tablet TAKE 1 TABLET BY MOUTH TWICE A DAY FOR 3 DAYS AS NEEDED FOR OUTBREAK 30 tablet 1   amLODipine (NORVASC) 10 MG tablet TAKE 1 TABLET BY MOUTH EVERY DAY (Patient not taking: Reported on 04/01/2021) 30 tablet 0   No current facility-administered medications on file prior to visit.   Family History  Problem Relation Age of Onset   Cancer Maternal Uncle        leukemia   Tuberculosis Father    Diabetes Father    Hypertension Father     Heart attack Father    Anesthesia problems Neg Hx    Heart disease Neg Hx    Hyperlipidemia Neg Hx    Stroke Neg Hx     The following portions of the patient's history were reviewed and updated as appropriate: allergies, current medications, past family history, past medical history, past social history, past surgical history and problem list.  ROS Otherwise as in subjective above    Objective: BP 124/82    Pulse 88    Wt 287 lb 12.8 oz (130.5 kg)    BMI 49.40 kg/m   Wt Readings from Last 3 Encounters:  04/01/21 287 lb 12.8 oz (130.5 kg)  03/30/21 288 lb (130.6 kg)  03/24/21 287 lb (130.2 kg)   BP Readings from Last 3 Encounters:  04/01/21 124/82  03/30/21 138/88  03/24/21 126/74    General appearance: alert, no distress, well developed, well nourished Neck: supple, no lymphadenopathy, no thyromegaly, no masses Heart: RRR, normal S1, S2, no murmurs Lungs: CTA bilaterally, no wheezes, rhonchi, or rales Abdomen: nontender, no mass, no organomegaly Pulses: 2+ radial pulses, 2+ pedal pulses, normal cap refill Ext: no edema   Assessment: Encounter Diagnoses  Name  Primary?   Creatinine elevation Yes   Impaired fasting blood sugar    Prediabetes    Screening for heart disease    BMI 45.0-49.9, adult ([employer])      Plan: I reviewed recent labs done through gynecology showing elevated creatinine, prediabetes.  Other labs are okay  Elevated creatinine kidney marker We are checking additional labs today related to your recent abnormal finding It is not clear why you have abnormal kidney function as you do not have high blood pressure.  You have been prediabetic though. Continue to hydrate well with water throughout the day to avoid dehydrating the kidneys.  Try to drink 80 to 100 ounces of water minimum per day Due to abnormal kidney function, and in order to protect your kidneys, I recommend you avoid medications that can harm the kidneys such as ibuprofen, Aleve, Advil,  Motrin, Naprosyn, or prescription anti-inflammatories which are used for pain, inflammation, and arthritis.   You should avoid dehydration which can harm the kidneys.   Prediabetes Begins sample Wegovy injection weekly.  Start out 0.25 mg weekly for the first month. I sent the 0.5 mg weekly dose to your pharmacy to begin after the first month The most common side effect would be nausea or acid reflux I plan to see you back in 4 to 6 weeks   Obesity Work on efforts through healthy diet and exercise to lose weight Begin back on exercise plan now Begin trial of medication for weight loss and sugar control called Wegovy Included a sample meal plan with her summary today   Screening for heart disease Consider a test called a CT coronary calcium score to look for cholesterol buildup in your coronary arteries This test is being offered at $100 cash currently.  Let me know if you want to do this.   Leilanni was seen today for follow-up.  Diagnoses and all orders for this visit:  Creatinine elevation -     Urinalysis, Routine w reflex microscopic -     Microalbumin/Creatinine Ratio, Urine -     ANA  Impaired fasting blood sugar  Prediabetes  Screening for heart disease  BMI 45.0-49.9, adult ([place])  Other orders -     Discontinue: Semaglutide-Weight Management (WEGOVY) 0.25 MG/0.5ML SOAJ; Inject 0.25 mg into the skin once a week. -     Semaglutide-Weight Management (WEGOVY) 0.5 MG/0.5ML SOAJ; Inject 0.5 mg into the skin once a week.    Follow up: pending labs, 70mo

## 2021-04-03 LAB — MICROSCOPIC EXAMINATION
Bacteria, UA: NONE SEEN
Casts: NONE SEEN /lpf
WBC, UA: NONE SEEN /hpf (ref 0–5)

## 2021-04-03 LAB — URINALYSIS, ROUTINE W REFLEX MICROSCOPIC
Bilirubin, UA: NEGATIVE
Glucose, UA: NEGATIVE
Leukocytes,UA: NEGATIVE
Nitrite, UA: NEGATIVE
Specific Gravity, UA: 1.024 (ref 1.005–1.030)
Urobilinogen, Ur: 0.2 mg/dL (ref 0.2–1.0)
pH, UA: 6 (ref 5.0–7.5)

## 2021-04-03 LAB — MICROALBUMIN / CREATININE URINE RATIO
Creatinine, Urine: 202.6 mg/dL
Microalb/Creat Ratio: 10 mg/g creat (ref 0–29)
Microalbumin, Urine: 20.6 ug/mL

## 2021-04-03 LAB — ANA: Anti Nuclear Antibody (ANA): NEGATIVE

## 2021-04-05 NOTE — Telephone Encounter (Signed)
Appeal sent to plan via cover my meds.  

## 2021-04-14 NOTE — Progress Notes (Deleted)
GYNECOLOGY  VISIT   HPI: 46 y.o.   Single Black or African American Not Hispanic or Latino  female   [phone] with No LMP recorded. (Menstrual status: IUD).   here for 4 week test of cure for Trichomonas.    GYNECOLOGIC HISTORY: No LMP recorded. (Menstrual status: IUD). Contraception:IUD Mirena Placed 7/17  Menopausal hormone therapy: none         OB History     Gravida  5   Para  2   Term  2   Preterm  0   AB  3   Living  2      SAB  0   IAB  3   Ectopic  0   Multiple  0   Live Births  2              Patient Active Problem List   Diagnosis Date Noted   Creatinine elevation 04/01/2021   Prediabetes 04/01/2021   Screening for heart disease 04/01/2021   BMI 45.0-49.9, adult ([place]) 04/01/2021   Routine general medical examination at a health care facility 02/07/2016   Essential hypertension 02/07/2016   Respiratory tract infection 02/07/2016   IUD (intrauterine device) in place 02/07/2016   Obesity 12/25/2014   Screen for STD (sexually transmitted disease) 12/25/2014   Screening for cervical cancer 12/25/2014   Need for prophylactic vaccination and inoculation against influenza 12/25/2014   Encounter for health maintenance examination in adult 12/25/2014   Sleep disturbance 12/25/2014   Pelvic pain in female 12/25/2014   Encounter for surveillance of contraceptive pills 02/05/2014   Impaired fasting blood sugar 02/05/2014    Past Medical History:  Diagnosis Date   Abnormal Pap smear 2005   paps normal 2007, 2008, 2009, 2010, 2013, 2014   Back pain    Chlamydia 2016   Herpes gingivostomatitis    History of EKG 02/09/09   normal   History of pregnancy    5 pregnancies, 2 live births, 3 TABs   HSV infection 2018   positive HSV I and II   Hypertension    Impaired fasting blood sugar 2015   Liver hemangioma 2008   per ultrasound and 02/2013 MRI abdomen   Obesity     Past Surgical History:  Procedure Laterality Date   COLPOSCOPY  05/2003    INTRAUTERINE DEVICE (IUD) INSERTION  2017   Mirena   KNEE SURGERY  2000   R knee, ACL, MCL meniscus    Current Outpatient Medications  Medication Sig Dispense Refill   amLODipine (NORVASC) 10 MG tablet TAKE 1 TABLET BY MOUTH EVERY DAY (Patient not taking: Reported on 04/01/2021) 30 tablet 0   ibuprofen (ADVIL) 200 MG tablet Take 800 mg by mouth every 6 (six) hours as needed.     levonorgestrel (MIRENA) 20 MCG/24HR IUD 1 each by Intrauterine route once.     ofloxacin (OCUFLOX) 0.3 % ophthalmic solution Place 1-2 drops into the right eye 4 (four) times daily. 5 mL 0   Semaglutide-Weight Management (WEGOVY) 0.5 MG/0.5ML SOAJ Inject 0.5 mg into the skin once a week. 2 mL 0   valACYclovir (VALTREX) 500 MG tablet TAKE 1 TABLET BY MOUTH TWICE A DAY FOR 3 DAYS AS NEEDED FOR OUTBREAK 30 tablet 1   No current facility-administered medications for this visit.     ALLERGIES: Patient has no known allergies.  Family History  Problem Relation Age of Onset   Cancer Maternal Uncle        leukemia  Tuberculosis Father    Diabetes Father    Hypertension Father    Heart attack Father    Anesthesia problems Neg Hx    Heart disease Neg Hx    Hyperlipidemia Neg Hx    Stroke Neg Hx     Social History   Socioeconomic History   Marital status: Single    Spouse name: Not on file   Number of children: Not on file   Years of education: Not on file   Highest education level: Not on file  Occupational History   Occupation: Chiropractor    Comment: Scientist, physiological  Tobacco Use   Smoking status: Never   Smokeless tobacco: Never  Vaping Use   Vaping Use: Never used  Substance and Sexual Activity   Alcohol use: No   Drug use: No   Sexual activity: Yes    Partners: Male    Birth control/protection: I.U.D.  Other Topics Concern   Not on file  Social History Narrative   Single, relationship with son's father off and on x 3 years, 2 children, ages 60yo son and 84yo daughter.  Has 1st  granddaughter.  Exercise - not much.  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   Social Determinants of Health   Financial Resource Strain: Not on file  Food Insecurity: Not on file  Transportation Needs: Not on file  Physical Activity: Not on file  Stress: Not on file  Social Connections: Not on file  Intimate Partner Violence: Not on file    ROS  PHYSICAL EXAMINATION:    There were no vitals taken for this visit.    General appearance: alert, cooperative and appears stated age Neck: no adenopathy, supple, symmetrical, trachea midline and thyroid {CHL AMB PHY EX THYROID NORM [phone]::"normal to inspection and palpation"} Breasts: {Exam; breast:13139::"normal appearance, no masses or tenderness"} Abdomen: soft, non-tender; non distended, no masses,  no organomegaly  Pelvic: External genitalia:  no lesions              Urethra:  normal appearing urethra with no masses, tenderness or lesions              Bartholins and Skenes: normal                 Vagina: normal appearing vagina with normal color and discharge, no lesions              Cervix: {CHL AMB PHY EX CERVIX NORM [phone]::"no lesions"}              Bimanual Exam:  Uterus:  {CHL AMB PHY EX UTERUS NORM DEFAULT:[id number]::"normal size, contour, position, consistency, mobility, non-tender"}              Adnexa: {CHL AMB PHY EX ADNEXA NO MASS DEFAULT:[phone]::"no mass, fullness, tenderness"}              Rectovaginal: {yes no:[id number]}.  Confirms.              Anus:  normal sphincter tone, no lesions  Chaperone was present for exam.  ASSESSMENT     PLAN    An After Visit Summary was printed and given to the patient.  *** minutes face to face time of which over 50% was spent in counseling.

## 2021-04-20 ENCOUNTER — Ambulatory Visit: Payer: No Typology Code available for payment source | Admitting: Obstetrics and Gynecology

## 2021-04-20 DIAGNOSIS — Z0289 Encounter for other administrative examinations: Secondary | ICD-10-CM

## 2021-04-26 ENCOUNTER — Telehealth: Payer: Self-pay

## 2021-04-26 NOTE — Telephone Encounter (Signed)
P.A. WEGOVY ([place])

## 2021-04-29 NOTE — Telephone Encounter (Signed)
[place] also denied the [facility] also stating this is plan exclusion, that they do not cover any treatment for obesity except for surgical treatment.  Do you want to try a diabetes med like Trulicity or Ozempic since she has prediabetes and see if we can get that covered

## 2021-05-02 NOTE — Telephone Encounter (Signed)
Pt was notified and will call back

## 2021-05-11 ENCOUNTER — Ambulatory Visit: Payer: BLUE CROSS/BLUE SHIELD | Admitting: Registered"

## 2021-08-29 ENCOUNTER — Other Ambulatory Visit: Payer: Self-pay | Admitting: Obstetrics and Gynecology

## 2021-08-29 DIAGNOSIS — A6009 Herpesviral infection of other urogenital tract: Secondary | ICD-10-CM

## 2021-08-29 NOTE — Telephone Encounter (Signed)
Last annual exam 03/2021 

## 2021-09-27 ENCOUNTER — Ambulatory Visit (INDEPENDENT_AMBULATORY_CARE_PROVIDER_SITE_OTHER): Payer: Medicaid Other | Admitting: Obstetrics and Gynecology

## 2021-09-27 ENCOUNTER — Encounter: Payer: Self-pay | Admitting: Obstetrics and Gynecology

## 2021-09-27 VITALS — BP 110/72 | HR 77 | Ht 64.0 in | Wt 288.0 lb

## 2021-09-27 DIAGNOSIS — R198 Other specified symptoms and signs involving the digestive system and abdomen: Secondary | ICD-10-CM

## 2021-09-27 DIAGNOSIS — Z1211 Encounter for screening for malignant neoplasm of colon: Secondary | ICD-10-CM

## 2021-09-27 DIAGNOSIS — K648 Other hemorrhoids: Secondary | ICD-10-CM

## 2021-09-27 NOTE — Progress Notes (Signed)
GYNECOLOGY  VISIT   HPI: 45 y.o.   Single Black or African American Not Hispanic or Latino  female   [phone] with No LMP recorded. (Menstrual status: IUD).   here for possible hemorrhoids. She has an intermittent weird sensation in her anus. Not painful. Feels like something is trying to get out.  She states her stool is softer in the last 2-3 weeks, she is having 1-3 BM's a day in the last few weeks. No blood in her stool. After the softer stool started she started noticed the sensation in her anus.   GYNECOLOGIC HISTORY: No LMP recorded. (Menstrual status: IUD). Contraception:IUD  Menopausal hormone therapy: none         OB History     Gravida  5   Para  2   Term  2   Preterm  0   AB  3   Living  2      SAB  0   IAB  3   Ectopic  0   Multiple  0   Live Births  2              Patient Active Problem List   Diagnosis Date Noted   Creatinine elevation 04/01/2021   Prediabetes 04/01/2021   Screening for heart disease 04/01/2021   BMI 45.0-49.9, adult ([place]) 04/01/2021   Routine general medical examination at a health care facility 02/07/2016   Essential hypertension 02/07/2016   Respiratory tract infection 02/07/2016   IUD (intrauterine device) in place 02/07/2016   Obesity 12/25/2014   Screen for STD (sexually transmitted disease) 12/25/2014   Screening for cervical cancer 12/25/2014   Need for prophylactic vaccination and inoculation against influenza 12/25/2014   Encounter for health maintenance examination in adult 12/25/2014   Sleep disturbance 12/25/2014   Pelvic pain in female 12/25/2014   Encounter for surveillance of contraceptive pills 02/05/2014   Impaired fasting blood sugar 02/05/2014    Past Medical History:  Diagnosis Date   Abnormal Pap smear 2005   paps normal 2007, 2008, 2009, 2010, 2013, 2014   Back pain    Chlamydia 2016   Herpes gingivostomatitis    History of EKG 02/09/09   normal   History of pregnancy    5 pregnancies, 2  live births, 3 TABs   HSV infection 2018   positive HSV I and II   Hypertension    Impaired fasting blood sugar 2015   Liver hemangioma 2008   per ultrasound and 02/2013 MRI abdomen   Obesity     Past Surgical History:  Procedure Laterality Date   COLPOSCOPY  05/2003   INTRAUTERINE DEVICE (IUD) INSERTION  2017   Mirena   KNEE SURGERY  2000   R knee, ACL, MCL meniscus    Current Outpatient Medications  Medication Sig Dispense Refill   ibuprofen (ADVIL) 200 MG tablet Take 800 mg by mouth every 6 (six) hours as needed.     levonorgestrel (MIRENA) 20 MCG/24HR IUD 1 each by Intrauterine route once.     valACYclovir (VALTREX) 500 MG tablet TAKE 1 TABLET BY MOUTH TWICE DAILY FOR 3 DAYS AS NEEDED FOR OUTBREAK 30 tablet 1   amLODipine (NORVASC) 10 MG tablet TAKE 1 TABLET BY MOUTH EVERY DAY (Patient not taking: Reported on 04/01/2021) 30 tablet 0   No current facility-administered medications for this visit.     ALLERGIES: Patient has no known allergies.  Family History  Problem Relation Age of Onset   Cancer Maternal Uncle  leukemia   Tuberculosis Father    Diabetes Father    Hypertension Father    Heart attack Father    Anesthesia problems Neg Hx    Heart disease Neg Hx    Hyperlipidemia Neg Hx    Stroke Neg Hx     Social History   Socioeconomic History   Marital status: Single    Spouse name: Not on file   Number of children: Not on file   Years of education: Not on file   Highest education level: Not on file  Occupational History   Occupation: Chiropractor    Comment: Scientist, physiological  Tobacco Use   Smoking status: Never   Smokeless tobacco: Never  Vaping Use   Vaping Use: Never used  Substance and Sexual Activity   Alcohol use: No   Drug use: No   Sexual activity: Yes    Partners: Male    Birth control/protection: I.U.D.  Other Topics Concern   Not on file  Social History Narrative   Single, relationship with son's father off and on x 3 years,  2 children, ages 65yo son and 47yo daughter.  Has 1st granddaughter.  Exercise - not much.  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   Social Determinants of Health   Financial Resource Strain: Not on file  Food Insecurity: Not on file  Transportation Needs: Not on file  Physical Activity: Not on file  Stress: Not on file  Social Connections: Not on file  Intimate Partner Violence: Not on file    Review of Systems  All other systems reviewed and are negative.   PHYSICAL EXAMINATION:    BP 110/72   Pulse 77   Ht '5\' 4"'$  (1.626 m)   Wt 288 lb (130.6 kg)   SpO2 100%   BMI 49.44 kg/m     General appearance: alert, cooperative and appears stated age  Pelvic: External genitalia:  no lesions              Urethra:  normal appearing urethra with no masses, tenderness or lesions              Bartholins and Skenes: normal                               Rectovaginal: no masses              Anus: no external hemorrhoid noted, normal sphincter tone, no lesions  Chaperone was present for exam.  1. Anal symptoms Suspect she has a hemorrhoid, not noted on exam today -discussed hemorrhoids, information given - Ambulatory referral to Gastroenterology  2. Colon cancer screening She is 46, needs screening - Ambulatory referral to Gastroenterology

## 2021-09-27 NOTE — Patient Instructions (Signed)

## 2021-10-05 ENCOUNTER — Ambulatory Visit: Payer: Medicaid Other | Admitting: Medical

## 2021-10-05 VITALS — BP 124/70 | HR 64 | Temp 98.7°F | Wt 285.8 lb

## 2021-10-05 DIAGNOSIS — G8929 Other chronic pain: Secondary | ICD-10-CM

## 2021-10-05 DIAGNOSIS — Z9889 Other specified postprocedural states: Secondary | ICD-10-CM

## 2021-10-05 DIAGNOSIS — M2142 Flat foot [pes planus] (acquired), left foot: Secondary | ICD-10-CM

## 2021-10-05 DIAGNOSIS — R7989 Other specified abnormal findings of blood chemistry: Secondary | ICD-10-CM | POA: Diagnosis not present

## 2021-10-05 DIAGNOSIS — M2141 Flat foot [pes planus] (acquired), right foot: Secondary | ICD-10-CM | POA: Diagnosis not present

## 2021-10-05 DIAGNOSIS — M25561 Pain in right knee: Secondary | ICD-10-CM

## 2021-10-05 MED ORDER — HYDROCODONE-ACETAMINOPHEN 5-325 MG PO TABS: 1.0000 | ORAL_TABLET | Freq: Two times a day (BID) | ORAL | 0 refills | Status: DC | PRN

## 2021-10-05 NOTE — Patient Instructions (Signed)
We are referring to orthopedics  Consider using OTC supplement Glucosamine Chondroitin daily in the meantime  For pain control: Use tylenol when needed You can do the Ibuprofen 1/2 tablet of your '800mg'$  tablet, up to twice daily for a few days at a time I prescribe stronger pain medication for worse pain for as needed use  Consider cold therapy/ice water therapy 20 minutes at a time   Follow up with orthopedic

## 2021-10-05 NOTE — Progress Notes (Signed)
Subjective:  Joanne Johns is a 46 y.o. female who presents for Chief Complaint  Patient presents with   Knee Pain    Knee pain x 3 months. Had knee surgery 23 years ago.     Here for knee pain.  Started walking regularly for exercise earlier in the year.  Tried to do some light jogging.   The next morning after she went from walking to jogging, couldn't get up due to knee pain.  Denies swelling.  Been having knee pain ever since.  No issues with right hip or ankle.  Left leg is fine.  Doesn't recall other injury or fall.   No other aggravating or relieving factors.    No other c/o.  Past Surgical History:  Procedure Laterality Date   COLPOSCOPY  05/2003   INTRAUTERINE DEVICE (IUD) INSERTION  2017   Mirena   KNEE SURGERY  2000   R knee, ACL, MCL meniscus   The following portions of the patient's history were reviewed and updated as appropriate: allergies, current medications, past family history, past medical history, past social history, past surgical history and problem list.  ROS Otherwise as in subjective above    Objective: BP 124/70   Pulse 64   Temp 98.7 F (37.1 C)   Wt 285 lb 12.8 oz (129.6 kg)   BMI 49.06 kg/m   General appearance: alert, no distress, well developed, well nourished MSK: right knee tender MCL area, tender superior medial patella, crepitus noted with ROM, pain noted with knee extension, no obvious laxity or swelling, anterior surgical scar noted, otherwise leg nontender and ankle and hip ROM normal, flat feet bilat, otherwise left leg unremarkable Legs neurovascularly intact    Assessment: Encounter Diagnoses  Name Primary?   Chronic pain of right knee Yes   History of knee surgery    Flat feet    Creatinine elevation      Plan: Discussed findings and recommendations as below  Patient Instructions  We are referring to orthopedics  Consider using OTC supplement Glucosamine Chondroitin daily in the meantime  For pain control: Use  tylenol when needed You can do the Ibuprofen 1/2 tablet of your '800mg'$  tablet, up to twice daily for a few days at a time I prescribe stronger pain medication for worse pain for as needed use  Consider cold therapy/ice water therapy 20 minutes at a time   Follow up with orthopedic    Joanne Johns was seen today for knee pain.  Diagnoses and all orders for this visit:  Chronic pain of right knee -     AMB referral to orthopedics  History of knee surgery -     AMB referral to orthopedics  Flat feet -     AMB referral to orthopedics  Creatinine elevation  Other orders -     HYDROcodone-acetaminophen (NORCO) 5-325 MG tablet; Take 1 tablet by mouth 2 (two) times daily as needed.   Follow up: pending ortho consult

## 2021-10-06 ENCOUNTER — Ambulatory Visit: Payer: Self-pay

## 2021-10-06 ENCOUNTER — Encounter: Payer: Self-pay | Admitting: Surgery

## 2021-10-06 ENCOUNTER — Ambulatory Visit: Payer: Medicaid Other | Admitting: Surgery

## 2021-10-06 VITALS — BP 188/137 | HR 88 | Ht 64.0 in | Wt 285.0 lb

## 2021-10-06 DIAGNOSIS — M25561 Pain in right knee: Secondary | ICD-10-CM | POA: Diagnosis not present

## 2021-10-06 DIAGNOSIS — M1731 Unilateral post-traumatic osteoarthritis, right knee: Secondary | ICD-10-CM

## 2021-10-06 DIAGNOSIS — G8929 Other chronic pain: Secondary | ICD-10-CM | POA: Diagnosis not present

## 2021-10-06 MED ORDER — BUPIVACAINE HCL 0.5 % IJ SOLN
6.0000 mL | INTRAMUSCULAR | Status: AC | PRN
  Administered 2021-10-06: 6 mL via INTRA_ARTICULAR

## 2021-10-06 MED ORDER — LIDOCAINE HCL 1 % IJ SOLN
3.0000 mL | INTRAMUSCULAR | Status: AC | PRN
  Administered 2021-10-06: 3 mL

## 2021-10-06 MED ORDER — METHYLPREDNISOLONE ACETATE 40 MG/ML IJ SUSP
80.0000 mg | INTRAMUSCULAR | Status: AC | PRN
  Administered 2021-10-06: 80 mg via INTRA_ARTICULAR

## 2021-10-06 NOTE — Progress Notes (Signed)
Office Visit Note   Patient: Joanne Johns           Date of Birth: February 27, 1976           MRN: [id number] Visit Date: 10/06/2021              Requested by: Carlena Hurl, PA-C [address] [place],  [place] [address] PCP: Carlena Hurl, PA-C   Assessment & Plan: Visit Diagnoses:  1. Chronic pain of right knee   2. Post-traumatic osteoarthritis of right knee     Plan: In hopes to give the patient relief of her right knee pain offered injection.  Patient consent right knee was prepped with Betadine and intra-articular Marcaine/Depo-Medrol injection was performed.  Tolerated without complication.  Follow-up in 4 weeks for recheck.  Advised patient that she does have a fair amount of degenerative changes throughout the knee and ultimately she may come down to needing definitive treatment with total knee replacement.  We will try to exhaust all conservative measures before going that route.  I did discuss possibly trying viscosupplementation depending on how she does with today's injection.  Follow-Up Instructions: Return in about 4 weeks (around 11/03/2021) for with Atley Scarboro recheck right knee after injection.   Orders:  Orders Placed This Encounter  Procedures   Large Joint Inj   XR KNEE 3 VIEW RIGHT   No orders of the defined types were placed in this encounter.     Procedures: Large Joint Inj: R knee on 10/06/2021 11:11 AM Indications: pain Details: 25 G 1.5 in needle, anteromedial approach Medications: 3 mL lidocaine 1 %; 6 mL bupivacaine 0.5 %; 80 mg methylPREDNISolone acetate 40 MG/ML Outcome: tolerated well, no immediate complications Consent was given by the patient. Patient was prepped and draped in the usual sterile fashion.       Clinical Data: No additional findings.   Subjective: Chief Complaint  Patient presents with   Right Knee - Pain    HPI 46 year old black female comes in today with complaints of right knee pain.  She is a new patient to our  practice.  She is status post right knee ACL reconstruction in 2000 through another practice.  States that over the years she had chronic right knee pain that has been bothersome with all activity but this has been worse over the last 3 months.  No recent injury.  Denies instability or feeling mechanical symptoms.  Pain more so patellofemoral and medial compartment.  She has tried oral NSAIDs without any improvement. Review of Systems No current cardiopulmonary GI/GU issue  Objective: Vital Signs: BP (!) 188/137 (BP Location: Left Arm, Patient Position: Sitting, Cuff Size: Large)   Pulse 88   Ht '5\' 4"'$  (1.626 m)   Wt 285 lb (129.3 kg)   BMI 48.92 kg/m   Physical Exam HENT:     Head: Normocephalic and atraumatic.  Eyes:     Extraocular Movements: Extraocular movements intact.  Pulmonary:     Effort: No respiratory distress.  Musculoskeletal:     Comments: Gait is antalgic.  Negative log about his.  Negative straight leg raise.  Right knee she has some swelling without large effusion.  Moderate patellofemoral crepitus.  Positive patellar grind.  Medial greater than lateral joint line tenderness.  Ligaments are stable.  Calf nontender.  Neurological:     Mental Status: She is alert.  Psychiatric:        Mood and Affect: Mood normal.     Ortho Exam  Specialty Comments:  No specialty comments available.  Imaging: No results found.   PMFS History: Patient Active Problem List   Diagnosis Date Noted   Creatinine elevation 04/01/2021   Prediabetes 04/01/2021   Screening for heart disease 04/01/2021   BMI 45.0-49.9, adult ([place]) 04/01/2021   Routine general medical examination at a health care facility 02/07/2016   Essential hypertension 02/07/2016   Respiratory tract infection 02/07/2016   IUD (intrauterine device) in place 02/07/2016   Obesity 12/25/2014   Screen for STD (sexually transmitted disease) 12/25/2014   Screening for cervical cancer 12/25/2014   Need for  prophylactic vaccination and inoculation against influenza 12/25/2014   Encounter for health maintenance examination in adult 12/25/2014   Sleep disturbance 12/25/2014   Pelvic pain in female 12/25/2014   Encounter for surveillance of contraceptive pills 02/05/2014   Impaired fasting blood sugar 02/05/2014   Past Medical History:  Diagnosis Date   Abnormal Pap smear 2005   paps normal 2007, 2008, 2009, 2010, 2013, 2014   Back pain    Chlamydia 2016   Herpes gingivostomatitis    History of EKG 02/09/09   normal   History of pregnancy    5 pregnancies, 2 live births, 3 TABs   HSV infection 2018   positive HSV I and II   Hypertension    Impaired fasting blood sugar 2015   Liver hemangioma 2008   per ultrasound and 02/2013 MRI abdomen   Obesity     Family History  Problem Relation Age of Onset   Cancer Maternal Uncle        leukemia   Tuberculosis Father    Diabetes Father    Hypertension Father    Heart attack Father    Anesthesia problems Neg Hx    Heart disease Neg Hx    Hyperlipidemia Neg Hx    Stroke Neg Hx     Past Surgical History:  Procedure Laterality Date   COLPOSCOPY  05/2003   INTRAUTERINE DEVICE (IUD) INSERTION  2017   Mirena   KNEE SURGERY  2000   R knee, ACL, MCL meniscus   Social History   Occupational History   Occupation: Chiropractor    Comment: Scientist, physiological  Tobacco Use   Smoking status: Never   Smokeless tobacco: Never  Vaping Use   Vaping Use: Never used  Substance and Sexual Activity   Alcohol use: No   Drug use: No   Sexual activity: Yes    Partners: Male    Birth control/protection: I.U.D.

## 2021-11-03 ENCOUNTER — Ambulatory Visit: Payer: Medicaid Other | Admitting: Surgery

## 2021-11-03 DIAGNOSIS — M1731 Unilateral post-traumatic osteoarthritis, right knee: Secondary | ICD-10-CM | POA: Diagnosis not present

## 2021-11-03 NOTE — Progress Notes (Signed)
46 year old black female returns for recheck of her right knee.  She states that the previous intra-articular Marcaine/Depo-Medrol injection gave some relief for about a week but then pain returned.  Currently she is about 15% improved.  I previously discussed trying viscosupplementation but this is not covered by her insurance.  Patient states today that she would cover the out-of-pocket expense.  Exam Pleasant female alert and oriented in no acute stress.  Knee is diffusely tender more so at the joint line.  Positive patellofemoral crepitus.    Plan Patient was advised by our staff member who handes are Visco supplementation injections that she will need to pay for this online at the company.  We will schedule return office visit to start series after this is done.

## 2021-11-22 ENCOUNTER — Other Ambulatory Visit: Payer: Self-pay

## 2021-11-22 DIAGNOSIS — M1731 Unilateral post-traumatic osteoarthritis, right knee: Secondary | ICD-10-CM

## 2021-11-23 ENCOUNTER — Encounter: Payer: Self-pay | Admitting: Internal Medicine

## 2021-11-24 ENCOUNTER — Ambulatory Visit: Payer: Medicaid Other | Admitting: Surgery

## 2021-11-24 ENCOUNTER — Encounter: Payer: Self-pay | Admitting: Surgery

## 2021-11-24 VITALS — BP 195/97 | HR 91 | Ht 64.0 in | Wt 285.0 lb

## 2021-11-24 DIAGNOSIS — M1731 Unilateral post-traumatic osteoarthritis, right knee: Secondary | ICD-10-CM

## 2021-11-24 MED ORDER — SODIUM HYALURONATE (VISCOSUP) 25 MG/2.5ML IX SOSY
25.0000 mg | PREFILLED_SYRINGE | INTRA_ARTICULAR | Status: AC | PRN
  Administered 2021-11-24: 25 mg via INTRA_ARTICULAR

## 2021-11-24 MED ORDER — LIDOCAINE HCL 1 % IJ SOLN
3.0000 mL | INTRAMUSCULAR | Status: AC | PRN
  Administered 2021-11-24: 3 mL

## 2021-11-24 NOTE — Progress Notes (Signed)
Office Visit Note   Patient: Joanne Johns           Date of Birth: 12-Apr-1975           MRN: [id number] Visit Date: 11/24/2021              Requested by: Joanne Johns [address] [place],  [place] [address] PCP: Joanne Johns   Assessment & Plan: Visit Diagnoses:  1. Post-traumatic osteoarthritis of right knee     Plan: After patient consent right knee Visco injection #1 of 3 performed.  Tolerated without complication.  Follow-up in 1 week for injection #2.  Follow-Up Instructions: Return in about 1 week (around 12/01/2021) for with Joanne Johns for trivisc injection #2.   Orders:  Orders Placed This Encounter  Procedures   Large Joint Inj   No orders of the defined types were placed in this encounter.     Procedures: Large Joint Inj on 11/24/2021 9:00 AM Indications: pain Details: 25 G 1.5 in needle Medications: 3 mL lidocaine 1 %; 25 mg Sodium Hyaluronate (Viscosup) 25 MG/2.5ML Outcome: tolerated well, no immediate complications      Clinical Data: No additional findings.   Subjective: Chief Complaint  Patient presents with   Right Knee - Follow-up    HPI 46 year old black female with known history of DJD right knee comes in for Trivisc series.  States that Joanne Johns with previous intra-articular Marcaine/Depo-Medrol injection. Review of Systems No current complaints of cardiopulmonary GI/GU issues  Objective: Vital Signs: BP (!) 195/97   Pulse 91   Ht '5\' 4"'$  (1.626 m)   Wt 285 lb (129.3 kg)   BMI 48.92 kg/m   Physical Exam HENT:     Head: Normocephalic.  Eyes:     Extraocular Movements: Extraocular movements intact.  Musculoskeletal:     Comments: Positive patellofemoral crepitus.  Joint line tender.  Neurological:     Mental Status: Joanne Johns is alert.  Psychiatric:        Mood and Affect: Mood normal.     Ortho Exam  Specialty Comments:  No specialty comments available.  Imaging: No results  found.   PMFS History: Patient Active Problem List   Diagnosis Date Noted   Creatinine elevation 04/01/2021   Prediabetes 04/01/2021   Screening for heart disease 04/01/2021   BMI 45.0-49.9, adult ([place]) 04/01/2021   Routine general medical examination at a health care facility 02/07/2016   Essential hypertension 02/07/2016   Respiratory tract infection 02/07/2016   IUD (intrauterine device) in place 02/07/2016   Obesity 12/25/2014   Screen for STD (sexually transmitted disease) 12/25/2014   Screening for cervical cancer 12/25/2014   Need for prophylactic vaccination and inoculation against influenza 12/25/2014   Encounter for health maintenance examination in adult 12/25/2014   Sleep disturbance 12/25/2014   Pelvic pain in female 12/25/2014   Encounter for surveillance of contraceptive pills 02/05/2014   Impaired fasting blood sugar 02/05/2014   Past Medical History:  Diagnosis Date   Abnormal Pap smear 2005   paps normal 2007, 2008, 2009, 2010, 2013, 2014   Back pain    Chlamydia 2016   Herpes gingivostomatitis    History of EKG 02/09/09   normal   History of pregnancy    5 pregnancies, 2 live births, 3 TABs   HSV infection 2018   positive HSV I and II   Hypertension    Impaired fasting blood sugar 2015   Liver hemangioma 2008  per ultrasound and 02/2013 MRI abdomen   Obesity     Family History  Problem Relation Age of Onset   Cancer Maternal Uncle        leukemia   Tuberculosis Father    Diabetes Father    Hypertension Father    Heart attack Father    Anesthesia problems Neg Hx    Heart disease Neg Hx    Hyperlipidemia Neg Hx    Stroke Neg Hx     Past Surgical History:  Procedure Laterality Date   COLPOSCOPY  05/2003   INTRAUTERINE DEVICE (IUD) INSERTION  2017   Mirena   KNEE SURGERY  2000   R knee, ACL, MCL meniscus   Social History   Occupational History   Occupation: Chiropractor    Comment: Scientist, physiological  Tobacco Use   Smoking  status: Never   Smokeless tobacco: Never  Vaping Use   Vaping Use: Never used  Substance and Sexual Activity   Alcohol use: No   Drug use: No   Sexual activity: Yes    Partners: Male    Birth control/protection: I.U.D.

## 2021-11-25 ENCOUNTER — Other Ambulatory Visit: Payer: Self-pay | Admitting: Medical

## 2021-11-25 MED ORDER — AMLODIPINE BESYLATE 10 MG PO TABS: 10.0000 mg | ORAL_TABLET | Freq: Every day | ORAL | 1 refills | Status: DC

## 2021-11-26 ENCOUNTER — Other Ambulatory Visit: Payer: Self-pay

## 2021-11-26 ENCOUNTER — Encounter (HOSPITAL_BASED_OUTPATIENT_CLINIC_OR_DEPARTMENT_OTHER): Payer: Self-pay | Admitting: Emergency Medicine

## 2021-11-26 ENCOUNTER — Emergency Department (HOSPITAL_BASED_OUTPATIENT_CLINIC_OR_DEPARTMENT_OTHER): Payer: Medicaid Other

## 2021-11-26 ENCOUNTER — Emergency Department (HOSPITAL_BASED_OUTPATIENT_CLINIC_OR_DEPARTMENT_OTHER)
Admission: EM | Admit: 2021-11-26 | Discharge: 2021-11-26 | Disposition: A | Discharge status: Home / Self Care | Payer: Medicaid Other | Attending: Emergency Medicine | Admitting: Emergency Medicine

## 2021-11-26 DIAGNOSIS — I160 Hypertensive urgency: Secondary | ICD-10-CM | POA: Diagnosis not present

## 2021-11-26 DIAGNOSIS — Z20822 Contact with and (suspected) exposure to covid-19: Secondary | ICD-10-CM | POA: Diagnosis not present

## 2021-11-26 DIAGNOSIS — I10 Essential (primary) hypertension: Secondary | ICD-10-CM | POA: Diagnosis not present

## 2021-11-26 DIAGNOSIS — R0602 Shortness of breath: Secondary | ICD-10-CM | POA: Diagnosis not present

## 2021-11-26 DIAGNOSIS — Z87898 Personal history of other specified conditions: Secondary | ICD-10-CM | POA: Diagnosis not present

## 2021-11-26 DIAGNOSIS — R0600 Dyspnea, unspecified: Secondary | ICD-10-CM | POA: Diagnosis not present

## 2021-11-26 DIAGNOSIS — R911 Solitary pulmonary nodule: Secondary | ICD-10-CM | POA: Diagnosis not present

## 2021-11-26 LAB — CBC WITH DIFFERENTIAL/PLATELET
Abs Immature Granulocytes: 0.03 10*3/uL (ref 0.00–0.07)
Basophils Absolute: 0 10*3/uL (ref 0.0–0.1)
Basophils Relative: 1 %
Eosinophils Absolute: 0.1 10*3/uL (ref 0.0–0.5)
Eosinophils Relative: 1 %
HCT: 43.9 % (ref 36.0–46.0)
Hemoglobin: 14.5 g/dL (ref 12.0–15.0)
Immature Granulocytes: 0 %
Lymphocytes Relative: 27 %
Lymphs Abs: 2 10*3/uL (ref 0.7–4.0)
MCH: 30 pg (ref 26.0–34.0)
MCHC: 33 g/dL (ref 30.0–36.0)
MCV: 90.7 fL (ref 80.0–100.0)
Monocytes Absolute: 0.5 10*3/uL (ref 0.1–1.0)
Monocytes Relative: 6 %
Neutro Abs: 4.9 10*3/uL (ref 1.7–7.7)
Neutrophils Relative %: 65 %
Platelets: 300 10*3/uL (ref 150–400)
RBC: 4.84 MIL/uL (ref 3.87–5.11)
RDW: 13.2 % (ref 11.5–15.5)
WBC: 7.4 10*3/uL (ref 4.0–10.5)
nRBC: 0 % (ref 0.0–0.2)

## 2021-11-26 LAB — COMPREHENSIVE METABOLIC PANEL
ALT: 19 U/L (ref 0–44)
AST: 15 U/L (ref 15–41)
Albumin: 4.3 g/dL (ref 3.5–5.0)
Alkaline Phosphatase: 67 U/L (ref 38–126)
Anion gap: 8 (ref 5–15)
BUN: 12 mg/dL (ref 6–20)
CO2: 24 mmol/L (ref 22–32)
Calcium: 9.1 mg/dL (ref 8.9–10.3)
Chloride: 104 mmol/L (ref 98–111)
Creatinine, Ser: 1.05 mg/dL — ABNORMAL HIGH (ref 0.44–1.00)
GFR, Estimated: >60 mL/min (ref >60)
Glucose, Bld: 93 mg/dL (ref 70–99)
Potassium: 3.9 mmol/L (ref 3.5–5.1)
Sodium: 136 mmol/L (ref 135–145)
Total Bilirubin: 0.6 mg/dL (ref 0.3–1.2)
Total Protein: 7.7 g/dL (ref 6.5–8.1)

## 2021-11-26 LAB — TROPONIN I (HIGH SENSITIVITY)
Troponin I (High Sensitivity): 5 ng/L (ref <18)
Troponin I (High Sensitivity): 6 ng/L (ref <18)

## 2021-11-26 LAB — SARS CORONAVIRUS 2 BY RT PCR: SARS Coronavirus 2 by RT PCR: NEGATIVE

## 2021-11-26 LAB — BRAIN NATRIURETIC PEPTIDE: B Natriuretic Peptide: 32 pg/mL (ref 0.0–100.0)

## 2021-11-26 LAB — PREGNANCY, URINE: Preg Test, Ur: NEGATIVE

## 2021-11-26 LAB — D-DIMER, QUANTITATIVE: D-Dimer, Quant: 0.93 ug/mL-FEU — ABNORMAL HIGH (ref 0.00–0.50)

## 2021-11-26 MED ORDER — AMLODIPINE BESYLATE 5 MG PO TABS
10.0000 mg | ORAL_TABLET | Freq: Once | ORAL | Status: AC
  Administered 2021-11-26: 10 mg via ORAL
  Filled 2021-11-26: qty 2

## 2021-11-26 MED ORDER — SODIUM CHLORIDE 0.9 % IV BOLUS
1000.0000 mL | Freq: Once | INTRAVENOUS | Status: AC
Start: 2021-11-26 — End: 2021-11-26
  Administered 2021-11-26: 1000 mL via INTRAVENOUS

## 2021-11-26 MED ORDER — IOHEXOL 350 MG/ML SOLN
75.0000 mL | Freq: Once | INTRAVENOUS | Status: AC | PRN
  Administered 2021-11-26: 75 mL via INTRAVENOUS

## 2021-11-26 NOTE — ED Provider Notes (Signed)
[facility] EMERGENCY DEPARTMENT Provider Note   CSN: [id number] Arrival date & time: 11/26/21  1051     History  Chief Complaint  Patient presents with   Shortness of Breath    Joanne Johns is a 46 y.o. female.  HPI 46 year old female presents with shortness of breath and chest tightness.  She has been feeling palpitations which she describes as an increased heart rate but not an irregular heart rate for the past 1 week or so.  This morning since waking up at about 6:30 AM she has noticed a little bit of chest tightness that has been constant.  No fevers though it may be a little bit of cough over the last couple days.  No sore throat.  She also feels short of breath, especially on some exertion.  She denies a little bit of shortness of breath when laying down and first getting up in the morning.  No PND.  No new leg swelling.  No calf swelling.  She did recently take a trip to [place] less than 1 month ago.  She went to urgent care and due to an abnormal EKG they sent her here.   Home Medications Prior to Admission medications   Medication Sig Start Date End Date Taking? Authorizing Provider  amLODipine (NORVASC) 10 MG tablet TAKE 1 TABLET(10 MG) BY MOUTH DAILY 11/25/21   Tysinger, Camelia Eng, PA-C  HYDROcodone-acetaminophen (NORCO) 5-325 MG tablet Take 1 tablet by mouth 2 (two) times daily as needed. 10/05/21   Tysinger, Camelia Eng, PA-C  ibuprofen (ADVIL) 200 MG tablet Take 800 mg by mouth every 6 (six) hours as needed.    [provider]  levonorgestrel (MIRENA) 20 MCG/24HR IUD 1 each by Intrauterine route once.    [provider]  valACYclovir (VALTREX) 500 MG tablet TAKE 1 TABLET BY MOUTH TWICE DAILY FOR 3 DAYS AS NEEDED FOR OUTBREAK 08/29/21   Salvadore Dom, MD      Allergies    Patient has no known allergies.    Review of Systems   Review of Systems  Constitutional:  Negative for fever.  Respiratory:  Positive for cough, chest tightness and  shortness of breath.   Cardiovascular:  Positive for palpitations. Negative for chest pain and leg swelling.  Gastrointestinal:  Negative for abdominal pain and vomiting.    Physical Exam Updated Vital Signs BP (!) 197/103   Pulse 96   Temp 98.5 F (36.9 C) (Oral)   Resp 18   Ht '5\' 4"'$  (1.626 m)   Wt 130.2 kg   SpO2 100%   BMI 49.26 kg/m  Physical Exam Vitals and nursing note reviewed.  Constitutional:      General: She is not in acute distress.    Appearance: She is well-developed. She is not ill-appearing or diaphoretic.  HENT:     Head: Normocephalic and atraumatic.  Cardiovascular:     Rate and Rhythm: Regular rhythm. Tachycardia present.     Heart sounds: Normal heart sounds.     Comments: HR low 100s Pulmonary:     Effort: Pulmonary effort is normal.     Breath sounds: Normal breath sounds.  Abdominal:     Palpations: Abdomen is soft.     Tenderness: There is no abdominal tenderness.  Musculoskeletal:     Comments: Perhaps some mild ankle swelling bilaterally - however to patient this is unchanged from baseline  Skin:    General: Skin is warm and dry.  Neurological:  Mental Status: She is alert.     ED Results / Procedures / Treatments   Labs (all labs ordered are listed, but only abnormal results are displayed) Labs Reviewed  COMPREHENSIVE METABOLIC PANEL - Abnormal; Notable for the following components:      Result Value   Creatinine, Ser 1.05 (*)    All other components within normal limits  D-DIMER, QUANTITATIVE - Abnormal; Notable for the following components:   D-Dimer, Quant 0.93 (*)    All other components within normal limits  SARS CORONAVIRUS 2 BY RT PCR  BRAIN NATRIURETIC PEPTIDE  CBC WITH DIFFERENTIAL/PLATELET  PREGNANCY, URINE  TROPONIN I (HIGH SENSITIVITY)  TROPONIN I (HIGH SENSITIVITY)    EKG EKG Interpretation  Date/Time:  Saturday November 26 2021 11:00:07 EDT Ventricular Rate:  109 PR Interval:  149 QRS Duration: 74 QT  Interval:  307 QTC Calculation: 414 R Axis:   79 Text Interpretation: Sinus tachycardia Repol abnrm suggests ischemia, diffuse leads T wave changes similar to 2018 Confirmed by Sherwood Gambler [phone]) on 11/26/2021 11:06:04 AM  Radiology CT Angio Chest PE W and/or Wo Contrast  Result Date: 11/26/2021 CLINICAL DATA:  Shortness of breath and left-sided chest tightness for 1 week ago EXAM: CT ANGIOGRAPHY CHEST WITH CONTRAST TECHNIQUE: Multidetector CT imaging of the chest was performed using the standard protocol during bolus administration of intravenous contrast. Multiplanar CT image reconstructions and MIPs were obtained to evaluate the vascular anatomy. RADIATION DOSE REDUCTION: This exam was performed according to the departmental dose-optimization program which includes automated exposure control, adjustment of the mA and/or kV according to patient size and/or use of iterative reconstruction technique. CONTRAST:  29m OMNIPAQUE IOHEXOL 350 MG/ML SOLN COMPARISON:  None Available. FINDINGS: Cardiovascular: The heart size is normal. No substantial pericardial effusion. No thoracic aortic aneurysm. There is no filling defect within the opacified pulmonary arteries to suggest the presence of an acute pulmonary embolus. Mediastinum/Nodes: No mediastinal lymphadenopathy. There is no hilar lymphadenopathy. The esophagus has normal imaging features. There is no axillary lymphadenopathy. Lungs/Pleura: 4 mm right lower lobe pulmonary nodule identified on image 73/series 5. No overtly suspicious pulmonary nodule or mass. No focal airspace consolidation. There is no evidence of pleural effusion. Upper Abdomen: Unremarkable. Musculoskeletal: No worrisome lytic or sclerotic osseous abnormality. Review of the MIP images confirms the above findings. IMPRESSION: 1. No CT evidence for acute pulmonary embolus. 2. 4 mm right lower lobe pulmonary nodule. No follow-up needed if patient is low-risk.This recommendation follows the  consensus statement: Guidelines for Management of Incidental Pulmonary Nodules Detected on CT Images: From the Fleischner Society 2017; Radiology 2017; 284:228-243. Electronically Signed   By: EMisty StanleyM.D.   On: 11/26/2021 12:41   DG Chest 2 View  Result Date: 11/26/2021 CLINICAL DATA:  Dyspnea. EXAM: CHEST - 2 VIEW COMPARISON:  None. FINDINGS: The lungs are clear without focal pneumonia, edema, pneumothorax or pleural effusion. The cardiopericardial silhouette is within normal limits for size. The visualized bony structures of the thorax are unremarkable. Telemetry leads overlie the chest. IMPRESSION: No active cardiopulmonary disease. Electronically Signed   By: EMisty StanleyM.D.   On: 11/26/2021 11:34    Procedures Procedures    Medications Ordered in ED Medications  iohexol (OMNIPAQUE) 350 MG/ML injection 75 mL (75 mLs Intravenous Contrast Given 11/26/21 1222)  sodium chloride 0.9 % bolus 1,000 mL (0 mLs Intravenous Stopped 11/26/21 1357)  amLODipine (NORVASC) tablet 10 mg (10 mg Oral Given 11/26/21 1318)    ED Course/ Medical Decision Making/  A&P                           Medical Decision Making Amount and/or Complexity of Data Reviewed Labs: ordered.    Details: Troponin negative x2.  Normal BNP.  D-dimer is elevated at 0.9. Radiology: ordered and independent interpretation performed.    Details: Chest x-ray without CHF.  CTA without PE. ECG/medicine tests: independent interpretation performed.    Details: No acute ischemia, T wave changes are similar to 5 years ago.  Risk Prescription drug management.   After initial work-up was pretty unremarkable including negative CTA, I discussed patient and her blood pressure.  She states she has not taken her Norvasc 10 mg this morning.  She also tells me she has been out of her blood pressure medicine the whole time she has been feeling bad for this past week.  She took 1 dose yesterday.  Her blood pressure has consistently been in the  010-272 systolic and occasionally up to 536 diastolic.  I suspect that hypertensive urgency is causing the symptoms.  However given that there is no signs of stroke, ACS, CHF or renal failure, I think that this is not a hypertensive emergency.  She was given her home dose of Norvasc as she has not yet taken it today.  I think she is otherwise stable and is stable for discharge home to follow-up closely with PCP for better outpatient blood pressure control. At this point she's asymptomatic.  Chart review shows that just a month and a half ago she had normal blood pressures at PCP visits which suggest that if she can stay on her blood pressure meds as instructed, her BP would be better controlled and so I do not think adding new medicines is warranted.  Of note, she has never been a smoker and I think the small lung nodule is not pathologic and per radiology does not need follow-up.  I did discuss this with her however and she can follow with her PCP.          Final Clinical Impression(s) / ED Diagnoses Final diagnoses:  Hypertensive urgency    Rx / DC Orders ED Discharge Orders     None         Sherwood Gambler, MD 11/26/21 1453

## 2021-11-26 NOTE — ED Notes (Signed)
2nd Troponin Level obtained and to the lab

## 2021-11-26 NOTE — Discharge Instructions (Addendum)
Keep a record of your blood pressure measurements at home so he can show your doctor.  Be sure you are taking your amlodipine correctly and every day.  If you develop recurrent, continued, or worsening chest pain, shortness of breath, fever, vomiting, abdominal or back pain, or any other new/concerning symptoms then return to the ER for evaluation.

## 2021-11-26 NOTE — ED Triage Notes (Signed)
Pt arrives pov, reports "heart beating fast", and shob x 1 week, referred by UC. Endorses LT side chest tightness, denies  CP. PT eval by RT [facility]

## 2021-11-26 NOTE — Progress Notes (Signed)
Pt taken from waiting room to ED room 7. Upon arrival to room, SpO2 96% with ambulation. Clear bilateral breath sounds, pt able to speak in complete sentences and no distress noted. RT will continue to be available as needed.

## 2021-11-26 NOTE — ED Notes (Signed)
Presents with shortness of breath, states at night has noted an increase in shortness of breath when attempting to lie down. Has also had some chest pressure, Urgent Care visit prior to here where ECG was performed and sent for further evaluation of ECG.

## 2021-11-26 NOTE — ED Notes (Signed)
IV removed.

## 2021-11-28 ENCOUNTER — Telehealth: Payer: Self-pay | Admitting: Medical

## 2021-11-28 NOTE — Telephone Encounter (Signed)
Transition Care Management Unsuccessful Follow-up Telephone Call  Date of discharge and from where:  11/26/2021 [facility]  Attempts:  1st Attempt  Reason for unsuccessful TCM follow-up call:  Left voice message

## 2021-11-29 NOTE — Telephone Encounter (Signed)
Transition Care Management Follow-up Telephone Call Date of discharge and from where: 11/26/2021 [facility]  How have you been since you were released from the hospital? Better  Any questions or concerns? No  Items Reviewed: Did the pt receive and understand the discharge instructions provided? Yes  Medications obtained and verified? Yes  Other? Yes pt states she is keeping up with her bp readings  Any new allergies since your discharge? No  Dietary orders reviewed? No Do you have support at home? Yes   Home Care and Equipment/Supplies: Were home health services ordered? no Follow up appointments reviewed:  PCP Hospital f/u appt confirmed? Yes  Scheduled to see Audelia Acton  on 12/05/2021 @ 3:15. Pt was advised that Audelia Acton is out of office and she was to see him with in 2 days. Pt was offered an appt with another provider and she declined stating she only wanted to see Audelia Acton. Pt was placed on his schedule at next available appt.  Are transportation arrangements needed? No  If their condition worsens, is the pt aware to call PCP or go to the Emergency Dept.? Yes Was the patient provided with contact information for the PCP's office or ED? Yes Was to pt encouraged to call back with questions or concerns? Yes

## 2021-12-01 ENCOUNTER — Encounter: Payer: Self-pay | Admitting: Surgery

## 2021-12-01 ENCOUNTER — Ambulatory Visit: Payer: Medicaid Other | Admitting: Surgery

## 2021-12-01 DIAGNOSIS — M1731 Unilateral post-traumatic osteoarthritis, right knee: Secondary | ICD-10-CM | POA: Diagnosis not present

## 2021-12-01 DIAGNOSIS — M7051 Other bursitis of knee, right knee: Secondary | ICD-10-CM

## 2021-12-01 MED ORDER — SODIUM HYALURONATE (VISCOSUP) 25 MG/2.5ML IX SOSY
25.0000 mg | PREFILLED_SYRINGE | INTRA_ARTICULAR | Status: AC | PRN
  Administered 2021-12-01: 25 mg via INTRA_ARTICULAR

## 2021-12-01 MED ORDER — BUPIVACAINE HCL 0.5 % IJ SOLN
2.0000 mL | INTRAMUSCULAR | Status: AC | PRN
  Administered 2021-12-01: 2 mL via INTRA_ARTICULAR

## 2021-12-01 MED ORDER — METHYLPREDNISOLONE ACETATE 40 MG/ML IJ SUSP
40.0000 mg | INTRAMUSCULAR | Status: AC | PRN
  Administered 2021-12-01: 40 mg via INTRA_ARTICULAR

## 2021-12-01 MED ORDER — LIDOCAINE HCL 1 % IJ SOLN
3.0000 mL | INTRAMUSCULAR | Status: AC | PRN
  Administered 2021-12-01: 3 mL

## 2021-12-01 NOTE — Progress Notes (Signed)
Office Visit Note   Patient: Joanne Johns           Date of Birth: 06-02-1975           MRN: [id number] Visit Date: 12/01/2021              Requested by: Carlena Hurl, PA-C [address] [place],  [place] [address] PCP: Carlena Hurl, PA-C   Assessment & Plan: Visit Diagnoses:  1. Post-traumatic osteoarthritis of right knee   2. Pes anserinus bursitis of right knee     Plan: After patient consent right knee intra-articular Trivisc injection #2 of 3 performed from an anteromedial approach.  Advised patient that I think that her acute pain from this morning is secondary to pes bursitis.  I did offer conservative treatment with injection.  After patient consent pes bursa Marcaine/Depo-Medrol injection was performed.  After sitting for few minutes patient reported complete relief at that specific area of pain.  Follow-up with me in 1 week for third and final Visco injection.  Follow-Up Instructions: Return in about 1 week (around 12/08/2021) for With Jeneen Rinks in 1 week for final Visco injection right knee.   Orders:  Orders Placed This Encounter  Procedures   Large Joint Inj   No orders of the defined types were placed in this encounter.     Procedures: Large Joint Inj: R knee on 12/01/2021 9:33 AM Indications: pain and joint swelling Details: 25 G 1.5 in needle, anteromedial approach Medications: 3 mL lidocaine 1 %; 40 mg methylPREDNISolone acetate 40 MG/ML; 25 mg Sodium Hyaluronate (Viscosup) 25 MG/2.5ML; 2 mL bupivacaine 0.5 % Outcome: tolerated well, no immediate complications  1) right knee intra-articular Trivisc injection #2 of 3 performed  2) right knee pes bursa Marcaine/Depo-Medrol 2: 1 injection performed Consent was given by the patient. Patient was prepped and draped in the usual sterile fashion.       Clinical Data: No additional findings.   Subjective: Chief Complaint  Patient presents with   Right Knee - Pain    HPI 46 year old black  female with known history of end-stage DJD comes in for Visco injection #2 of 3.  States that she has been doing reasonably well up until about 4 AM this morning when she woke up with marked discomfort in her knee.  Painful when she ambulated.  Localizes pain to the pes bursa area.  Denies injury.     Objective: Vital Signs: There were no vitals taken for this visit.  Physical Exam Pleasant female alert and oriented in no acute distress.  Gait is antalgic.  Has some medial and lateral joint line tenderness.  Moderate to marked tenderness over the pes bursa and there is also some swelling.  No signs of infection. Ortho Exam  Specialty Comments:  No specialty comments available.  Imaging: No results found.   PMFS History: Patient Active Problem List   Diagnosis Date Noted   Creatinine elevation 04/01/2021   Prediabetes 04/01/2021   Screening for heart disease 04/01/2021   BMI 45.0-49.9, adult ([place]) 04/01/2021   Routine general medical examination at a health care facility 02/07/2016   Essential hypertension 02/07/2016   Respiratory tract infection 02/07/2016   IUD (intrauterine device) in place 02/07/2016   Obesity 12/25/2014   Screen for STD (sexually transmitted disease) 12/25/2014   Screening for cervical cancer 12/25/2014   Need for prophylactic vaccination and inoculation against influenza 12/25/2014   Encounter for health maintenance examination in adult 12/25/2014   Sleep  disturbance 12/25/2014   Pelvic pain in female 12/25/2014   Encounter for surveillance of contraceptive pills 02/05/2014   Impaired fasting blood sugar 02/05/2014   Past Medical History:  Diagnosis Date   Abnormal Pap smear 2005   paps normal 2007, 2008, 2009, 2010, 2013, 2014   Back pain    Chlamydia 2016   Herpes gingivostomatitis    History of EKG 02/09/09   normal   History of pregnancy    5 pregnancies, 2 live births, 3 TABs   HSV infection 2018   positive HSV I and II   Hypertension     Impaired fasting blood sugar 2015   Liver hemangioma 2008   per ultrasound and 02/2013 MRI abdomen   Obesity     Family History  Problem Relation Age of Onset   Cancer Maternal Uncle        leukemia   Tuberculosis Father    Diabetes Father    Hypertension Father    Heart attack Father    Anesthesia problems Neg Hx    Heart disease Neg Hx    Hyperlipidemia Neg Hx    Stroke Neg Hx     Past Surgical History:  Procedure Laterality Date   COLPOSCOPY  05/2003   INTRAUTERINE DEVICE (IUD) INSERTION  2017   Mirena   KNEE SURGERY  2000   R knee, ACL, MCL meniscus   Social History   Occupational History   Occupation: Chiropractor    Comment: Scientist, physiological  Tobacco Use   Smoking status: Never   Smokeless tobacco: Never  Vaping Use   Vaping Use: Never used  Substance and Sexual Activity   Alcohol use: Yes    Comment: occ   Drug use: No   Sexual activity: Yes    Partners: Male    Birth control/protection: I.U.D.

## 2021-12-05 ENCOUNTER — Other Ambulatory Visit: Payer: Self-pay | Admitting: Medical

## 2021-12-05 ENCOUNTER — Ambulatory Visit: Payer: Medicaid Other | Admitting: Medical

## 2021-12-05 VITALS — BP 160/112 | HR 92 | Wt 285.6 lb

## 2021-12-05 DIAGNOSIS — R9431 Abnormal electrocardiogram [ECG] [EKG]: Secondary | ICD-10-CM

## 2021-12-05 DIAGNOSIS — I10 Essential (primary) hypertension: Secondary | ICD-10-CM

## 2021-12-05 MED ORDER — VALSARTAN-HYDROCHLOROTHIAZIDE 80-12.5 MG PO TABS
1.0000 | ORAL_TABLET | Freq: Every day | ORAL | 0 refills | Status: DC
Start: 2021-12-05 — End: 2022-01-02

## 2021-12-05 MED ORDER — VALSARTAN-HYDROCHLOROTHIAZIDE 80-12.5 MG PO TABS: 1.0000 | ORAL_TABLET | Freq: Every day | ORAL | 0 refills | Status: DC

## 2021-12-05 NOTE — Progress Notes (Signed)
Subjective:  Joanne Johns is a 46 y.o. female who presents for Chief Complaint  Patient presents with   ER follow-up    ER follow-up, high bp. Slight high.      Here for urgent care f/u.  Woke up Saturday 1 week ago, felt heart beating  fast, beating out of her chest.  Went to urgent care.  Was advised to go to the ED.  She went home and a little later that day went to ED at [facility].  Had CT chest, chest xray, labs, EKG.  Was advised to f/u with PCP.  She notes no current symptoms other than BPs running high.  Has not had any more elevated pulse rate since ED visit.    No current chest pain, SOB, DOE, edema, palpitations.  No prior sleep study.    Compliant with amlodipine.  No other aggravating or relieving factors.    No other c/o.  Past Medical History:  Diagnosis Date   Abnormal Pap smear 2005   paps normal 2007, 2008, 2009, 2010, 2013, 2014   Back pain    Chlamydia 2016   Herpes gingivostomatitis    History of EKG 02/09/09   normal   History of pregnancy    5 pregnancies, 2 live births, 3 TABs   HSV infection 2018   positive HSV I and II   Hypertension    Impaired fasting blood sugar 2015   Liver hemangioma 2008   per ultrasound and 02/2013 MRI abdomen   Obesity    Current Outpatient Medications on File Prior to Visit  Medication Sig Dispense Refill   amLODipine (NORVASC) 10 MG tablet TAKE 1 TABLET(10 MG) BY MOUTH DAILY 90 tablet 0   levonorgestrel (MIRENA) 20 MCG/24HR IUD 1 each by Intrauterine route once.     valACYclovir (VALTREX) 500 MG tablet TAKE 1 TABLET BY MOUTH TWICE DAILY FOR 3 DAYS AS NEEDED FOR OUTBREAK 30 tablet 1   No current facility-administered medications on file prior to visit.     The following portions of the patient's history were reviewed and updated as appropriate: allergies, current medications, past family history, past medical history, past social history, past surgical history and problem list.  ROS Otherwise as in subjective  above   Objective: BP (!) 160/112   Pulse 92   Wt 285 lb 9.3 oz (129.5 kg)   BMI 49.02 kg/m   General appearance: alert, no distress, well developed, well nourished HEENT: normocephalic, sclerae anicteric, conjunctiva pink and moist, TMs pearly, nares patent, no discharge or erythema, pharynx normal Oral cavity: MMM, no lesions Neck: supple, no lymphadenopathy, no thyromegaly, no masses, no bruits Heart: RRR, normal S1, S2, no murmurs Lungs: CTA bilaterally, no wheezes, rhonchi, or rales Abdomen: +bs, soft, non tender, non distended, no masses, no hepatomegaly, no splenomegaly, on bruits Pulses: 2+ radial pulses, 2+ pedal pulses, normal cap refill Ext: no edema   Assessment: Encounter Diagnoses  Name Primary?   Essential hypertension Yes   Abnormal EKG      Plan: I reviewed her recent emergency dept visit notes, EKG, CT chest, labs.  D-dimer elevated, EKG showed new T wave inversions.  Continue Amlodipine, add Valsartan HCT, referral urgent to cardiology  Her pressure have been stable until this recent elevated readings without obvious trigger.   Given the new EKG changes, concern for other causes and contributing factors.  Will likely need renal artery imaging and renal imaging given prior elevated creatinine , possible sleep study, possible Echo.  If any new symptoms in the meantime, go to ED/call 911.   [name] was seen today for er follow-up.  Diagnoses and all orders for this visit:  Essential hypertension -     Ambulatory referral to Cardiology  Abnormal EKG -     Ambulatory referral to Cardiology  Other orders -     Discontinue: valsartan-hydrochlorothiazide (DIOVAN-HCT) 80-12.5 MG tablet; Take 1 tablet by mouth daily. -     valsartan-hydrochlorothiazide (DIOVAN-HCT) 80-12.5 MG tablet; Take 1 tablet by mouth daily.    Follow up: pending referral

## 2021-12-08 ENCOUNTER — Encounter: Payer: Self-pay | Admitting: Surgery

## 2021-12-08 ENCOUNTER — Ambulatory Visit: Payer: Medicaid Other | Admitting: Surgery

## 2021-12-08 ENCOUNTER — Telehealth: Payer: Self-pay | Admitting: Medical

## 2021-12-08 VITALS — BP 175/118 | HR 85 | Wt 285.0 lb

## 2021-12-08 DIAGNOSIS — M1711 Unilateral primary osteoarthritis, right knee: Secondary | ICD-10-CM | POA: Diagnosis not present

## 2021-12-08 MED ORDER — SODIUM HYALURONATE (VISCOSUP) 25 MG/2.5ML IX SOSY
25.0000 mg | PREFILLED_SYRINGE | INTRA_ARTICULAR | Status: AC | PRN
  Administered 2021-12-08: 25 mg via INTRA_ARTICULAR

## 2021-12-08 MED ORDER — LIDOCAINE HCL 1 % IJ SOLN
3.0000 mL | INTRAMUSCULAR | Status: AC | PRN
  Administered 2021-12-08: 3 mL

## 2021-12-08 NOTE — Telephone Encounter (Signed)
I saw her orthopedic note where her blood pressure was elevated today.  When is her cardiology visit from where we referred?  Make sure she is taking the amlodipine and the new medicine valsartan HCT

## 2021-12-08 NOTE — Progress Notes (Signed)
Office Visit Note   Patient: Joanne Johns           Date of Birth: 18-Oct-1975           MRN: [id number] Visit Date: 12/08/2021              Requested by: Carlena Hurl, PA-C [address] [place],  [place] [address] PCP: Carlena Hurl, PA-C   Assessment & Plan: Visit Diagnoses:  1. Unilateral primary osteoarthritis, right knee     Plan: Today after patient consent third and final Visco injection performed to the right knee.  Tolerated without complication.  Follow-up me in 2 months for recheck.  Again patient stands that ultimately may come down her needing definitive treatment with total knee replacement but she is wanting to exhaust all conservative measures before going that route.  Follow-Up Instructions: Return in about 2 months (around 02/07/2022) for with Chananya Canizalez recheck right knee.   Orders:  No orders of the defined types were placed in this encounter.  No orders of the defined types were placed in this encounter.     Procedures: Large Joint Inj: R knee on 12/08/2021 8:50 AM Indications: pain Details: 25 G 1.5 in needle, anteromedial approach Medications: 3 mL lidocaine 1 %; 25 mg Sodium Hyaluronate (Viscosup) 25 MG/2.5ML Consent was given by the patient. Patient was prepped and draped in the usual sterile fashion.       Clinical Data: No additional findings.   Subjective: Chief Complaint  Patient presents with   Right Knee - Follow-up    HPI 46 year old black female with known history of DJD right knee comes in for her third and final trivisc injection.  States that she has had some improvement at this point.  No complaints of adverse reaction.    Objective: Vital Signs: Wt 285 lb (129.3 kg)   BMI 48.92 kg/m   Physical Exam Pleasant female alert and oriented in no acute distress.  Knee is less tender today.  Positive crepitus. Ortho Exam  Specialty Comments:  No specialty comments available.  Imaging: No results found.   PMFS  History: Patient Active Problem List   Diagnosis Date Noted   Creatinine elevation 04/01/2021   Prediabetes 04/01/2021   Screening for heart disease 04/01/2021   BMI 45.0-49.9, adult ([place]) 04/01/2021   Routine general medical examination at a health care facility 02/07/2016   Essential hypertension 02/07/2016   Respiratory tract infection 02/07/2016   IUD (intrauterine device) in place 02/07/2016   Obesity 12/25/2014   Screen for STD (sexually transmitted disease) 12/25/2014   Screening for cervical cancer 12/25/2014   Need for prophylactic vaccination and inoculation against influenza 12/25/2014   Encounter for health maintenance examination in adult 12/25/2014   Sleep disturbance 12/25/2014   Pelvic pain in female 12/25/2014   Encounter for surveillance of contraceptive pills 02/05/2014   Impaired fasting blood sugar 02/05/2014   Past Medical History:  Diagnosis Date   Abnormal Pap smear 2005   paps normal 2007, 2008, 2009, 2010, 2013, 2014   Back pain    Chlamydia 2016   Herpes gingivostomatitis    History of EKG 02/09/09   normal   History of pregnancy    5 pregnancies, 2 live births, 3 TABs   HSV infection 2018   positive HSV I and II   Hypertension    Impaired fasting blood sugar 2015   Liver hemangioma 2008   per ultrasound and 02/2013 MRI abdomen   Obesity  Family History  Problem Relation Age of Onset   Cancer Maternal Uncle        leukemia   Tuberculosis Father    Diabetes Father    Hypertension Father    Heart attack Father    Anesthesia problems Neg Hx    Heart disease Neg Hx    Hyperlipidemia Neg Hx    Stroke Neg Hx     Past Surgical History:  Procedure Laterality Date   COLPOSCOPY  05/2003   INTRAUTERINE DEVICE (IUD) INSERTION  2017   Mirena   KNEE SURGERY  2000   R knee, ACL, MCL meniscus   Social History   Occupational History   Occupation: Chiropractor    Comment: Scientist, physiological  Tobacco Use   Smoking status: Never    Smokeless tobacco: Never  Vaping Use   Vaping Use: Never used  Substance and Sexual Activity   Alcohol use: Yes    Comment: occ   Drug use: No   Sexual activity: Yes    Partners: Male    Birth control/protection: I.U.D.

## 2021-12-09 NOTE — Telephone Encounter (Signed)
Patient has an appt with cardiology on Monday 9/25. She has been taking BP at home and running around 133/83. She picked up her new med on Wednesday as they had to order it

## 2021-12-12 ENCOUNTER — Encounter: Payer: Self-pay | Admitting: Interventional Cardiology

## 2021-12-12 ENCOUNTER — Ambulatory Visit: Payer: Medicaid Other | Attending: Interventional Cardiology | Admitting: Interventional Cardiology

## 2021-12-12 VITALS — BP 149/90 | HR 90 | Ht 64.0 in | Wt 280.4 lb

## 2021-12-12 DIAGNOSIS — I10 Essential (primary) hypertension: Secondary | ICD-10-CM | POA: Diagnosis not present

## 2021-12-12 DIAGNOSIS — R9431 Abnormal electrocardiogram [ECG] [EKG]: Secondary | ICD-10-CM

## 2021-12-12 DIAGNOSIS — R7303 Prediabetes: Secondary | ICD-10-CM | POA: Diagnosis not present

## 2021-12-12 NOTE — Progress Notes (Signed)
Cardiology Office Note   Date:  12/12/2021   ID:  Joanne Johns, DOB 20-Mar-1976, MRN [id number]  PCP:  Carlena Hurl, PA-C    No chief complaint on file.  HTN  Wt Readings from Last 3 Encounters:  12/12/21 280 lb 6.4 oz (127.2 kg)  12/08/21 285 lb (129.3 kg)  12/05/21 285 lb 9.3 oz (129.5 kg)       History of Present Illness: Joanne Johns is a 46 y.o. female who is being seen today for the evaluation of hypertension at the request of Carlena Hurl, PA-C.   She went to the urgent care/ER in September 2023.: "with shortness of breath and chest tightness.  She has been feeling palpitations which she describes as an increased heart rate but not an irregular heart rate for the past 1 week or so.  This morning since waking up at about 6:30 AM she has noticed a little bit of chest tightness that has been constant.  No fevers though it may be a little bit of cough over the last couple days.  No sore throat.  She also feels short of breath, especially on some exertion.  She denies a little bit of shortness of breath when laying down and first getting up in the morning.  No PND.  No new leg swelling.  No calf swelling.  She did recently take a trip to [place] less than 1 month ago.  She went to urgent care and due to an abnormal EKG they sent her here."  CT showed: "Cardiovascular: The heart size is normal. No substantial pericardial effusion. No thoracic aortic aneurysm. There is no filling defect within the opacified pulmonary arteries to suggest the presence of an acute pulmonary embolus."  Woke up Saturday 1 week ago, felt heart beating  fast, beating out of her chest.  Went to urgent care.  Was advised to go to the ED.  She went home and a little later that day went to ED at [facility].  Had CT chest, chest xray, labs, EKG.  Was advised to f/u with PCP.   She notes no current symptoms other than BPs running high.  Has not had any more elevated pulse rate since ED visit.      No current chest pain, SOB, DOE, edema, palpitations.  No prior sleep study.     Compliant with amlodipine.   No other aggravating or relieving factors.     No other c/o."  He added valsartan.  Some days, BP has been improved. In the past, she had headaches with high BP.  No recent headaches.    Of note, she was not taking her BP meds prior to the ER visit.   Due to chronic knee pain.    Denies : Chest pain. Dizziness. Leg edema. Nitroglycerin use. Orthopnea. Palpitations. Paroxysmal nocturnal dyspnea. Shortness of breath. Syncope.       Past Medical History:  Diagnosis Date   Abnormal Pap smear 2005   paps normal 2007, 2008, 2009, 2010, 2013, 2014   Back pain    Chlamydia 2016   Herpes gingivostomatitis    History of EKG 02/09/09   normal   History of pregnancy    5 pregnancies, 2 live births, 3 TABs   HSV infection 2018   positive HSV I and II   Hypertension    Impaired fasting blood sugar 2015   Liver hemangioma 2008   per ultrasound and 02/2013 MRI abdomen   Obesity  Past Surgical History:  Procedure Laterality Date   COLPOSCOPY  05/2003   INTRAUTERINE DEVICE (IUD) INSERTION  2017   Mirena   KNEE SURGERY  2000   R knee, ACL, MCL meniscus     Current Outpatient Medications  Medication Sig Dispense Refill   amLODipine (NORVASC) 10 MG tablet TAKE 1 TABLET(10 MG) BY MOUTH DAILY 90 tablet 0   levonorgestrel (MIRENA) 20 MCG/24HR IUD 1 each by Intrauterine route once.     valACYclovir (VALTREX) 500 MG tablet TAKE 1 TABLET BY MOUTH TWICE DAILY FOR 3 DAYS AS NEEDED FOR OUTBREAK 30 tablet 1   valsartan-hydrochlorothiazide (DIOVAN-HCT) 80-12.5 MG tablet Take 1 tablet by mouth daily. 30 tablet 0   ibuprofen (ADVIL) 200 MG tablet Take 4 tablets by mouth every 6 (six) hours as needed. (Patient not taking: Reported on 12/12/2021)     TRIVISC 25 MG/2.5ML SOSY INJECT INTO THE KNEE ONCE WEEKLY (Patient not taking: Reported on 12/12/2021)     No current  facility-administered medications for this visit.    Allergies:   Patient has no known allergies.    Social History:  The patient  reports that she has never smoked. She has never used smokeless tobacco. She reports current alcohol use. She reports that she does not use drugs.   Family History:  The patient's family history includes Cancer in her maternal uncle; Diabetes in her father; Heart attack in her father; Hypertension in her father; Tuberculosis in her father.    ROS:  Please see the history of present illness.   Otherwise, review of systems are positive for improved control of BP on meds.   All other systems are reviewed and negative.    PHYSICAL EXAM: VS:  BP (!) 149/90   Pulse 90   Ht '5\' 4"'$  (1.626 m)   Wt 280 lb 6.4 oz (127.2 kg)   SpO2 99%   BMI 48.13 kg/m  , BMI Body mass index is 48.13 kg/m. GEN: Well nourished, well developed, in no acute distress HEENT: normal Neck: no JVD, carotid bruits, or masses Cardiac: RRR; no murmurs, rubs, or gallops,no edema  Respiratory:  clear to auscultation bilaterally, normal work of breathing GI: soft, nontender, nondistended, + BS MS: no deformity or atrophy Skin: warm and dry, no rash Neuro:  Strength and sensation are intact Psych: euthymic mood, full affect   EKG:   The ekg ordered in the ER visit demonstrates sinus tach, diffuse TWI   Recent Labs: 03/24/2021: TSH 1.56 11/26/2021: ALT 19; B Natriuretic Peptide 32.0; BUN 12; Creatinine, Ser 1.05; Hemoglobin 14.5; Platelets 300; Potassium 3.9; Sodium 136   Lipid Panel    Component Value Date/Time   CHOL 212 (H) 03/24/2021 0928   CHOL 195 03/03/2020 0927   TRIG 73 03/24/2021 0928   HDL 87 03/24/2021 0928   HDL 84 03/03/2020 0927   CHOLHDL 2.4 03/24/2021 0928   VLDL 13 02/14/2016 0844   LDLCALC 109 (H) 03/24/2021 0928     Other studies Reviewed: Additional studies/ records that were reviewed today with results demonstrating: Total cholesterol 195 LDL 110 HDL 87  triglycerides 73.  Troponin in September 2023 was normal.   ASSESSMENT AND PLAN:  HTN: Significant room for improvement of lifestyle.  She does eat a fair amount of fast food.  Exercise is very limited.  Blood pressure is improved on medications.  She is not having the big spikes in her blood pressure.  No further chest discomfort.  No calcium noted in her cardiovascular  system on CT which is reassuring.  For now, continue medications.  We spoke at length about low-salt diet, avoiding processed foods.  Try to decrease processed meats and make better choices that include more fiber.  She needs to try to drink more water.  Increasing activity that will not exacerbate her knee pain.  Consider water aerobics. Abnormal ECG: T wave inversions noted diffusely.  Plan for echocardiogram to evaluate for structural heart disease. PreDM: A1C 5.8.  Increase diet and exercise.  Morbid obesity will also improve with these lifestyle changes. Stress management, she has felt more anxious of late.  We talked about mindfulness based stress reduction.  In general, we talked about managing this with lifestyle changes rather than medication.  She will also follow-up with her primary care doctor. I offered her a referral to our hypertension clinic with Dr. Oval Linsey.  She prefers to try to make some changes on her own.  She will follow-up with [place] in 6 months.  We have set a goal for weight loss of 15 to 20 pounds in that time.  Hopefully, she will need increased exercise and improved her diet significantly as well.  Her goal in general is to manage more of these conditions with lifestyle rather than medications.   Current medicines are reviewed at length with the patient today.  The patient concerns regarding her medicines were addressed.  The following changes have been made:  No change  Labs/ tests ordered today include:  No orders of the defined types were placed in this encounter.   Recommend 150 minutes/week of  aerobic exercise Low fat, low carb, high fiber diet recommended  Disposition:   FU in 6 months   Signed, Larae Grooms, MD  12/12/2021 9:55 AM    [facility] [place], [place], [place]  [address] Phone: [phone]; Fax: [phone]

## 2021-12-12 NOTE — Patient Instructions (Addendum)
Medication Instructions:  Your physician recommends that you continue on your current medications as directed. Please refer to the Current Medication list given to you today.  *If you need a refill on your cardiac medications before your next appointment, please call your pharmacy*   Lab Work: none If you have labs (blood work) drawn today and your tests are completely normal, you will receive your results only by: [place] (if you have MyChart) OR A paper copy in the mail If you have any lab test that is abnormal or we need to change your treatment, we will call you to review the results.   Testing/Procedures: Your physician has requested that you have an echocardiogram. Echocardiography is a painless test that uses sound waves to create images of your heart. It provides your doctor with information about the size and shape of your heart and how well your heart's chambers and valves are working. This procedure takes approximately one hour. There are no restrictions for this procedure.    Follow-Up: At [facility], you and your health needs are our priority.  As part of our continuing mission to provide you with exceptional heart care, we have created designated Provider Care Teams.  These Care Teams include your primary Cardiologist (physician) and Advanced Practice Providers (APPs -  Physician Assistants and Nurse Practitioners) who all work together to provide you with the care you need, when you need it.  We recommend signing up for the patient portal called "MyChart".  Sign up information is provided on this After Visit Summary.  MyChart is used to connect with patients for Virtual Visits (Telemedicine).  Patients are able to view lab/test results, encounter notes, upcoming appointments, etc.  Non-urgent messages can be sent to your provider as well.   To learn more about what you can do with MyChart, go to [email or web address].    Your next appointment:   6  month(s)  The format for your next appointment:   In Person  Provider:   Larae Grooms, MD     Other Instructions  High-Fiber Eating Plan Fiber, also called dietary fiber, is a type of carbohydrate. It is found foods such as fruits, vegetables, whole grains, and beans. A high-fiber diet can have many health benefits. Your health care provider may recommend a high-fiber diet to help: Prevent constipation. Fiber can make your bowel movements more regular. Lower your cholesterol. Relieve the following conditions: Inflammation of veins in the anus (hemorrhoids). Inflammation of specific areas of the digestive tract (uncomplicated diverticulosis). A problem of the large intestine, also called the colon, that sometimes causes pain and diarrhea (irritable bowel syndrome, or IBS). Prevent overeating as part of a weight-loss plan. Prevent heart disease, type 2 diabetes, and certain cancers. What are tips for following this plan? Reading food labels  Check the nutrition facts label on food products for the amount of dietary fiber. Choose foods that have 5 grams of fiber or more per serving. The goals for recommended daily fiber intake include: Men (age 87 or younger): 34-38 g. Men (over age 62): 28-34 g. Women (age 66 or younger): 25-28 g. Women (over age 4): 22-25 g. Your daily fiber goal is _____________ g. Shopping Choose whole fruits and vegetables instead of processed forms, such as apple juice or applesauce. Choose a wide variety of high-fiber foods such as avocados, lentils, oats, and kidney beans. Read the nutrition facts label of the foods you choose. Be aware of foods with added fiber. These foods often have  high sugar and sodium amounts per serving. Cooking Use whole-grain flour for baking and cooking. Cook with brown rice instead of white rice. Meal planning Start the day with a breakfast that is high in fiber, such as a cereal that contains 5 g of fiber or more per  serving. Eat breads and cereals that are made with whole-grain flour instead of refined flour or white flour. Eat brown rice, bulgur wheat, or millet instead of white rice. Use beans in place of meat in soups, salads, and pasta dishes. Be sure that half of the grains you eat each day are whole grains. General information You can get the recommended daily intake of dietary fiber by: Eating a variety of fruits, vegetables, grains, nuts, and beans. Taking a fiber supplement if you are not able to take in enough fiber in your diet. It is better to get fiber through food than from a supplement. Gradually increase how much fiber you consume. If you increase your intake of dietary fiber too quickly, you may have bloating, cramping, or gas. Drink plenty of water to help you digest fiber. Choose high-fiber snacks, such as berries, raw vegetables, nuts, and popcorn. What foods should I eat? Fruits Berries. Pears. Apples. Oranges. Avocado. Prunes and raisins. Dried figs. Vegetables Sweet potatoes. Spinach. Kale. Artichokes. Cabbage. Broccoli. Cauliflower. Green peas. Carrots. Squash. Grains Whole-grain breads. Multigrain cereal. Oats and oatmeal. Brown rice. Barley. Bulgur wheat. [place]. Quinoa. Bran muffins. Popcorn. Rye wafer crackers. Meats and other proteins Navy beans, kidney beans, and pinto beans. Soybeans. Split peas. Lentils. Nuts and seeds. Dairy Fiber-fortified yogurt. Beverages Fiber-fortified soy milk. Fiber-fortified orange juice. Other foods Fiber bars. The items listed above may not be a complete list of recommended foods and beverages. Contact a dietitian for more information. What foods should I avoid? Fruits Fruit juice. Cooked, strained fruit. Vegetables Fried potatoes. Canned vegetables. Well-cooked vegetables. Grains White bread. Pasta made with refined flour. White rice. Meats and other proteins Fatty cuts of meat. Fried chicken or fried fish. Dairy Milk. Yogurt.  Cream cheese. Sour cream. Fats and oils Butters. Beverages Soft drinks. Other foods Cakes and pastries. The items listed above may not be a complete list of foods and beverages to avoid. Talk with your dietitian about what choices are best for you. Summary Fiber is a type of carbohydrate. It is found in foods such as fruits, vegetables, whole grains, and beans. A high-fiber diet has many benefits. It can help to prevent constipation, lower blood cholesterol, aid weight loss, and reduce your risk of heart disease, diabetes, and certain cancers. Increase your intake of fiber gradually. Increasing fiber too quickly may cause cramping, bloating, and gas. Drink plenty of water while you increase the amount of fiber you consume. The best sources of fiber include whole fruits and vegetables, whole grains, nuts, seeds, and beans. This information is not intended to replace advice given to you by your health care provider. Make sure you discuss any questions you have with your health care provider. Document Revised: 07/10/2019 Document Reviewed: 07/10/2019 Elsevier Patient Education  [place].  Low-Sodium Eating Plan Sodium, which is an element that makes up salt, helps you maintain a healthy balance of fluids in your body. Too much sodium can increase your blood pressure and cause fluid and waste to be held in your body. Your health care provider or dietitian may recommend following this plan if you have high blood pressure (hypertension), kidney disease, liver disease, or heart failure. Eating less sodium can  help lower your blood pressure, reduce swelling, and protect your heart, liver, and kidneys. What are tips for following this plan? Reading food labels The Nutrition Facts label lists the amount of sodium in one serving of the food. If you eat more than one serving, you must multiply the listed amount of sodium by the number of servings. Choose foods with less than 140 mg of sodium per  serving. Avoid foods with 300 mg of sodium or more per serving. Shopping  Look for lower-sodium products, often labeled as "low-sodium" or "no salt added." Always check the sodium content, even if foods are labeled as "unsalted" or "no salt added." Buy fresh foods. Avoid canned foods and pre-made or frozen meals. Avoid canned, cured, or processed meats. Buy breads that have less than 80 mg of sodium per slice. Cooking  Eat more home-cooked food and less restaurant, buffet, and fast food. Avoid adding salt when cooking. Use salt-free seasonings or herbs instead of table salt or sea salt. Check with your health care provider or pharmacist before using salt substitutes. Cook with plant-based oils, such as canola, sunflower, or olive oil. Meal planning When eating at a restaurant, ask that your food be prepared with less salt or no salt, if possible. Avoid dishes labeled as brined, pickled, cured, smoked, or made with soy sauce, miso, or teriyaki sauce. Avoid foods that contain MSG (monosodium glutamate). MSG is sometimes added to [place] food, bouillon, and some canned foods. Make meals that can be grilled, baked, poached, roasted, or steamed. These are generally made with less sodium. General information Most people on this plan should limit their sodium intake to 1,500-2,000 mg (milligrams) of sodium each day. What foods should I eat? Fruits Fresh, frozen, or canned fruit. Fruit juice. Vegetables Fresh or frozen vegetables. "No salt added" canned vegetables. "No salt added" tomato sauce and paste. Low-sodium or reduced-sodium tomato and vegetable juice. Grains Low-sodium cereals, including oats, puffed wheat and rice, and shredded wheat. Low-sodium crackers. Unsalted rice. Unsalted pasta. Low-sodium bread. Whole-grain breads and whole-grain pasta. Meats and other proteins Fresh or frozen (no salt added) meat, poultry, seafood, and fish. Low-sodium canned tuna and salmon. Unsalted nuts.  Dried peas, beans, and lentils without added salt. Unsalted canned beans. Eggs. Unsalted nut butters. Dairy Milk. Soy milk. Cheese that is naturally low in sodium, such as ricotta cheese, fresh mozzarella, or Swiss cheese. Low-sodium or reduced-sodium cheese. Cream cheese. Yogurt. Seasonings and condiments Fresh and dried herbs and spices. Salt-free seasonings. Low-sodium mustard and ketchup. Sodium-free salad dressing. Sodium-free light mayonnaise. Fresh or refrigerated horseradish. Lemon juice. Vinegar. Other foods Homemade, reduced-sodium, or low-sodium soups. Unsalted popcorn and pretzels. Low-salt or salt-free chips. The items listed above may not be a complete list of foods and beverages you can eat. Contact a dietitian for more information. What foods should I avoid? Vegetables Sauerkraut, pickled vegetables, and relishes. Olives. [place] fries. Onion rings. Regular canned vegetables (not low-sodium or reduced-sodium). Regular canned tomato sauce and paste (not low-sodium or reduced-sodium). Regular tomato and vegetable juice (not low-sodium or reduced-sodium). Frozen vegetables in sauces. Grains Instant hot cereals. Bread stuffing, pancake, and biscuit mixes. Croutons. Seasoned rice or pasta mixes. Noodle soup cups. Boxed or frozen macaroni and cheese. Regular salted crackers. Self-rising flour. Meats and other proteins Meat or fish that is salted, canned, smoked, spiced, or pickled. Precooked or cured meat, such as sausages or meat loaves. Berniece Salines. Ham. Pepperoni. Hot dogs. Corned beef. Chipped beef. Salt pork. Jerky. Pickled herring. Anchovies and sardines.  Regular canned tuna. Salted nuts. Dairy Processed cheese and cheese spreads. Hard cheeses. Cheese curds. Blue cheese. Feta cheese. String cheese. Regular cottage cheese. Buttermilk. Canned milk. Fats and oils Salted butter. Regular margarine. Ghee. Bacon fat. Seasonings and condiments Onion salt, garlic salt, seasoned salt, table salt,  and sea salt. Canned and packaged gravies. Worcestershire sauce. Tartar sauce. Barbecue sauce. Teriyaki sauce. Soy sauce, including reduced-sodium. Steak sauce. Fish sauce. Oyster sauce. Cocktail sauce. Horseradish that you find on the shelf. Regular ketchup and mustard. Meat flavorings and tenderizers. Bouillon cubes. Hot sauce. Pre-made or packaged marinades. Pre-made or packaged taco seasonings. Relishes. Regular salad dressings. Salsa. Other foods Salted popcorn and pretzels. Corn chips and puffs. Potato and tortilla chips. Canned or dried soups. Pizza. Frozen entrees and pot pies. The items listed above may not be a complete list of foods and beverages you should avoid. Contact a dietitian for more information. Summary Eating less sodium can help lower your blood pressure, reduce swelling, and protect your heart, liver, and kidneys. Most people on this plan should limit their sodium intake to 1,500-2,000 mg (milligrams) of sodium each day. Canned, boxed, and frozen foods are high in sodium. Restaurant foods, fast foods, and pizza are also very high in sodium. You also get sodium by adding salt to food. Try to cook at home, eat more fresh fruits and vegetables, and eat less fast food and canned, processed, or prepared foods. This information is not intended to replace advice given to you by your health care provider. Make sure you discuss any questions you have with your health care provider. Document Revised: 04/11/2019 Document Reviewed: 02/05/2019 Elsevier Patient Education  [place]

## 2021-12-27 ENCOUNTER — Ambulatory Visit (HOSPITAL_COMMUNITY): Payer: Medicaid Other | Attending: Interventional Cardiology

## 2021-12-27 ENCOUNTER — Encounter: Payer: Self-pay | Admitting: Internal Medicine

## 2021-12-27 DIAGNOSIS — I10 Essential (primary) hypertension: Secondary | ICD-10-CM | POA: Diagnosis not present

## 2021-12-27 DIAGNOSIS — R9431 Abnormal electrocardiogram [ECG] [EKG]: Secondary | ICD-10-CM | POA: Diagnosis not present

## 2021-12-27 LAB — ECHOCARDIOGRAM COMPLETE
Area-P 1/2: 4.49 cm2
S' Lateral: 2.6 cm

## 2021-12-31 ENCOUNTER — Other Ambulatory Visit: Payer: Self-pay | Admitting: Medical

## 2022-01-31 ENCOUNTER — Encounter: Payer: Self-pay | Admitting: Gastroenterology

## 2022-02-08 ENCOUNTER — Encounter: Payer: Self-pay | Admitting: Surgery

## 2022-02-08 ENCOUNTER — Ambulatory Visit: Payer: Medicaid Other | Admitting: Surgery

## 2022-02-08 DIAGNOSIS — M25561 Pain in right knee: Secondary | ICD-10-CM | POA: Diagnosis not present

## 2022-02-08 DIAGNOSIS — M1711 Unilateral primary osteoarthritis, right knee: Secondary | ICD-10-CM

## 2022-02-08 DIAGNOSIS — G8929 Other chronic pain: Secondary | ICD-10-CM

## 2022-02-13 NOTE — Progress Notes (Signed)
Office Visit Note   Patient: Joanne Johns           Date of Birth: 06/19/1975           MRN: [id number] Visit Date: 02/08/2022              Requested by: Carlena Hurl, PA-C [address] [place],  [place] [address] PCP: Carlena Hurl, PA-C   Assessment & Plan: Visit Diagnoses:  1. Chronic pain of right knee     Plan: I will get right knee MRI to better evaluate the extent of her DJD and rule out meniscal tear.  Will see if there is something that would lend itself to conservative management with outpatient arthroscopy.  I did previously advised patient that ultimately come down her needing definitive treatment with total knee replacement.  Will see if we can exhaust all conservative measures before going that route.  Follow-Up Instructions: Return in about 3 weeks (around 03/01/2022) for [place] MRI SCAN.   Orders:  No orders of the defined types were placed in this encounter.  No orders of the defined types were placed in this encounter.     Procedures: No procedures performed   Clinical Data: No additional findings.   Subjective: No chief complaint on file.   HPI 46 year old female returns for recheck of her right knee pain.  Patient last seen by me 2 months ago and and I completed viscosupplementation injections.  Still complaining of some pain throughout the knee that comes and goes.  Has previous documented she is status post ACL reconstruction several years ago.   Objective: Vital Signs: There were no vitals taken for this visit.  Physical Exam Very pleasant female alert and oriented in no acute distress.  Gait is minimally antalgic.  Right knee positive patellofemoral crepitus.  Medial greater than lateral joint line tenderness.  Pain with McMurray's testing. Ortho Exam  Specialty Comments:  No specialty comments available.  Imaging: No results found.   PMFS History: Patient Active Problem List   Diagnosis Date Noted    Creatinine elevation 04/01/2021   Prediabetes 04/01/2021   Screening for heart disease 04/01/2021   BMI 45.0-49.9, adult ([facility]) 04/01/2021   Routine general medical examination at a health care facility 02/07/2016   Essential hypertension 02/07/2016   Respiratory tract infection 02/07/2016   IUD (intrauterine device) in place 02/07/2016   Obesity 12/25/2014   Screen for STD (sexually transmitted disease) 12/25/2014   Screening for cervical cancer 12/25/2014   Need for prophylactic vaccination and inoculation against influenza 12/25/2014   Encounter for health maintenance examination in adult 12/25/2014   Sleep disturbance 12/25/2014   Pelvic pain in female 12/25/2014   Encounter for surveillance of contraceptive pills 02/05/2014   Impaired fasting blood sugar 02/05/2014   Past Medical History:  Diagnosis Date   Abnormal Pap smear 2005   paps normal 2007, 2008, 2009, 2010, 2013, 2014   Back pain    Chlamydia 2016   Herpes gingivostomatitis    History of EKG 02/09/09   normal   History of pregnancy    5 pregnancies, 2 live births, 3 TABs   HSV infection 2018   positive HSV I and II   Hypertension    Impaired fasting blood sugar 2015   Liver hemangioma 2008   per ultrasound and 02/2013 MRI abdomen   Obesity     Family History  Problem Relation Age of Onset   Cancer Maternal Uncle  leukemia   Tuberculosis Father    Diabetes Father    Hypertension Father    Heart attack Father    Anesthesia problems Neg Hx    Heart disease Neg Hx    Hyperlipidemia Neg Hx    Stroke Neg Hx     Past Surgical History:  Procedure Laterality Date   COLPOSCOPY  05/2003   INTRAUTERINE DEVICE (IUD) INSERTION  2017   Mirena   KNEE SURGERY  2000   R knee, ACL, MCL meniscus   Social History   Occupational History   Occupation: Chiropractor    Comment: Scientist, physiological  Tobacco Use   Smoking status: Never   Smokeless tobacco: Never  Vaping Use   Vaping Use: Never used   Substance and Sexual Activity   Alcohol use: Yes    Comment: occ   Drug use: No   Sexual activity: Yes    Partners: Male    Birth control/protection: I.U.D.

## 2022-02-16 ENCOUNTER — Encounter: Payer: Self-pay | Admitting: Obstetrics and Gynecology

## 2022-02-16 ENCOUNTER — Ambulatory Visit (AMBULATORY_SURGERY_CENTER): Payer: Self-pay | Admitting: *Deleted

## 2022-02-16 VITALS — Ht 64.0 in | Wt 290.0 lb

## 2022-02-16 DIAGNOSIS — Z1211 Encounter for screening for malignant neoplasm of colon: Secondary | ICD-10-CM

## 2022-02-16 DIAGNOSIS — A6009 Herpesviral infection of other urogenital tract: Secondary | ICD-10-CM

## 2022-02-16 MED ORDER — PEG 3350-KCL-NA BICARB-NACL 420 G PO SOLR: 4000.0000 mL | Freq: Once | ORAL | 0 refills | Status: AC

## 2022-02-16 MED ORDER — VALACYCLOVIR HCL 500 MG PO TABS: ORAL_TABLET | ORAL | 2 refills | Status: DC

## 2022-02-16 NOTE — Progress Notes (Signed)
No egg or soy allergy known to patient  No issues known to pt with past sedation with any surgeries or procedures Patient denies ever being told they had issues or difficulty with intubation  No FH of Malignant Hyperthermia Pt is not on diet pills Pt is not on  home 02  Pt is not on blood thinners  Pt denies issues with constipation  Pt encouraged to use to use Singlecare or Goodrx to reduce cost  In person visit Patient's chart reviewed by Osvaldo Angst CNRA prior to previsit and patient appropriate for the [place].  Previsit completed and red dot placed by patient's name on their procedure day (on provider's schedule).  Marland Kitchen

## 2022-02-26 ENCOUNTER — Ambulatory Visit
Admission: RE | Admit: 2022-02-26 | Discharge: 2022-02-26 | Disposition: A | Discharge status: Home / Self Care | Payer: Medicaid Other | Source: Ambulatory Visit | Attending: Surgery | Admitting: Surgery

## 2022-02-26 DIAGNOSIS — M1711 Unilateral primary osteoarthritis, right knee: Secondary | ICD-10-CM

## 2022-02-26 DIAGNOSIS — G8929 Other chronic pain: Secondary | ICD-10-CM | POA: Diagnosis not present

## 2022-02-26 DIAGNOSIS — S83231A Complex tear of medial meniscus, current injury, right knee, initial encounter: Secondary | ICD-10-CM | POA: Diagnosis not present

## 2022-02-26 DIAGNOSIS — M25461 Effusion, right knee: Secondary | ICD-10-CM | POA: Diagnosis not present

## 2022-02-27 ENCOUNTER — Telehealth: Payer: Self-pay | Admitting: Gastroenterology

## 2022-02-27 DIAGNOSIS — Z1211 Encounter for screening for malignant neoplasm of colon: Secondary | ICD-10-CM

## 2022-02-27 MED ORDER — PEG 3350-KCL-NA BICARB-NACL 420 G PO SOLR: 4000.0000 mL | Freq: Once | ORAL | 0 refills | Status: AC

## 2022-02-27 MED ORDER — PEG 3350-KCL-NA BICARB-NACL 420 G PO SOLR: 4000.0000 mL | Freq: Once | ORAL | 0 refills | Status: DC

## 2022-02-27 NOTE — Telephone Encounter (Signed)
Returned patient call.  New prep sent to patient's preferred pharmacy and instructions updated and sent to patient via MyChart.

## 2022-02-27 NOTE — Telephone Encounter (Signed)
Patient called to reschedule her procedure on 12/12 due to having the flu. Patient states she has already mixed her prep solution and didn't know if it would be good until her next scheduled procedure on 12/28. Please advise.

## 2022-02-28 ENCOUNTER — Encounter: Payer: Medicaid Other | Admitting: Gastroenterology

## 2022-02-28 ENCOUNTER — Encounter: Payer: Self-pay | Admitting: Nurse Practitioner

## 2022-02-28 ENCOUNTER — Telehealth (INDEPENDENT_AMBULATORY_CARE_PROVIDER_SITE_OTHER): Payer: Medicaid Other | Admitting: Nurse Practitioner

## 2022-02-28 VITALS — Ht 64.0 in | Wt 282.0 lb

## 2022-02-28 DIAGNOSIS — J111 Influenza due to unidentified influenza virus with other respiratory manifestations: Secondary | ICD-10-CM | POA: Diagnosis not present

## 2022-02-28 MED ORDER — OSELTAMIVIR PHOSPHATE 75 MG PO CAPS: 75.0000 mg | ORAL_CAPSULE | Freq: Two times a day (BID) | ORAL | 0 refills | Status: DC

## 2022-02-28 NOTE — Patient Instructions (Addendum)
Be sure you are staying good and hydrated. Drink more water than you usually do because with fevers our bodies use more water. Gatorade, Pedialyte, etc are good options, too.   Alternate tylenol and ibuprofen every 4 hours for fever and body aches. Be sure to check any other medications you are using and don't take extra of either if they are in the other medication (tylenol will be listed as acetaminophen).   I have sent in Tamiflu for you. You will take this 2 times a day for 5 days. Be sure to eat a bite of food with it if it makes you sick to your stomach.   You should stay away from others for at least 5 days or until your fever is gone completely without medication for more than 24 hours (whichever is the longest).   If you start to feel worse or get new symptoms, please let us know.

## 2022-02-28 NOTE — Progress Notes (Signed)
Virtual Visit Encounter mychart visit.   I connected with  Joanne Johns on 03/03/22 at 10:00 AM EST by secure video and audio telemedicine application. I verified that I am speaking with the correct person using two identifiers.   I introduced myself as a Designer, jewellery with the practice. The limitations of evaluation and management by telemedicine discussed with the patient and the availability of in person appointments. The patient expressed verbal understanding and consent to proceed.  Participating parties in this visit include: Myself and patient  The patient is: Patient Location: Home I am: Provider Location: Office/Clinic Subjective:    CC and HPI: Joanne Johns is a 46 y.o. year old female presenting for new evaluation and treatment of Flu-like symptoms. Patient reports the following: Joanne Johns endorses exposure to the flu during a party on Saturday. On Sunday she woke with fevers, chills, body aches, sore throat, and dry cough. She tells me she generally feels unwell. She has not had the flu vaccine this year. She denies shortness of breath, chest pain, nausea, vomiting, diarrhea.   Party satruday night, a friend tested positive for the flu. She started feeling bad on Sunday.   Past medical history, Surgical history, Family history not pertinant except as noted below, Social history, Allergies, and medications have been entered into the medical record, reviewed, and corrections made.   Review of Systems:  All review of systems negative except what is listed in the HPI  Objective:    Alert and oriented x 4 Audibly congested with general ill appearance Speaking in clear sentences with no shortness of breath. No distress.  Impression and Recommendations:    Problem List Items Addressed This Visit     Influenza - Primary    Exposure to influenza with symptom onset consistent with virus shortly thereafter. Most likely influenza given the course of her illness. Fortunately,  she is within the window to receive tamiflu. We will plan to start that immediately. Recommendations for supportive care provided included acetaminophen, ibuprofen alternating every 4 hours for fever and pain, warm tea/theraflu (monitor acetaminophen dosing- no more than 3g in 24 hours), mucinex, and plenty of rest and hydration. She will let us know if her symptoms worsen or fail to improve after 7 days.       Relevant Medications   oseltamivir (TAMIFLU) 75 MG capsule    orders and follow up as documented in EMR I discussed the assessment and treatment plan with the patient. The patient was provided an opportunity to ask questions and all were answered. The patient agreed with the plan and demonstrated an understanding of the instructions.   The patient was advised to call back or seek an in-person evaluation if the symptoms worsen or if the condition fails to improve as anticipated.  Follow-Up: prn  I provided 18 minutes of non-face-to-face interaction with this non face-to-face encounter including intake, same-day documentation, and chart review.   Orma Render, NP , DNP, AGNP-c [place] [facility] [phone] (fax)

## 2022-03-03 ENCOUNTER — Encounter: Payer: Self-pay | Admitting: Nurse Practitioner

## 2022-03-03 DIAGNOSIS — J111 Influenza due to unidentified influenza virus with other respiratory manifestations: Secondary | ICD-10-CM | POA: Insufficient documentation

## 2022-03-03 NOTE — Assessment & Plan Note (Signed)
Exposure to influenza with symptom onset consistent with virus shortly thereafter. Most likely influenza given the course of her illness. Fortunately, she is within the window to receive tamiflu. We will plan to start that immediately. Recommendations for supportive care provided included acetaminophen, ibuprofen alternating every 4 hours for fever and pain, warm tea/theraflu (monitor acetaminophen dosing- no more than 3g in 24 hours), mucinex, and plenty of rest and hydration. She will let us know if her symptoms worsen or fail to improve after 7 days.

## 2022-03-07 ENCOUNTER — Encounter: Payer: Self-pay | Admitting: Nurse Practitioner

## 2022-03-07 DIAGNOSIS — N3090 Cystitis, unspecified without hematuria: Secondary | ICD-10-CM

## 2022-03-07 MED ORDER — FLUCONAZOLE 150 MG PO TABS: ORAL_TABLET | ORAL | 2 refills | Status: DC

## 2022-03-07 MED ORDER — NITROFURANTOIN MONOHYD MACRO 100 MG PO CAPS: 100.0000 mg | ORAL_CAPSULE | Freq: Two times a day (BID) | ORAL | 0 refills | Status: DC

## 2022-03-07 NOTE — Addendum Note (Signed)
Addended by: Ihan Pat, Clarise Cruz E on: 03/07/2022 01:41 PM   Modules accepted: Orders

## 2022-03-14 ENCOUNTER — Encounter: Payer: Self-pay | Admitting: Certified Registered Nurse Anesthetist

## 2022-03-16 ENCOUNTER — Ambulatory Visit (AMBULATORY_SURGERY_CENTER): Payer: Medicaid Other | Admitting: Gastroenterology

## 2022-03-16 ENCOUNTER — Encounter: Payer: Self-pay | Admitting: Gastroenterology

## 2022-03-16 VITALS — BP 142/85 | HR 87 | Temp 97.5°F | Resp 14 | Ht 64.0 in | Wt 290.0 lb

## 2022-03-16 DIAGNOSIS — K621 Rectal polyp: Secondary | ICD-10-CM | POA: Diagnosis not present

## 2022-03-16 DIAGNOSIS — D128 Benign neoplasm of rectum: Secondary | ICD-10-CM

## 2022-03-16 DIAGNOSIS — Z1211 Encounter for screening for malignant neoplasm of colon: Secondary | ICD-10-CM

## 2022-03-16 DIAGNOSIS — I10 Essential (primary) hypertension: Secondary | ICD-10-CM | POA: Diagnosis not present

## 2022-03-16 MED ORDER — SODIUM CHLORIDE 0.9 % IV SOLN: 500.0000 mL | Freq: Once | INTRAVENOUS | Status: DC

## 2022-03-16 NOTE — Progress Notes (Signed)
Report given to PACU, vss 

## 2022-03-16 NOTE — Patient Instructions (Signed)
Patient has a contact number available for emergencies. The signs and symptoms of potential delayed complications were discussed with the patient. Return to normal activities tomorrow. Written discharge instructions were provided to the patient. - Resume previous diet. - Continue present medications. - Await pathology results.  Handouts on polyps provided.   YOU HAD AN ENDOSCOPIC PROCEDURE TODAY AT [facility]:   Refer to the procedure report that was given to you for any specific questions about what was found during the examination.  If the procedure report does not answer your questions, please call your gastroenterologist to clarify.  If you requested that your care partner not be given the details of your procedure findings, then the procedure report has been included in a sealed envelope for you to review at your convenience later.  YOU SHOULD EXPECT: Some feelings of bloating in the abdomen. Passage of more gas than usual.  Walking can help get rid of the air that was put into your GI tract during the procedure and reduce the bloating. If you had a lower endoscopy (such as a colonoscopy or flexible sigmoidoscopy) you may notice spotting of blood in your stool or on the toilet paper. If you underwent a bowel prep for your procedure, you may not have a normal bowel movement for a few days.  Please Note:  You might notice some irritation and congestion in your nose or some drainage.  This is from the oxygen used during your procedure.  There is no need for concern and it should clear up in a day or so.  SYMPTOMS TO REPORT IMMEDIATELY:  Following lower endoscopy (colonoscopy or flexible sigmoidoscopy):  Excessive amounts of blood in the stool  Significant tenderness or worsening of abdominal pains  Swelling of the abdomen that is new, acute  Fever of 100F or higher   For urgent or emergent issues, a gastroenterologist can be reached at any hour by calling [phone]  [phone]. Do not use MyChart messaging for urgent concerns.    DIET:  We do recommend a small meal at first, but then you may proceed to your regular diet.  Drink plenty of fluids but you should avoid alcoholic beverages for 24 hours.  ACTIVITY:  You should plan to take it easy for the rest of today and you should NOT DRIVE or use heavy machinery until tomorrow (because of the sedation medicines used during the test).    FOLLOW UP: Our staff will call the number listed on your records the next business day following your procedure.  We will call around 7:15- 8:00 am to check on you and address any questions or concerns that you may have regarding the information given to you following your procedure. If we do not reach you, we will leave a message.     If any biopsies were taken you will be contacted by phone or by letter within the next 1-3 weeks.  Please call us at [phone] if you have not heard about the biopsies in 3 weeks.    SIGNATURES/CONFIDENTIALITY: You and/or your care partner have signed paperwork which will be entered into your electronic medical record.  These signatures attest to the fact that that the information above on your After Visit Summary has been reviewed and is understood.  Full responsibility of the confidentiality of this discharge information lies with you and/or your care-partner.

## 2022-03-16 NOTE — Progress Notes (Signed)
Called to room to assist during endoscopic procedure.  Patient ID and intended procedure confirmed with present staff. Received instructions for my participation in the procedure from the performing physician.  

## 2022-03-16 NOTE — Progress Notes (Signed)
Pt's states no medical or surgical changes since previsit or office visit. Vital signs completed by DT.

## 2022-03-16 NOTE — Progress Notes (Signed)
Referring Provider: Carlena Hurl, PA-C Primary Care Physician:  Carlena Hurl, PA-C  Indication for Colonoscopy:  Colon cancer screening   IMPRESSION:  Need for colon cancer screening Appropriate candidate for monitored anesthesia care  PLAN: Colonoscopy in the [place] today   HPI: Joanne Johns is a 46 y.o. female presents for screening colonoscopy.  No prior colonoscopy or colon cancer screening.  No known family history of colon cancer or polyps. No family history of uterine/endometrial cancer, pancreatic cancer or gastric/stomach cancer.   Past Medical History:  Diagnosis Date   Abnormal Pap smear 2005   paps normal 2007, 2008, 2009, 2010, 2013, 2014   Arthritis    Back pain    Chlamydia 2016   Herpes gingivostomatitis    History of EKG 02/09/2009   normal   History of pregnancy    5 pregnancies, 2 live births, 3 TABs   HSV infection 2018   positive HSV I and II   Hypertension    Impaired fasting blood sugar 2015   Liver hemangioma 2008   per ultrasound and 02/2013 MRI abdomen   Obesity    Respiratory tract infection 02/07/2016    Past Surgical History:  Procedure Laterality Date   COLPOSCOPY  05/2003   INTRAUTERINE DEVICE (IUD) INSERTION  2017   Mirena   KNEE SURGERY  2000   R knee, ACL, MCL meniscus    Current Outpatient Medications  Medication Sig Dispense Refill   amLODipine (NORVASC) 10 MG tablet TAKE 1 TABLET(10 MG) BY MOUTH DAILY 90 tablet 0   levonorgestrel (MIRENA) 20 MCG/24HR IUD 1 each by Intrauterine route once.     valsartan-hydrochlorothiazide (DIOVAN-HCT) 80-12.5 MG tablet TAKE 1 TABLET BY MOUTH DAILY 90 tablet 0   fluconazole (DIFLUCAN) 150 MG tablet Take one tablet by mouth at the first sign of symptoms of yeast. If no resolution, repeat dose in 72 hours. 2 tablet 2   nitrofurantoin, macrocrystal-monohydrate, (MACROBID) 100 MG capsule Take 1 capsule (100 mg total) by mouth 2 (two) times daily. 10 capsule 0   oseltamivir (TAMIFLU)  75 MG capsule Take 1 capsule (75 mg total) by mouth 2 (two) times daily. 10 capsule 0   TRIVISC 25 MG/2.5ML SOSY INJECT INTO THE KNEE ONCE WEEKLY     valACYclovir (VALTREX) 500 MG tablet TAKE 1 TABLET BY MOUTH daily, increase to TWICE DAILY FOR 3 DAYS AS NEEDED FOR OUTBREAK 90 tablet 2   Current Facility-Administered Medications  Medication Dose Route Frequency Provider Last Rate Last Admin   0.9 %  sodium chloride infusion  500 mL Intravenous Once Thornton Park, MD        Allergies as of 03/16/2022   (No Known Allergies)    Family History  Problem Relation Age of Onset   Tuberculosis Father    Diabetes Father    Hypertension Father    Heart attack Father    Cancer Maternal Uncle        leukemia   Anesthesia problems Neg Hx    Heart disease Neg Hx    Hyperlipidemia Neg Hx    Stroke Neg Hx    Colon polyps Neg Hx    Colon cancer Neg Hx    Esophageal cancer Neg Hx    Rectal cancer Neg Hx    Stomach cancer Neg Hx      Physical Exam: General:   Alert,  well-nourished, pleasant and cooperative in NAD Head:  Normocephalic and atraumatic. Eyes:  Sclera clear, no icterus.  Conjunctiva pink. Mouth:  No deformity or lesions.   Neck:  Supple; no masses or thyromegaly. Lungs:  Clear throughout to auscultation.   No wheezes. Heart:  Regular rate and rhythm; no murmurs. Abdomen:  Soft, non-tender, nondistended, normal bowel sounds, no rebound or guarding.  Msk:  Symmetrical. No boney deformities LAD: No inguinal or umbilical LAD Extremities:  No clubbing or edema. Neurologic:  Alert and  oriented x4;  grossly nonfocal Skin:  No obvious rash or bruise. Psych:  Alert and cooperative. Normal mood and affect.     Studies/Results: No results found.    Saniya Tranchina L. Tarri Glenn, MD, MPH 03/16/2022, 10:52 AM

## 2022-03-16 NOTE — Op Note (Signed)
[facility] Patient Name: Joanne Johns Procedure Date: 03/16/2022 11:01 AM MRN: [id number] Endoscopist: Thornton Park MD, MD, [id number] Age: 46 Referring MD:  Date of Birth: Oct 31, 1975 Gender: Female Account #: [id number] Procedure:                Colonoscopy Indications:              Screening for colorectal malignant neoplasm, This                            is the patient's first colonoscopy                           No known family history of colon cancer or polyps Medicines:                Monitored Anesthesia Care Procedure:                Pre-Anesthesia Assessment:                           - Prior to the procedure, a History and Physical                            was performed, and patient medications and                            allergies were reviewed. The patient's tolerance of                            previous anesthesia was also reviewed. The risks                            and benefits of the procedure and the sedation                            options and risks were discussed with the patient.                            All questions were answered, and informed consent                            was obtained. Prior Anticoagulants: The patient has                            taken no anticoagulant or antiplatelet agents. ASA                            Grade Assessment: III - A patient with severe                            systemic disease. After reviewing the risks and                            benefits, the patient was deemed in satisfactory  condition to undergo the procedure.                           After obtaining informed consent, the colonoscope                            was passed under direct vision. Throughout the                            procedure, the patient's blood pressure, pulse, and                            oxygen saturations were monitored continuously. The                            CF [id number]  #[id number] was introduced through the anus                            and advanced to the 3 cm into the ileum. A second                            forward view of the right colon was performed. The                            colonoscopy was performed without difficulty. The                            patient tolerated the procedure well. The quality                            of the bowel preparation was good. The terminal                            ileum, ileocecal valve, appendiceal orifice, and                            rectum were photographed. Scope In: 11:07:10 AM Scope Out: 11:19:42 AM Scope Withdrawal Time: 0 hours 10 minutes 22 seconds  Total Procedure Duration: 0 hours 12 minutes 32 seconds  Findings:                 The perianal and digital rectal examinations were                            normal.                           A 1 mm polyp was found in the distal rectum. The                            polyp was flat. The polyp was removed with a cold                            snare. Resection and retrieval were complete.  Estimated blood loss was minimal.                           The exam was otherwise without abnormality on                            direct and retroflexion views. Complications:            No immediate complications. Estimated Blood Loss:     Estimated blood loss was minimal. Impression:               - One 1 mm polyp in the distal rectum, removed with                            a cold snare. Resected and retrieved.                           - The examination was otherwise normal on direct                            and retroflexion views. Recommendation:           - Patient has a contact number available for                            emergencies. The signs and symptoms of potential                            delayed complications were discussed with the                            patient. Return to normal activities tomorrow.                             Written discharge instructions were provided to the                            patient.                           - Resume previous diet.                           - Continue present medications.                           - Await pathology results.                           - Repeat colonoscopy date to be determined after                            pending pathology results are reviewed for                            surveillance.                           -  Emerging evidence supports eating a diet of                            fruits, vegetables, grains, calcium, and yogurt                            while reducing red meat and alcohol may reduce the                            risk of colon cancer.                           - Thank you for allowing me to be involved in your                            colon cancer prevention. Thornton Park MD, MD 03/16/2022 11:27:55 AM This report has been signed electronically.

## 2022-03-17 ENCOUNTER — Telehealth: Payer: Self-pay

## 2022-03-17 ENCOUNTER — Other Ambulatory Visit: Payer: Self-pay | Admitting: Medical

## 2022-03-17 NOTE — Telephone Encounter (Signed)
  Follow up Call-     03/16/2022   10:29 AM  Call back number  Post procedure Call Back phone  # [phone]  Permission to leave phone message Yes     Patient questions:  Do you have a fever, pain , or abdominal swelling? No. Pain Score  0 *  Have you tolerated food without any problems? Yes.    Have you been able to return to your normal activities? Yes.    Do you have any questions about your discharge instructions: Diet   No. Medications  No. Follow up visit  No.  Do you have questions or concerns about your Care? No.  Actions: * If pain score is 4 or above: No action needed, pain <4.

## 2022-03-21 ENCOUNTER — Encounter: Payer: Self-pay | Admitting: Gastroenterology

## 2022-03-21 MED ORDER — AMLODIPINE BESYLATE 10 MG PO TABS: ORAL_TABLET | ORAL | 0 refills | Status: DC

## 2022-03-21 NOTE — Telephone Encounter (Signed)
Resent in med

## 2022-03-21 NOTE — Progress Notes (Signed)
47 y.o. [id number] Single Black or African American Not Hispanic or Latino female here for annual exam.  She has a mirena IUD, placed in 7/17. Just occasional spotting.  Sexually active, same partner. No dyspareunia.   H/O HSV, uses valtrex prn. She recently started taking the valtrex daily for frequent tingling sensations.    No bowel or bladder issues.   No LMP recorded. (Menstrual status: IUD).          Sexually active: Yes.    The current method of family planning is IUD.    Exercising: No.  The patient does not participate in regular exercise at present. Smoker:  no  Health Maintenance: Pap:   02/26/19 WNL Hr HPV Neg,  01/17/2017 NEG with NEG HR HPV, 12-25-14 WNL + HR HPV, - for HPV 16/18  History of abnormal Pap:  yes, no surgery on the cervix.  MMG:  04/17/18 bi-rads 1 neg  BMD:   n/a Colonoscopy: 03/16/22 f/u/10 years  TDaP:  05/04/11  Gardasil: 03/03/20    reports that she has never smoked. She has never used smokeless tobacco. She reports current alcohol use. She reports that she does not use drugs.  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 Son is 10. Daughter is 68, 3 kids (50, 42, & 45.34 years old).   Past Medical History:  Diagnosis Date   Abnormal Pap smear 2005   paps normal 2007, 2008, 2009, 2010, 2013, 2014   Arthritis    Back pain    Chlamydia 2016   Herpes gingivostomatitis    History of EKG 02/09/2009   normal   History of pregnancy    5 pregnancies, 2 live births, 3 TABs   HSV infection 2018   positive HSV I and II   Hypertension    Impaired fasting blood sugar 2015   Liver hemangioma 2008   per ultrasound and 02/2013 MRI abdomen   Obesity    Respiratory tract infection 02/07/2016    Past Surgical History:  Procedure Laterality Date   COLPOSCOPY  05/2003   INTRAUTERINE DEVICE (IUD) INSERTION  2017   Mirena   KNEE SURGERY  2000   R knee, ACL, MCL meniscus    Current Outpatient Medications  Medication Sig Dispense Refill    amLODipine (NORVASC) 10 MG tablet TAKE 1 TABLET(10 MG) BY MOUTH DAILY 90 tablet 0   levonorgestrel (MIRENA) 20 MCG/24HR IUD 1 each by Intrauterine route once.     TRIVISC 25 MG/2.5ML SOSY INJECT INTO THE KNEE ONCE WEEKLY     valACYclovir (VALTREX) 500 MG tablet TAKE 1 TABLET BY MOUTH daily, increase to TWICE DAILY FOR 3 DAYS AS NEEDED FOR OUTBREAK 90 tablet 2   valsartan-hydrochlorothiazide (DIOVAN-HCT) 80-12.5 MG tablet TAKE 1 TABLET BY MOUTH DAILY 90 tablet 0   No current facility-administered medications for this visit.    Family History  Problem Relation Age of Onset   Tuberculosis Father    Diabetes Father    Hypertension Father    Heart attack Father    Cancer Maternal Uncle        leukemia   Anesthesia problems Neg Hx    Heart disease Neg Hx    Hyperlipidemia Neg Hx    Stroke Neg Hx    Colon polyps Neg Hx    Colon cancer Neg Hx    Esophageal cancer Neg Hx    Rectal cancer Neg Hx    Stomach cancer Neg Hx     Review of Systems  All other systems reviewed and are negative.   Exam:   BP 128/82   Pulse 77   Ht '5\' 4"'$  (1.626 m)   Wt 287 lb (130.2 kg)   SpO2 100%   BMI 49.26 kg/m   Weight change: '@WEIGHTCHANGE'$ @ Height:   Height: '5\' 4"'$  (162.6 cm)  Ht Readings from Last 3 Encounters:  03/28/22 '5\' 4"'$  (1.626 m)  03/16/22 '5\' 4"'$  (1.626 m)  02/28/22 '5\' 4"'$  (1.626 m)    General appearance: alert, cooperative and appears stated age Head: Normocephalic, without obvious abnormality, atraumatic Neck: no adenopathy, supple, symmetrical, trachea midline and thyroid normal to inspection and palpation Lungs: clear to auscultation bilaterally Cardiovascular: regular rate and rhythm Breasts: normal appearance, no masses or tenderness Abdomen: soft, non-tender; non distended,  no masses,  no organomegaly Extremities: extremities normal, atraumatic, no cyanosis or edema Skin: Skin color, texture, turgor normal. No rashes or lesions Lymph nodes: Cervical, supraclavicular, and  axillary nodes normal. No abnormal inguinal nodes palpated Neurologic: Grossly normal   Pelvic: External genitalia:  no lesions              Urethra:  normal appearing urethra with no masses, tenderness or lesions              Bartholins and Skenes: normal                 Vagina: normal appearing vagina with normal color and discharge, no lesions              Cervix: no lesions and IUD string not seen               Bimanual Exam:  Uterus:   no masses or tenderness              Adnexa: no mass, fullness, tenderness               Rectovaginal: Confirms               Anus:  normal sphincter tone, no lesions  Gae Dry, CMA chaperoned for the exam.  1. Well woman exam Discussed breast self exam Discussed calcium and vit D intake Screening labs with primary Mammogram over due, she will schedule  2. IUD check up Doing well, string not seen, previously in place on ultrasound  3. Screening for cervical cancer - Cytology - PAP  4. Herpes genitalis in women She is on daily valtrex, will call when she needs a refill  5. Screening examination for STD (sexually transmitted disease) - RPR - HIV Antibody (routine testing w rflx) - Hepatitis C antibody - Cytology - PAP

## 2022-03-21 NOTE — Addendum Note (Signed)
Addended by: Minette Headland A on: 03/21/2022 12:46 PM   Modules accepted: Orders

## 2022-03-28 ENCOUNTER — Encounter: Payer: Self-pay | Admitting: Obstetrics and Gynecology

## 2022-03-28 ENCOUNTER — Ambulatory Visit (INDEPENDENT_AMBULATORY_CARE_PROVIDER_SITE_OTHER): Payer: Medicaid Other | Admitting: Obstetrics and Gynecology

## 2022-03-28 ENCOUNTER — Other Ambulatory Visit (HOSPITAL_COMMUNITY)
Admission: RE | Admit: 2022-03-28 | Discharge: 2022-03-28 | Disposition: A | Discharge status: Home / Self Care | Payer: Medicaid Other | Source: Ambulatory Visit | Attending: Obstetrics and Gynecology | Admitting: Obstetrics and Gynecology

## 2022-03-28 VITALS — BP 128/82 | HR 77 | Ht 64.0 in | Wt 287.0 lb

## 2022-03-28 DIAGNOSIS — Z113 Encounter for screening for infections with a predominantly sexual mode of transmission: Secondary | ICD-10-CM | POA: Insufficient documentation

## 2022-03-28 DIAGNOSIS — Z30431 Encounter for routine checking of intrauterine contraceptive device: Secondary | ICD-10-CM

## 2022-03-28 DIAGNOSIS — Z01419 Encounter for gynecological examination (general) (routine) without abnormal findings: Secondary | ICD-10-CM | POA: Diagnosis not present

## 2022-03-28 DIAGNOSIS — A6009 Herpesviral infection of other urogenital tract: Secondary | ICD-10-CM | POA: Diagnosis not present

## 2022-03-28 DIAGNOSIS — Z124 Encounter for screening for malignant neoplasm of cervix: Secondary | ICD-10-CM | POA: Diagnosis not present

## 2022-03-28 NOTE — Patient Instructions (Signed)

## 2022-03-29 LAB — CYTOLOGY - PAP
Chlamydia: NEGATIVE
Comment: NEGATIVE
Comment: NEGATIVE
Comment: NEGATIVE
Comment: NORMAL
Diagnosis: NEGATIVE
High risk HPV: NEGATIVE
Neisseria Gonorrhea: NEGATIVE
Trichomonas: NEGATIVE

## 2022-03-29 LAB — HIV ANTIBODY (ROUTINE TESTING W REFLEX): HIV 1&2 Ab, 4th Generation: NONREACTIVE

## 2022-03-29 LAB — RPR: RPR Ser Ql: NONREACTIVE

## 2022-03-29 LAB — HEPATITIS C ANTIBODY: Hepatitis C Ab: NONREACTIVE

## 2022-04-19 ENCOUNTER — Ambulatory Visit: Payer: Medicaid Other | Admitting: Physician Assistant

## 2022-04-19 ENCOUNTER — Encounter: Payer: Self-pay | Admitting: Physician Assistant

## 2022-04-19 DIAGNOSIS — M1711 Unilateral primary osteoarthritis, right knee: Secondary | ICD-10-CM

## 2022-04-19 DIAGNOSIS — G8929 Other chronic pain: Secondary | ICD-10-CM

## 2022-04-19 DIAGNOSIS — M25561 Pain in right knee: Secondary | ICD-10-CM | POA: Diagnosis not present

## 2022-04-19 MED ORDER — METHYLPREDNISOLONE ACETATE 40 MG/ML IJ SUSP
80.0000 mg | INTRAMUSCULAR | Status: AC | PRN
  Administered 2022-04-19: 80 mg via INTRA_ARTICULAR

## 2022-04-19 MED ORDER — BUPIVACAINE HCL 0.25 % IJ SOLN
2.0000 mL | INTRAMUSCULAR | Status: AC | PRN
  Administered 2022-04-19: 2 mL via INTRA_ARTICULAR

## 2022-04-19 MED ORDER — LIDOCAINE HCL 1 % IJ SOLN
2.0000 mL | INTRAMUSCULAR | Status: AC | PRN
  Administered 2022-04-19: 2 mL

## 2022-04-19 NOTE — Progress Notes (Signed)
Office Visit Note   Patient: Joanne Johns           Date of Birth: 1976-01-23           MRN: [id number] Visit Date: 04/19/2022              Requested by: Carlena Hurl, PA-C [address] [place],  [place] [address] PCP: Carlena Hurl, PA-C  Chief Complaint  Patient presents with  . Right Knee - Follow-up      HPI: Joanne Johns is a pleasant 47 year old woman with ongoing pain in her right knee.  She is 23 years status post ACL reconstruction with partial medial meniscectomy and a medial collateral ligament strain after a basketball injury.  She has done fairly well however in the course of the last year she has had increasing pain medially.  No recurrent injury.  She was seen and evaluated by Benjiman Core who tried both cortisone injections and gel injections.  She got about 2 months relief with the gel injections.  She comes in today for continuing right knee pain.  She denies any new injury.  She did have an MRI and would like to review that today.  Assessment & Plan: Visit Diagnoses:  1. Chronic pain of right knee   2. Unilateral primary osteoarthritis, right knee     Plan: Long discussion with the patient also discussed her with Dr. Lorin Mercy.  We reviewed her MRI.  She does have significant tricompartmental arthritis especially in the medial compartment.  She has evidence of previous medial meniscectomy with some small retearing however this does not appear to be in the joint.  Discussed with the patient that doing a knee arthroscopy probably would not relieve much of her symptoms.  She is interested in knee replacement.  She is somewhat young and I told her to try to put this off if she could.  Either way right now she needs to concentrate on weight loss.  Her BMI is currently 49.  She is interested in working on weight loss I referred her to the weight loss provider through [facility].  In the meantime I did offer her a steroid injection today we will go forward with that she may  follow-up as needed  Follow-Up Instructions: No follow-ups on file.   Ortho Exam  Patient is alert, oriented, no adenopathy, well-dressed, normal affect, normal respiratory effort. Examination of her right knee she has no effusion no erythema she does have grinding with range of motion she is tender to palpation over the medial greater than the lateral joint line she is neurovascular intact compartments are soft and nontender  Imaging: No results found. No images are attached to the encounter.  Labs: Lab Results  Component Value Date   HGBA1C 5.8 (H) 03/24/2021   HGBA1C 5.6 03/03/2020   HGBA1C 5.4 02/26/2019   LABORGA METHICILLIN RESISTANT STAPHYLOCOCCUS AUREUS 02/04/2013     Lab Results  Component Value Date   ALBUMIN 4.3 11/26/2021   ALBUMIN 4.2 03/03/2020   ALBUMIN 4.3 02/26/2019    No results found for: "MG" No results found for: "VD25OH"  No results found for: "PREALBUMIN"    Latest Ref Rng & Units 11/26/2021   11:21 AM 03/24/2021    9:28 AM 03/03/2020    9:27 AM  CBC EXTENDED  WBC 4.0 - 10.5 K/uL 7.4  6.8  7.6   RBC 3.87 - 5.11 MIL/uL 4.84  4.59  4.67   Hemoglobin 12.0 - 15.0 g/dL 14.5  13.7  14.3   HCT 36.0 - 46.0 % 43.9  41.0  42.5   Platelets 150 - 400 K/uL 300  293  315   NEUT# 1.7 - 7.7 K/uL 4.9     Lymph# 0.7 - 4.0 K/uL 2.0        There is no height or weight on file to calculate BMI.  Orders:  Orders Placed This Encounter  Procedures  . Amb Ref to Medical Weight Management   No orders of the defined types were placed in this encounter.    Procedures: Large Joint Inj on 04/19/2022 11:34 AM Indications: pain and diagnostic evaluation Details: 25 G 1.5 in needle, anteromedial approach  Arthrogram: No  Medications: 80 mg methylPREDNISolone acetate 40 MG/ML; 2 mL lidocaine 1 %; 2 mL bupivacaine 0.25 % Outcome: tolerated well, no immediate complications Procedure, treatment alternatives, risks and benefits explained, specific risks discussed.  Consent was given by the patient.    Clinical Data: No additional findings.  ROS:  All other systems negative, except as noted in the HPI. Review of Systems  Objective: Vital Signs: There were no vitals taken for this visit.  Specialty Comments:  No specialty comments available.  PMFS History: Patient Active Problem List   Diagnosis Date Noted  . Influenza 03/03/2022  . Creatinine elevation 04/01/2021  . Prediabetes 04/01/2021  . Screening for heart disease 04/01/2021  . BMI 45.0-49.9, adult ([place]) 04/01/2021  . Routine general medical examination at a health care facility 02/07/2016  . Essential hypertension 02/07/2016  . IUD (intrauterine device) in place 02/07/2016  . Obesity 12/25/2014  . Screen for STD (sexually transmitted disease) 12/25/2014  . Screening for cervical cancer 12/25/2014  . Need for prophylactic vaccination and inoculation against influenza 12/25/2014  . Encounter for health maintenance examination in adult 12/25/2014  . Sleep disturbance 12/25/2014  . Pelvic pain in female 12/25/2014  . Encounter for surveillance of contraceptive pills 02/05/2014  . Impaired fasting blood sugar 02/05/2014   Past Medical History:  Diagnosis Date  . Abnormal Pap smear 2005   paps normal 2007, 2008, 2009, 2010, 2013, 2014  . Arthritis   . Back pain   . Chlamydia 2016  . Herpes gingivostomatitis   . History of EKG 02/09/2009   normal  . History of pregnancy    5 pregnancies, 2 live births, 3 TABs  . HSV infection 2018   positive HSV I and II  . Hypertension   . Impaired fasting blood sugar 2015  . Liver hemangioma 2008   per ultrasound and 02/2013 MRI abdomen  . Obesity   . Respiratory tract infection 02/07/2016    Family History  Problem Relation Age of Onset  . Tuberculosis Father   . Diabetes Father   . Hypertension Father   . Heart attack Father   . Cancer Maternal Uncle        leukemia  . Anesthesia problems Neg Hx   . Heart disease Neg Hx   .  Hyperlipidemia Neg Hx   . Stroke Neg Hx   . Colon polyps Neg Hx   . Colon cancer Neg Hx   . Esophageal cancer Neg Hx   . Rectal cancer Neg Hx   . Stomach cancer Neg Hx     Past Surgical History:  Procedure Laterality Date  . COLPOSCOPY  05/2003  . INTRAUTERINE DEVICE (IUD) INSERTION  2017   Mirena  . KNEE SURGERY  2000   R knee, ACL, MCL meniscus   Social History  Occupational History  . Occupation: Chiropractor    Comment: Scientist, physiological  Tobacco Use  . Smoking status: Never  . Smokeless tobacco: Never  Vaping Use  . Vaping Use: Never used  Substance and Sexual Activity  . Alcohol use: Yes    Comment: occ  . Drug use: No  . Sexual activity: Yes    Partners: Male    Birth control/protection: I.U.D.

## 2022-04-24 ENCOUNTER — Ambulatory Visit: Payer: Medicaid Other | Admitting: Medical

## 2022-04-24 DIAGNOSIS — M62838 Other muscle spasm: Secondary | ICD-10-CM

## 2022-04-24 DIAGNOSIS — M542 Cervicalgia: Secondary | ICD-10-CM | POA: Diagnosis not present

## 2022-04-24 DIAGNOSIS — M549 Dorsalgia, unspecified: Secondary | ICD-10-CM

## 2022-04-24 DIAGNOSIS — R519 Headache, unspecified: Secondary | ICD-10-CM | POA: Diagnosis not present

## 2022-04-24 MED ORDER — IBUPROFEN 800 MG PO TABS: 800.0000 mg | ORAL_TABLET | Freq: Three times a day (TID) | ORAL | 0 refills | Status: DC | PRN

## 2022-04-24 MED ORDER — TIZANIDINE HCL 4 MG PO TABS: 4.0000 mg | ORAL_TABLET | Freq: Two times a day (BID) | ORAL | 0 refills | Status: DC | PRN

## 2022-04-24 MED ORDER — VALSARTAN-HYDROCHLOROTHIAZIDE 80-12.5 MG PO TABS: 1.0000 | ORAL_TABLET | Freq: Every day | ORAL | 0 refills | Status: DC

## 2022-04-24 MED ORDER — AMLODIPINE BESYLATE 10 MG PO TABS: ORAL_TABLET | ORAL | 0 refills | Status: DC
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
## 2022-04-24 NOTE — Addendum Note (Signed)
Addended by: Minette Headland A on: 04/24/2022 02:40 PM   Modules accepted: Orders

## 2022-04-24 NOTE — Progress Notes (Signed)
I have put in order for PT

## 2022-04-26 ENCOUNTER — Ambulatory Visit: Payer: Medicaid Other | Attending: Medical | Admitting: Rehabilitative and Restorative Service Providers"

## 2022-04-26 ENCOUNTER — Other Ambulatory Visit: Payer: Self-pay

## 2022-04-26 ENCOUNTER — Encounter: Payer: Self-pay | Admitting: Rehabilitative and Restorative Service Providers"

## 2022-04-26 DIAGNOSIS — M62838 Other muscle spasm: Secondary | ICD-10-CM | POA: Diagnosis not present

## 2022-04-26 DIAGNOSIS — R252 Cramp and spasm: Secondary | ICD-10-CM

## 2022-04-26 DIAGNOSIS — M6281 Muscle weakness (generalized): Secondary | ICD-10-CM | POA: Diagnosis not present

## 2022-04-26 DIAGNOSIS — M542 Cervicalgia: Secondary | ICD-10-CM | POA: Insufficient documentation

## 2022-04-26 DIAGNOSIS — M5459 Other low back pain: Secondary | ICD-10-CM

## 2022-04-26 DIAGNOSIS — X58XXXA Exposure to other specified factors, initial encounter: Secondary | ICD-10-CM | POA: Insufficient documentation

## 2022-04-26 DIAGNOSIS — M549 Dorsalgia, unspecified: Secondary | ICD-10-CM | POA: Insufficient documentation
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
## 2022-04-26 NOTE — Patient Instructions (Signed)
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
## 2022-04-28 ENCOUNTER — Encounter: Payer: Self-pay | Admitting: Physical Therapy

## 2022-04-28 ENCOUNTER — Ambulatory Visit: Payer: Medicaid Other | Admitting: Physical Therapy

## 2022-04-28 DIAGNOSIS — M5459 Other low back pain: Secondary | ICD-10-CM

## 2022-04-28 DIAGNOSIS — R252 Cramp and spasm: Secondary | ICD-10-CM

## 2022-04-28 DIAGNOSIS — M62838 Other muscle spasm: Secondary | ICD-10-CM | POA: Diagnosis not present

## 2022-04-28 DIAGNOSIS — M542 Cervicalgia: Secondary | ICD-10-CM

## 2022-04-28 DIAGNOSIS — M6281 Muscle weakness (generalized): Secondary | ICD-10-CM | POA: Diagnosis not present

## 2022-04-28 DIAGNOSIS — M549 Dorsalgia, unspecified: Secondary | ICD-10-CM | POA: Diagnosis not present

## 2022-05-03 ENCOUNTER — Ambulatory Visit: Payer: Medicaid Other

## 2022-05-03 DIAGNOSIS — M5459 Other low back pain: Secondary | ICD-10-CM

## 2022-05-03 DIAGNOSIS — R252 Cramp and spasm: Secondary | ICD-10-CM

## 2022-05-03 DIAGNOSIS — M542 Cervicalgia: Secondary | ICD-10-CM

## 2022-05-03 DIAGNOSIS — M62838 Other muscle spasm: Secondary | ICD-10-CM | POA: Diagnosis not present

## 2022-05-03 DIAGNOSIS — M6281 Muscle weakness (generalized): Secondary | ICD-10-CM

## 2022-05-03 DIAGNOSIS — M549 Dorsalgia, unspecified: Secondary | ICD-10-CM | POA: Diagnosis not present
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
## 2022-05-03 NOTE — Patient Instructions (Signed)

## 2022-05-04 ENCOUNTER — Encounter: Payer: Medicaid Other | Admitting: Bariatrics

## 2022-05-05 ENCOUNTER — Encounter: Payer: Self-pay | Admitting: Rehabilitative and Restorative Service Providers"

## 2022-05-05 ENCOUNTER — Ambulatory Visit: Payer: Medicaid Other | Admitting: Rehabilitative and Restorative Service Providers"

## 2022-05-05 DIAGNOSIS — R252 Cramp and spasm: Secondary | ICD-10-CM

## 2022-05-05 DIAGNOSIS — M549 Dorsalgia, unspecified: Secondary | ICD-10-CM | POA: Diagnosis not present

## 2022-05-05 DIAGNOSIS — M6281 Muscle weakness (generalized): Secondary | ICD-10-CM

## 2022-05-05 DIAGNOSIS — M542 Cervicalgia: Secondary | ICD-10-CM | POA: Diagnosis not present

## 2022-05-05 DIAGNOSIS — M5459 Other low back pain: Secondary | ICD-10-CM

## 2022-05-05 DIAGNOSIS — M62838 Other muscle spasm: Secondary | ICD-10-CM | POA: Diagnosis not present
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
## 2022-05-09 ENCOUNTER — Encounter: Payer: Medicaid Other | Admitting: Nurse Practitioner

## 2022-05-11 ENCOUNTER — Ambulatory Visit: Payer: Medicaid Other | Admitting: Rehabilitative and Restorative Service Providers"

## 2022-05-11 ENCOUNTER — Encounter: Payer: Self-pay | Admitting: Rehabilitative and Restorative Service Providers"

## 2022-05-11 DIAGNOSIS — M62838 Other muscle spasm: Secondary | ICD-10-CM | POA: Diagnosis not present

## 2022-05-11 DIAGNOSIS — M6281 Muscle weakness (generalized): Secondary | ICD-10-CM

## 2022-05-11 DIAGNOSIS — M542 Cervicalgia: Secondary | ICD-10-CM | POA: Diagnosis not present

## 2022-05-11 DIAGNOSIS — M5459 Other low back pain: Secondary | ICD-10-CM

## 2022-05-11 DIAGNOSIS — R252 Cramp and spasm: Secondary | ICD-10-CM

## 2022-05-11 DIAGNOSIS — M549 Dorsalgia, unspecified: Secondary | ICD-10-CM | POA: Diagnosis not present
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
## 2022-05-15 ENCOUNTER — Encounter: Payer: Self-pay | Admitting: Rehabilitative and Restorative Service Providers"

## 2022-05-15 ENCOUNTER — Ambulatory Visit: Payer: Medicaid Other | Admitting: Rehabilitative and Restorative Service Providers"

## 2022-05-15 DIAGNOSIS — R252 Cramp and spasm: Secondary | ICD-10-CM

## 2022-05-15 DIAGNOSIS — M542 Cervicalgia: Secondary | ICD-10-CM

## 2022-05-15 DIAGNOSIS — M549 Dorsalgia, unspecified: Secondary | ICD-10-CM | POA: Diagnosis not present

## 2022-05-15 DIAGNOSIS — M5459 Other low back pain: Secondary | ICD-10-CM

## 2022-05-15 DIAGNOSIS — M6281 Muscle weakness (generalized): Secondary | ICD-10-CM

## 2022-05-15 DIAGNOSIS — M62838 Other muscle spasm: Secondary | ICD-10-CM | POA: Diagnosis not present
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
## 2022-05-17 ENCOUNTER — Ambulatory Visit: Payer: Medicaid Other | Admitting: Rehabilitative and Restorative Service Providers"

## 2022-05-17 ENCOUNTER — Encounter: Payer: Self-pay | Admitting: Rehabilitative and Restorative Service Providers"

## 2022-05-17 DIAGNOSIS — R252 Cramp and spasm: Secondary | ICD-10-CM

## 2022-05-17 DIAGNOSIS — M542 Cervicalgia: Secondary | ICD-10-CM | POA: Diagnosis not present

## 2022-05-17 DIAGNOSIS — M549 Dorsalgia, unspecified: Secondary | ICD-10-CM | POA: Diagnosis not present

## 2022-05-17 DIAGNOSIS — M6281 Muscle weakness (generalized): Secondary | ICD-10-CM

## 2022-05-17 DIAGNOSIS — M62838 Other muscle spasm: Secondary | ICD-10-CM | POA: Diagnosis not present

## 2022-05-17 DIAGNOSIS — M5459 Other low back pain: Secondary | ICD-10-CM
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
## 2022-05-24 ENCOUNTER — Ambulatory Visit: Payer: Medicaid Other

## 2022-05-29 ENCOUNTER — Encounter: Payer: Self-pay | Admitting: Rehabilitative and Restorative Service Providers"

## 2022-05-29 ENCOUNTER — Ambulatory Visit: Payer: Medicaid Other | Attending: Medical | Admitting: Rehabilitative and Restorative Service Providers"

## 2022-05-29 DIAGNOSIS — R252 Cramp and spasm: Secondary | ICD-10-CM | POA: Insufficient documentation

## 2022-05-29 DIAGNOSIS — M5459 Other low back pain: Secondary | ICD-10-CM | POA: Diagnosis not present

## 2022-05-29 DIAGNOSIS — M6281 Muscle weakness (generalized): Secondary | ICD-10-CM | POA: Insufficient documentation

## 2022-05-29 DIAGNOSIS — M542 Cervicalgia: Secondary | ICD-10-CM | POA: Insufficient documentation
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
## 2022-05-29 NOTE — Progress Notes (Deleted)
Cardiology Office Note   Date:  05/29/2022   ID:  Joanne Johns, DOB 1975-07-10, MRN [id number]  PCP:  Carlena Hurl, PA-C    No chief complaint on file.  Hypertension  Wt Readings from Last 3 Encounters:  04/24/22 286 lb (129.7 kg)  03/28/22 287 lb (130.2 kg)  03/16/22 290 lb (131.5 kg)       History of Present Illness: Joanne Johns is a 47 y.o. female  She went to the urgent care/ER in September 2023.: "with shortness of breath and chest tightness.  She has been feeling palpitations which she describes as an increased heart rate but not an irregular heart rate for the past 1 week or so.  This morning since waking up at about 6:30 AM she has noticed a little bit of chest tightness that has been constant.  No fevers though it may be a little bit of cough over the last couple days.  No sore throat.  She also feels short of breath, especially on some exertion.  She denies a little bit of shortness of breath when laying down and first getting up in the morning.  No PND.  No new leg swelling.  No calf swelling.  She did recently take a trip to [place] less than 1 month ago.  She went to urgent care and due to an abnormal EKG they sent her here."   CT showed: "Cardiovascular: The heart size is normal. No substantial pericardial effusion. No thoracic aortic aneurysm. There is no filling defect within the opacified pulmonary arteries to suggest the presence of an acute pulmonary embolus."   Woke up Saturday 1 week ago, felt heart beating  fast, beating out of her chest.  Went to urgent care.  Was advised to go to the ED.  She went home and a little later that day went to ED at [facility].  Had CT chest, chest xray, labs, EKG.  Was advised to f/u with PCP.   She notes no current symptoms other than BPs running high.  Has not had any more elevated pulse rate since ED visit.     No current chest pain, SOB, DOE, edema, palpitations.  No prior sleep study.     Compliant with  amlodipine.   No other aggravating or relieving factors.     No other c/o."   He added valsartan.  Some days, BP has been improved. In the past, she had headaches with high BP.  No recent headaches.     I saw her after the ER visit and: "Of note, she was not taking her BP meds prior to the ER visit."  In September 2023, she was counseled to increase exercise, decrease fast food intake and decrease sodium intake.  Echocardiogram was performed.  We also talked about stress reduction through MBS R. "Normal LV(left ventricle)/RV (right ventricle )function. Normal valvular function.  Focus on BP control, regular exercise and healthy diet "  Past Medical History:  Diagnosis Date   Abnormal Pap smear 2005   paps normal 2007, 2008, 2009, 2010, 2013, 2014   Arthritis    Back pain    Chlamydia 2016   Herpes gingivostomatitis    History of EKG 02/09/2009   normal   History of pregnancy    5 pregnancies, 2 live births, 3 TABs   HSV infection 2018   positive HSV I and II   Hypertension    Impaired fasting blood sugar 2015   Liver hemangioma  2008   per ultrasound and 02/2013 MRI abdomen   Obesity    Respiratory tract infection 02/07/2016    Past Surgical History:  Procedure Laterality Date   COLPOSCOPY  05/2003   INTRAUTERINE DEVICE (IUD) INSERTION  2017   Mirena   KNEE SURGERY  2000   R knee, ACL, MCL meniscus     Current Outpatient Medications  Medication Sig Dispense Refill   amLODipine (NORVASC) 10 MG tablet TAKE 1 TABLET(10 MG) BY MOUTH DAILY 90 tablet 0   ibuprofen (ADVIL) 800 MG tablet Take 1 tablet (800 mg total) by mouth every 8 (eight) hours as needed. 30 tablet 0   levonorgestrel (MIRENA) 20 MCG/24HR IUD 1 each by Intrauterine route once.     tiZANidine (ZANAFLEX) 4 MG tablet Take 1 tablet (4 mg total) by mouth 2 (two) times daily as needed for muscle spasms. 30 tablet 0   valACYclovir (VALTREX) 500 MG tablet TAKE 1 TABLET BY MOUTH daily, increase to TWICE DAILY FOR 3  DAYS AS NEEDED FOR OUTBREAK 90 tablet 2   valsartan-hydrochlorothiazide (DIOVAN-HCT) 80-12.5 MG tablet Take 1 tablet by mouth daily. 90 tablet 0   No current facility-administered medications for this visit.    Allergies:   Patient has no known allergies.    Social History:  The patient  reports that she has never smoked. She has never used smokeless tobacco. She reports current alcohol use. She reports that she does not use drugs.   Family History:  The patient's ***family history includes Cancer in her maternal uncle; Diabetes in her father; Heart attack in her father; Hypertension in her father; Tuberculosis in her father.    ROS:  Please see the history of present illness.   Otherwise, review of systems are positive for ***.   All other systems are reviewed and negative.    PHYSICAL EXAM: VS:  There were no vitals taken for this visit. , BMI There is no height or weight on file to calculate BMI. GEN: Well nourished, well developed, in no acute distress HEENT: normal Neck: no JVD, carotid bruits, or masses Cardiac: ***RRR; no murmurs, rubs, or gallops,no edema  Respiratory:  clear to auscultation bilaterally, normal work of breathing GI: soft, nontender, nondistended, + BS MS: no deformity or atrophy Skin: warm and dry, no rash Neuro:  Strength and sensation are intact Psych: euthymic mood, full affect   EKG:   The ekg ordered today demonstrates ***   Recent Labs: 11/26/2021: ALT 19; B Natriuretic Peptide 32.0; BUN 12; Creatinine, Ser 1.05; Hemoglobin 14.5; Platelets 300; Potassium 3.9; Sodium 136   Lipid Panel    Component Value Date/Time   CHOL 212 (H) 03/24/2021 0928   CHOL 195 03/03/2020 0927   TRIG 73 03/24/2021 0928   HDL 87 03/24/2021 0928   HDL 84 03/03/2020 0927   CHOLHDL 2.4 03/24/2021 0928   VLDL 13 02/14/2016 0844   LDLCALC 109 (H) 03/24/2021 0928     Other studies Reviewed: Additional studies/ records that were reviewed today with results  demonstrating: ***.   ASSESSMENT AND PLAN:  Hypertension: Abnormal ECG: T wave inversions Prediabetes: Morbid obesity:   Current medicines are reviewed at length with the patient today.  The patient concerns regarding her medicines were addressed.  The following changes have been made:  No change***  Labs/ tests ordered today include: *** No orders of the defined types were placed in this encounter.   Recommend 150 minutes/week of aerobic exercise Low fat, low carb, high fiber  diet recommended  Disposition:   FU in ***   Signed, Larae Grooms, MD  05/29/2022 3:34 PM    [facility] [place], [place], [place]  [address] Phone: [phone]; Fax: [phone]

## 2022-05-30 ENCOUNTER — Ambulatory Visit: Payer: Medicaid Other | Admitting: Interventional Cardiology

## 2022-05-30 DIAGNOSIS — R9431 Abnormal electrocardiogram [ECG] [EKG]: Secondary | ICD-10-CM

## 2022-05-30 DIAGNOSIS — R7303 Prediabetes: Secondary | ICD-10-CM

## 2022-05-30 DIAGNOSIS — I10 Essential (primary) hypertension: Secondary | ICD-10-CM

## 2022-06-01 ENCOUNTER — Ambulatory Visit: Payer: Medicaid Other | Admitting: Rehabilitative and Restorative Service Providers"

## 2022-06-01 ENCOUNTER — Encounter: Payer: Self-pay | Admitting: Rehabilitative and Restorative Service Providers"

## 2022-06-01 DIAGNOSIS — R252 Cramp and spasm: Secondary | ICD-10-CM | POA: Diagnosis not present

## 2022-06-01 DIAGNOSIS — M542 Cervicalgia: Secondary | ICD-10-CM | POA: Diagnosis not present

## 2022-06-01 DIAGNOSIS — M5459 Other low back pain: Secondary | ICD-10-CM

## 2022-06-01 DIAGNOSIS — M6281 Muscle weakness (generalized): Secondary | ICD-10-CM
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
## 2022-06-05 ENCOUNTER — Ambulatory Visit: Payer: Medicaid Other

## 2022-06-05 DIAGNOSIS — M5459 Other low back pain: Secondary | ICD-10-CM

## 2022-06-05 DIAGNOSIS — M6281 Muscle weakness (generalized): Secondary | ICD-10-CM

## 2022-06-05 DIAGNOSIS — M542 Cervicalgia: Secondary | ICD-10-CM | POA: Diagnosis not present

## 2022-06-05 DIAGNOSIS — R252 Cramp and spasm: Secondary | ICD-10-CM | POA: Diagnosis not present
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
## 2022-06-07 ENCOUNTER — Encounter: Payer: Self-pay | Admitting: Rehabilitative and Restorative Service Providers"

## 2022-06-07 ENCOUNTER — Ambulatory Visit: Payer: Medicaid Other | Admitting: Rehabilitative and Restorative Service Providers"

## 2022-06-07 DIAGNOSIS — M542 Cervicalgia: Secondary | ICD-10-CM | POA: Diagnosis not present

## 2022-06-07 DIAGNOSIS — M5459 Other low back pain: Secondary | ICD-10-CM | POA: Diagnosis not present

## 2022-06-07 DIAGNOSIS — R252 Cramp and spasm: Secondary | ICD-10-CM | POA: Diagnosis not present

## 2022-06-07 DIAGNOSIS — M6281 Muscle weakness (generalized): Secondary | ICD-10-CM
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
## 2022-06-12 ENCOUNTER — Encounter: Payer: Medicaid Other | Admitting: Rehabilitative and Restorative Service Providers"

## 2022-06-12 NOTE — Progress Notes (Deleted)
Cardiology Office Note   Date:  06/12/2022   ID:  Joanne Johns, DOB [birth year], MRN [id number]  PCP:  Joanne Hurl, PA-C    No chief complaint on file.  Hypertension  Wt Readings from Last 3 Encounters:  04/24/22 286 lb (129.7 kg)  03/28/22 287 lb (130.2 kg)  03/16/22 290 lb (131.5 kg)       History of Present Illness: Joanne Johns is a 47 y.o. female  She went to the urgent care/ER in September 2023.   Prior records show: "with shortness of breath and chest tightness.  She has been feeling palpitations which she describes as an increased heart rate but not an irregular heart rate for the past 1 week or so.  This morning since waking up at about 6:30 AM she has noticed a little bit of chest tightness that has been constant.  No fevers though it may be a little bit of cough over the last couple days.  No sore throat.  She also feels short of breath, especially on some exertion.  She denies a little bit of shortness of breath when laying down and first getting up in the morning.  No PND.  No new leg swelling.  No calf swelling.  She did recently take a trip to [place] less than 1 month ago.  She went to urgent care and due to an abnormal EKG they sent her here."   CT showed: "Cardiovascular: The heart size is normal. No substantial pericardial effusion. No thoracic aortic aneurysm. There is no filling defect within the opacified pulmonary arteries to suggest the presence of an acute pulmonary embolus."   Woke up Saturday 1 week ago, felt heart beating  fast, beating out of her chest.  Went to urgent care.  Was advised to go to the ED.  She went home and a little later that day went to ED at [facility].  Had CT chest, chest xray, labs, EKG.  Was advised to f/u with PCP.   She notes no current symptoms other than BPs running high.  Has not had any more elevated pulse rate since ED visit.     No current chest pain, SOB, DOE, edema, palpitations.  No prior sleep study.      Compliant with amlodipine.   No other aggravating or relieving factors.     No other c/o."   He added valsartan.  Some days, BP has been improved. In the past, she had headaches with high BP.  No recent headaches.     I saw her after the ER visit and: "Of note, she was not taking her BP meds prior to the ER visit."  In September 2023, she was counseled to increase exercise, decrease fast food intake and decrease sodium intake.  Echocardiogram was performed.  We also talked about stress reduction through MBS R. "Normal LV(left ventricle)/RV (right ventricle )function. Normal valvular function.  Focus on BP control, regular exercise and healthy diet "  Past Medical History:  Diagnosis Date   Abnormal Pap smear 2005   paps normal 2007, 2008, 2009, 2010, 2013, 2014   Arthritis    Back pain    Chlamydia 2016   Herpes gingivostomatitis    History of EKG 02/09/2009   normal   History of pregnancy    5 pregnancies, 2 live births, 3 TABs   HSV infection 2018   positive HSV I and II   Hypertension    Impaired fasting blood sugar  2015   Liver hemangioma 2008   per ultrasound and 02/2013 MRI abdomen   Obesity    Respiratory tract infection 02/07/2016    Past Surgical History:  Procedure Laterality Date   COLPOSCOPY  05/2003   INTRAUTERINE DEVICE (IUD) INSERTION  2017   Mirena   KNEE SURGERY  2000   R knee, ACL, MCL meniscus     Current Outpatient Medications  Medication Sig Dispense Refill   amLODipine (NORVASC) 10 MG tablet TAKE 1 TABLET(10 MG) BY MOUTH DAILY 90 tablet 0   ibuprofen (ADVIL) 800 MG tablet Take 1 tablet (800 mg total) by mouth every 8 (eight) hours as needed. 30 tablet 0   levonorgestrel (MIRENA) 20 MCG/24HR IUD 1 each by Intrauterine route once.     tiZANidine (ZANAFLEX) 4 MG tablet Take 1 tablet (4 mg total) by mouth 2 (two) times daily as needed for muscle spasms. 30 tablet 0   valACYclovir (VALTREX) 500 MG tablet TAKE 1 TABLET BY MOUTH daily, increase to  TWICE DAILY FOR 3 DAYS AS NEEDED FOR OUTBREAK 90 tablet 2   valsartan-hydrochlorothiazide (DIOVAN-HCT) 80-12.5 MG tablet Take 1 tablet by mouth daily. 90 tablet 0   No current facility-administered medications for this visit.    Allergies:   Patient has no known allergies.    Social History:  The patient  reports that she has never smoked. She has never used smokeless tobacco. She reports current alcohol use. She reports that she does not use drugs.   Family History:  The patient's ***family history includes Cancer in her maternal uncle; Diabetes in her father; Heart attack in her father; Hypertension in her father; Tuberculosis in her father.    ROS:  Please see the history of present illness.   Otherwise, review of systems are positive for ***.   All other systems are reviewed and negative.    PHYSICAL EXAM: VS:  There were no vitals taken for this visit. , BMI There is no height or weight on file to calculate BMI. GEN: Well nourished, well developed, in no acute distress HEENT: normal Neck: no JVD, carotid bruits, or masses Cardiac: ***RRR; no murmurs, rubs, or gallops,no edema  Respiratory:  clear to auscultation bilaterally, normal work of breathing GI: soft, nontender, nondistended, + BS MS: no deformity or atrophy Skin: warm and dry, no rash Neuro:  Strength and sensation are intact Psych: euthymic mood, full affect   EKG:   The ekg ordered today demonstrates ***   Recent Labs: 11/26/2021: ALT 19; B Natriuretic Peptide 32.0; BUN 12; Creatinine, Ser 1.05; Hemoglobin 14.5; Platelets 300; Potassium 3.9; Sodium 136   Lipid Panel    Component Value Date/Time   CHOL 212 (H) 03/24/2021 0928   CHOL 195 03/03/2020 0927   TRIG 73 03/24/2021 0928   HDL 87 03/24/2021 0928   HDL 84 03/03/2020 0927   CHOLHDL 2.4 03/24/2021 0928   VLDL 13 02/14/2016 0844   LDLCALC 109 (H) 03/24/2021 0928     Other studies Reviewed: Additional studies/ records that were reviewed today with  results demonstrating: ***.   ASSESSMENT AND PLAN:  Hypertension: We talked about referral to hypertension clinic in the past.  She wanted to make changes on her own and follow-up with [place]. Abnormal ECG: T wave inversions Prediabetes: Morbid obesity: We talked about stress management in the past.  Including MBSR.   Current medicines are reviewed at length with the patient today.  The patient concerns regarding her medicines were addressed.  The following  changes have been made:  No change***  Labs/ tests ordered today include: *** No orders of the defined types were placed in this encounter.   Recommend 150 minutes/week of aerobic exercise Low fat, low carb, high fiber diet recommended  Disposition:   FU in ***   Signed, Larae Grooms, MD  06/12/2022 9:04 PM    [facility] [place], [place], [place]  [address] Phone: [phone]; Fax: [phone]

## 2022-06-13 ENCOUNTER — Ambulatory Visit: Payer: Medicaid Other | Attending: Interventional Cardiology | Admitting: Interventional Cardiology

## 2022-06-13 DIAGNOSIS — R7303 Prediabetes: Secondary | ICD-10-CM

## 2022-06-13 DIAGNOSIS — R9431 Abnormal electrocardiogram [ECG] [EKG]: Secondary | ICD-10-CM

## 2022-06-13 DIAGNOSIS — I10 Essential (primary) hypertension: Secondary | ICD-10-CM

## 2022-06-14 ENCOUNTER — Encounter: Payer: Medicaid Other | Admitting: Rehabilitative and Restorative Service Providers"

## 2022-06-19 ENCOUNTER — Encounter: Payer: Medicaid Other | Admitting: Rehabilitative and Restorative Service Providers"

## 2022-06-21 ENCOUNTER — Encounter: Payer: Medicaid Other | Admitting: Rehabilitative and Restorative Service Providers"

## 2022-07-13 ENCOUNTER — Encounter: Payer: Self-pay | Admitting: Obstetrics and Gynecology

## 2022-07-13 ENCOUNTER — Ambulatory Visit: Payer: Medicaid Other | Admitting: Obstetrics and Gynecology

## 2022-07-13 VITALS — BP 128/70 | HR 64 | Wt 290.0 lb

## 2022-07-13 DIAGNOSIS — Z113 Encounter for screening for infections with a predominantly sexual mode of transmission: Secondary | ICD-10-CM | POA: Diagnosis not present

## 2022-07-13 NOTE — Progress Notes (Signed)
GYNECOLOGY  VISIT   HPI: 47 y.o.   Single Black or African American Not Hispanic or Latino  female   [phone] with No LMP recorded. (Menstrual status: IUD).   here for STD testing. No longer with her partner, doing fine.  She is not having any symptoms.   GYNECOLOGIC HISTORY: No LMP recorded. (Menstrual status: IUD). Mirena IUD, placed in 7/17 Contraception:IUD  Menopausal hormone therapy: n/a        OB History     Gravida  5   Para  2   Term  2   Preterm  0   AB  3   Living  2      SAB  0   IAB  3   Ectopic  0   Multiple  0   Live Births  2              Patient Active Problem List   Diagnosis Date Noted   Influenza 03/03/2022   Creatinine elevation 04/01/2021   Prediabetes 04/01/2021   Screening for heart disease 04/01/2021   BMI 45.0-49.9, adult 04/01/2021   Routine general medical examination at a health care facility 02/07/2016   Essential hypertension 02/07/2016   IUD (intrauterine device) in place 02/07/2016   Obesity 12/25/2014   Screen for STD (sexually transmitted disease) 12/25/2014   Screening for cervical cancer 12/25/2014   Need for prophylactic vaccination and inoculation against influenza 12/25/2014   Encounter for health maintenance examination in adult 12/25/2014   Sleep disturbance 12/25/2014   Pelvic pain in female 12/25/2014   Encounter for surveillance of contraceptive pills 02/05/2014   Impaired fasting blood sugar 02/05/2014    Past Medical History:  Diagnosis Date   Abnormal Pap smear 2005   paps normal 2007, 2008, 2009, 2010, 2013, 2014   Arthritis    Back pain    Chlamydia 2016   Herpes gingivostomatitis    History of EKG 02/09/2009   normal   History of pregnancy    5 pregnancies, 2 live births, 3 TABs   HSV infection 2018   positive HSV I and II   Hypertension    Impaired fasting blood sugar 2015   Liver hemangioma 2008   per ultrasound and 02/2013 MRI abdomen   Obesity    Respiratory tract infection  02/07/2016    Past Surgical History:  Procedure Laterality Date   COLPOSCOPY  05/2003   INTRAUTERINE DEVICE (IUD) INSERTION  2017   Mirena   KNEE SURGERY  2000   R knee, ACL, MCL meniscus    Current Outpatient Medications  Medication Sig Dispense Refill   amLODipine (NORVASC) 10 MG tablet TAKE 1 TABLET(10 MG) BY MOUTH DAILY 90 tablet 0   ibuprofen (ADVIL) 800 MG tablet Take 1 tablet (800 mg total) by mouth every 8 (eight) hours as needed. 30 tablet 0   levonorgestrel (MIRENA) 20 MCG/24HR IUD 1 each by Intrauterine route once.     tiZANidine (ZANAFLEX) 4 MG tablet Take 1 tablet (4 mg total) by mouth 2 (two) times daily as needed for muscle spasms. 30 tablet 0   valACYclovir (VALTREX) 500 MG tablet TAKE 1 TABLET BY MOUTH daily, increase to TWICE DAILY FOR 3 DAYS AS NEEDED FOR OUTBREAK 90 tablet 2   valsartan-hydrochlorothiazide (DIOVAN-HCT) 80-12.5 MG tablet Take 1 tablet by mouth daily. 90 tablet 0   No current facility-administered medications for this visit.     ALLERGIES: Patient has no known allergies.  Family History  Problem Relation Age  of Onset   Tuberculosis Father    Diabetes Father    Hypertension Father    Heart attack Father    Cancer Maternal Uncle        leukemia   Anesthesia problems Neg Hx    Heart disease Neg Hx    Hyperlipidemia Neg Hx    Stroke Neg Hx    Colon polyps Neg Hx    Colon cancer Neg Hx    Esophageal cancer Neg Hx    Rectal cancer Neg Hx    Stomach cancer Neg Hx     Social History   Socioeconomic History   Marital status: Single    Spouse name: Not on file   Number of children: Not on file   Years of education: Not on file   Highest education level: Not on file  Occupational History   Occupation: Advertising account executive    Comment: Orthoptist  Tobacco Use   Smoking status: Never   Smokeless tobacco: Never  Vaping Use   Vaping Use: Never used  Substance and Sexual Activity   Alcohol use: Yes    Comment: occ   Drug use: No    Sexual activity: Yes    Partners: Male    Birth control/protection: I.U.D.  Other Topics Concern   Not on file  Social History Narrative   Single, relationship with son's father off and on x 3 years, 2 children, ages 41yo son and 20yo daughter.  Has 1st granddaughter.  Exercise - not much.  Educational psychologist firm.  Sexually active.  No condom use.   Social Determinants of Health   Financial Resource Strain: Not on file  Food Insecurity: Not on file  Transportation Needs: Not on file  Physical Activity: Not on file  Stress: Not on file  Social Connections: Not on file  Intimate Partner Violence: Not on file    Review of Systems  All other systems reviewed and are negative.   PHYSICAL EXAMINATION:    There were no vitals taken for this visit.    General appearance: alert, cooperative and appears stated age  Pelvic: External genitalia:  no lesions              Urethra:  normal appearing urethra with no masses, tenderness or lesions              Bartholins and Skenes: normal                 Vagina: normal appearing vagina with normal color and discharge, no lesions              Cervix: no lesions and IUD string missing (previously in place on u/s)               1. Screening examination for STD (sexually transmitted disease) - SURESWAB CT/NG/T. vaginalis - RPR - HIV Antibody (routine testing w rflx) - Hepatitis C antibody

## 2022-07-14 LAB — SURESWAB CT/NG/T. VAGINALIS
C. trachomatis RNA, TMA: NOT DETECTED
N. gonorrhoeae RNA, TMA: NOT DETECTED
Trichomonas vaginalis RNA: DETECTED — AB

## 2022-07-14 LAB — HEPATITIS C ANTIBODY: Hepatitis C Ab: NONREACTIVE

## 2022-07-14 LAB — HIV ANTIBODY (ROUTINE TESTING W REFLEX): HIV 1&2 Ab, 4th Generation: NONREACTIVE

## 2022-07-14 LAB — RPR: RPR Ser Ql: NONREACTIVE

## 2022-07-17 ENCOUNTER — Encounter: Payer: Self-pay | Admitting: Obstetrics and Gynecology

## 2022-07-18 ENCOUNTER — Encounter: Payer: Self-pay | Admitting: Obstetrics and Gynecology

## 2022-07-18 ENCOUNTER — Other Ambulatory Visit: Payer: Self-pay | Admitting: Obstetrics and Gynecology

## 2022-07-18 MED ORDER — METRONIDAZOLE 500 MG PO TABS: 500.0000 mg | ORAL_TABLET | Freq: Two times a day (BID) | ORAL | 0 refills | Status: DC

## 2022-07-21 DIAGNOSIS — I10 Essential (primary) hypertension: Secondary | ICD-10-CM | POA: Diagnosis not present

## 2022-07-21 DIAGNOSIS — R509 Fever, unspecified: Secondary | ICD-10-CM | POA: Diagnosis not present

## 2022-07-21 DIAGNOSIS — U071 COVID-19: Secondary | ICD-10-CM | POA: Diagnosis not present

## 2022-08-17 ENCOUNTER — Ambulatory Visit: Payer: Medicaid Other | Admitting: Obstetrics and Gynecology

## 2022-08-17 ENCOUNTER — Encounter: Payer: Self-pay | Admitting: Obstetrics and Gynecology

## 2022-08-17 VITALS — BP 122/84 | HR 69 | Wt 292.0 lb

## 2022-08-17 DIAGNOSIS — Z113 Encounter for screening for infections with a predominantly sexual mode of transmission: Secondary | ICD-10-CM | POA: Diagnosis not present

## 2022-08-17 DIAGNOSIS — N760 Acute vaginitis: Secondary | ICD-10-CM

## 2022-08-17 DIAGNOSIS — R109 Unspecified abdominal pain: Secondary | ICD-10-CM

## 2022-08-17 LAB — WET PREP FOR TRICH, YEAST, CLUE

## 2022-08-17 NOTE — Progress Notes (Signed)
GYNECOLOGY  VISIT   HPI: 47 y.o.   Single Black or African American Not Hispanic or Latino  female   [phone] with No LMP recorded. (Menstrual status: IUD).   here for test of cure for Trichomonas.      She has had a new partner since her last visit. They were sexually active last weekend. Since being sexually active she has vaginal irritation. Some mild, lower abdominal aching or cramping. She had light spotting. She doesn't have regular cycles with the IUD. Noted a mucous vaginal d/c.   GYNECOLOGIC HISTORY: No LMP recorded. (Menstrual status: IUD). Contraception:IUD Menopausal hormone therapy: none         OB History     Gravida  5   Para  2   Term  2   Preterm  0   AB  3   Living  2      SAB  0   IAB  3   Ectopic  0   Multiple  0   Live Births  2              Patient Active Problem List   Diagnosis Date Noted   Influenza 03/03/2022   Creatinine elevation 04/01/2021   Prediabetes 04/01/2021   Screening for heart disease 04/01/2021   BMI 45.0-49.9, adult (HCC) 04/01/2021   Routine general medical examination at a health care facility 02/07/2016   Essential hypertension 02/07/2016   IUD (intrauterine device) in place 02/07/2016   Obesity 12/25/2014   Screen for STD (sexually transmitted disease) 12/25/2014   Screening for cervical cancer 12/25/2014   Need for prophylactic vaccination and inoculation against influenza 12/25/2014   Encounter for health maintenance examination in adult 12/25/2014   Sleep disturbance 12/25/2014   Pelvic pain in female 12/25/2014   Encounter for surveillance of contraceptive pills 02/05/2014   Impaired fasting blood sugar 02/05/2014    Past Medical History:  Diagnosis Date   Abnormal Pap smear 2005   paps normal 2007, 2008, 2009, 2010, 2013, 2014   Arthritis    Back pain    Chlamydia 2016   Herpes gingivostomatitis    History of EKG 02/09/2009   normal   History of pregnancy    5 pregnancies, 2 live births, 3  TABs   HSV infection 2018   positive HSV I and II   Hypertension    Impaired fasting blood sugar 2015   Liver hemangioma 2008   per ultrasound and 02/2013 MRI abdomen   Obesity    Respiratory tract infection 02/07/2016    Past Surgical History:  Procedure Laterality Date   COLPOSCOPY  05/2003   INTRAUTERINE DEVICE (IUD) INSERTION  2017   Mirena   KNEE SURGERY  2000   R knee, ACL, MCL meniscus    Current Outpatient Medications  Medication Sig Dispense Refill   amLODipine (NORVASC) 10 MG tablet TAKE 1 TABLET(10 MG) BY MOUTH DAILY 90 tablet 0   ibuprofen (ADVIL) 800 MG tablet Take 1 tablet (800 mg total) by mouth every 8 (eight) hours as needed. 30 tablet 0   levonorgestrel (MIRENA) 20 MCG/24HR IUD 1 each by Intrauterine route once.     metroNIDAZOLE (FLAGYL) 500 MG tablet Take 1 tablet (500 mg total) by mouth 2 (two) times daily. 14 tablet 0   valACYclovir (VALTREX) 500 MG tablet TAKE 1 TABLET BY MOUTH daily, increase to TWICE DAILY FOR 3 DAYS AS NEEDED FOR OUTBREAK 90 tablet 2   valsartan-hydrochlorothiazide (DIOVAN-HCT) 80-12.5 MG tablet Take 1  tablet by mouth daily. 90 tablet 0   No current facility-administered medications for this visit.     ALLERGIES: Patient has no known allergies.  Family History  Problem Relation Age of Onset   Tuberculosis Father    Diabetes Father    Hypertension Father    Heart attack Father    Cancer Maternal Uncle        leukemia   Anesthesia problems Neg Hx    Heart disease Neg Hx    Hyperlipidemia Neg Hx    Stroke Neg Hx    Colon polyps Neg Hx    Colon cancer Neg Hx    Esophageal cancer Neg Hx    Rectal cancer Neg Hx    Stomach cancer Neg Hx     Social History   Socioeconomic History   Marital status: Single    Spouse name: Not on file   Number of children: Not on file   Years of education: Not on file   Highest education level: Not on file  Occupational History   Occupation: Advertising account executive    Comment: Orthoptist   Tobacco Use   Smoking status: Never   Smokeless tobacco: Never  Vaping Use   Vaping Use: Never used  Substance and Sexual Activity   Alcohol use: Yes    Comment: occ   Drug use: No   Sexual activity: Yes    Partners: Male    Birth control/protection: I.U.D.  Other Topics Concern   Not on file  Social History Narrative   Single, relationship with son's father off and on x 3 years, 2 children, ages 17yo son and 20yo daughter.  Has 1st granddaughter.  Exercise - not much.  Educational psychologist firm.  Sexually active.  No condom use.   Social Determinants of Health   Financial Resource Strain: Not on file  Food Insecurity: Not on file  Transportation Needs: Not on file  Physical Activity: Not on file  Stress: Not on file  Social Connections: Not on file  Intimate Partner Violence: Not on file    Review of Systems  All other systems reviewed and are negative.   PHYSICAL EXAMINATION:    BP 122/84   Pulse 69   Wt 292 lb (132.5 kg)   SpO2 100%   BMI 50.12 kg/m     General appearance: alert, cooperative and appears stated age  Pelvic: External genitalia:  no lesions              Urethra:  normal appearing urethra with no masses, tenderness or lesions              Bartholins and Skenes: normal                 Vagina: normal appearing vagina with normal color, slight increase in thick/white vaginal d/c              Cervix: no cervical motion tenderness, no lesions, and IUD string missing (previously in place on U/S)              Bimanual Exam:  Uterus:   no masses or tenderness              Adnexa: no mass, fullness, tenderness                Chaperone, Carolynn Serve, CMA was present for exam.  1. Screening examination for STD (sexually transmitted disease) - RPR - HIV Antibody (routine testing w rflx) - Hepatitis  C antibody - SureSwab Advanced Vaginitis Plus,TMA  2. Acute vaginitis Mild symptoms - WET PREP FOR TRICH, YEAST, CLUE - SureSwab  Advanced Vaginitis Plus,TMA  3. Abdominal cramping Mild, some spotting, could be her cycle. No signs of PID.  ~21 minutes in total patient care

## 2022-08-18 LAB — HIV ANTIBODY (ROUTINE TESTING W REFLEX): HIV 1&2 Ab, 4th Generation: NONREACTIVE

## 2022-08-18 LAB — HEPATITIS C ANTIBODY: Hepatitis C Ab: NONREACTIVE

## 2022-08-18 LAB — SURESWAB® ADVANCED VAGINITIS PLUS,TMA
C. trachomatis RNA, TMA: NOT DETECTED
CANDIDA SPECIES: NOT DETECTED
Candida glabrata: NOT DETECTED
N. gonorrhoeae RNA, TMA: NOT DETECTED
SURESWAB(R) ADV BACTERIAL VAGINOSIS(BV),TMA: NEGATIVE
TRICHOMONAS VAGINALIS (TV),TMA: NOT DETECTED

## 2022-08-18 LAB — RPR: RPR Ser Ql: NONREACTIVE

## 2022-10-14 DIAGNOSIS — Z202 Contact with and (suspected) exposure to infections with a predominantly sexual mode of transmission: Secondary | ICD-10-CM | POA: Diagnosis not present

## 2022-10-14 DIAGNOSIS — R030 Elevated blood-pressure reading, without diagnosis of hypertension: Secondary | ICD-10-CM | POA: Diagnosis not present

## 2022-10-14 DIAGNOSIS — N898 Other specified noninflammatory disorders of vagina: Secondary | ICD-10-CM | POA: Diagnosis not present

## 2022-10-14 DIAGNOSIS — Z3202 Encounter for pregnancy test, result negative: Secondary | ICD-10-CM | POA: Diagnosis not present

## 2022-10-14 DIAGNOSIS — N760 Acute vaginitis: Secondary | ICD-10-CM | POA: Diagnosis not present

## 2022-12-28 ENCOUNTER — Ambulatory Visit: Payer: Medicaid Other | Admitting: Medical

## 2022-12-28 VITALS — BP 110/68 | HR 64 | Temp 97.1°F | Wt 283.6 lb

## 2022-12-28 DIAGNOSIS — Z23 Encounter for immunization: Secondary | ICD-10-CM | POA: Diagnosis not present

## 2022-12-28 DIAGNOSIS — M549 Dorsalgia, unspecified: Secondary | ICD-10-CM | POA: Diagnosis not present

## 2022-12-28 DIAGNOSIS — R109 Unspecified abdominal pain: Secondary | ICD-10-CM | POA: Diagnosis not present

## 2022-12-28 LAB — POCT URINALYSIS DIP (PROADVANTAGE DEVICE)
Bilirubin, UA: NEGATIVE
Glucose, UA: NEGATIVE mg/dL
Ketones, POC UA: NEGATIVE mg/dL
Leukocytes, UA: NEGATIVE
Nitrite, UA: NEGATIVE
Protein Ur, POC: NEGATIVE mg/dL
Specific Gravity, Urine: 1.01
Urobilinogen, Ur: NEGATIVE
pH, UA: 7 (ref 5.0–8.0)

## 2022-12-28 LAB — POCT URINE PREGNANCY: Preg Test, Ur: NEGATIVE

## 2022-12-28 MED ORDER — TIZANIDINE HCL 4 MG PO TABS: 4.0000 mg | ORAL_TABLET | Freq: Two times a day (BID) | ORAL | 0 refills | Status: DC | PRN

## 2022-12-28 MED ORDER — HYDROCODONE-ACETAMINOPHEN 5-325 MG PO TABS: 1.0000 | ORAL_TABLET | Freq: Three times a day (TID) | ORAL | 0 refills | Status: DC | PRN

## 2022-12-28 NOTE — Patient Instructions (Signed)
It is not exactly obvious was causing your pain.  You have generalized pain but good range of motion no obvious bulging disc or nerve issue in the leg  We will call with lab results  Recommendations: Make sure you are drinking plenty of water throughout the day Over the next few days you can use a muscle relaxer tizanidine once or twice daily particularly in the evening to see if this helps.  Caution as this can cause sleepiness I also prescribed hydrocodone pain medicine for breakthrough pain or worse pain over the next few days if needed Consider over-the-counter Aleve once or twice a day just for a few days to help with pain and inflammation.  This should not be an ongoing treatment recommendation Stretch over the next few days If you have not flipped her mattress over , its helpful to flip you mattress periodically If any new or worse symptoms over the next few days let me know If not improving over the next 3 to 5 days then a baseline x-ray would be appropriate Consider massage therapy

## 2022-12-28 NOTE — Progress Notes (Signed)
Subjective:  Joanne Johns is a 47 y.o. female who presents for Chief Complaint  Patient presents with   back pain    [place] Back pain with pain going to her abdomen area. X 2 weeks     Here for 2 weeks of back pain not resolving.  Took some tylenol and ibuprofen without relief.    Pain in both sides, lower to mid back pain, some lower abdominal pain  No radiating pain down legs, no numbness, no tingling, no leg weakness. No fever, no body ache or chills.  No urinary frequency, urgency or blood in urine.    No bowel changes  No recent changes in activity or heavy lifting to prompt the pain.    She would like a flu and COVID shot today  No other aggravating or relieving factors.    No other c/o.  Past Medical History:  Diagnosis Date   Abnormal Pap smear 2005   paps normal 2007, 2008, 2009, 2010, 2013, 2014   Arthritis    Back pain    Chlamydia 2016   Herpes gingivostomatitis    History of EKG 02/09/2009   normal   History of pregnancy    5 pregnancies, 2 live births, 3 TABs   HSV infection 2018   positive HSV I and II   Hypertension    Impaired fasting blood sugar 2015   Liver hemangioma 2008   per ultrasound and 02/2013 MRI abdomen   Obesity    Respiratory tract infection 02/07/2016   Past Surgical History:  Procedure Laterality Date   COLPOSCOPY  05/2003   INTRAUTERINE DEVICE (IUD) INSERTION  2017   Mirena   KNEE SURGERY  2000   R knee, ACL, MCL meniscus   Current Outpatient Medications on File Prior to Visit  Medication Sig Dispense Refill   amLODipine (NORVASC) 10 MG tablet TAKE 1 TABLET(10 MG) BY MOUTH DAILY 90 tablet 0   ibuprofen (ADVIL) 800 MG tablet Take 1 tablet (800 mg total) by mouth every 8 (eight) hours as needed. 30 tablet 0   levonorgestrel (MIRENA) 20 MCG/24HR IUD 1 each by Intrauterine route once.     valACYclovir (VALTREX) 500 MG tablet TAKE 1 TABLET BY MOUTH daily, increase to TWICE DAILY FOR 3 DAYS AS NEEDED FOR OUTBREAK 90 tablet 2    valsartan-hydrochlorothiazide (DIOVAN-HCT) 80-12.5 MG tablet Take 1 tablet by mouth daily. 90 tablet 0   No current facility-administered medications on file prior to visit.     The following portions of the patient's history were reviewed and updated as appropriate: allergies, current medications, past family history, past medical history, past social history, past surgical history and problem list.  ROS Otherwise as in subjective above   Objective: BP 110/68   Pulse 64   Temp (!) 97.1 F (36.2 C)   Wt 283 lb 9.6 oz (128.6 kg)   BMI 48.68 kg/m   General appearance: alert, no distress, well developed, well nourished Abdomen: +bs, soft, mild LUQ and epigastric tendnerss, otherwise non tender, non distended, no masses, no hepatomegaly, no splenomegaly Back with generalized tenderness mid to low back left and right in midline, range of motion normal, no swelling or deformity MSK: Legs nontender with normal range of motion of hips Lower extremity strength and sensation and DTRs normal, negative straight leg raise, normal heel and toe walk Pulses: 2+ radial pulses, 2+ pedal pulses, normal cap refill Ext: no edema     Assessment: Encounter Diagnoses  Name Primary?  Acute bilateral back pain, unspecified back location Yes   Abdominal discomfort    Needs flu shot    COVID-19 vaccine administered      Plan: Back pain, abdominal discomfort, vague - discussed possible differential.  See instructions below  Counseled on the influenza virus vaccine.  Vaccine information sheet given.  Influenza vaccine given after consent obtained.  Counseled on the Covid virus vaccine.  Vaccine information sheet given.  Covid vaccine given after consent obtained.  Patient Instructions  It is not exactly obvious was causing your pain.  You have generalized pain but good range of motion no obvious bulging disc or nerve issue in the leg  We will call with lab results  Recommendations: Make  sure you are drinking plenty of water throughout the day Over the next few days you can use a muscle relaxer tizanidine once or twice daily particularly in the evening to see if this helps.  Caution as this can cause sleepiness I also prescribed hydrocodone pain medicine for breakthrough pain or worse pain over the next few days if needed Consider over-the-counter Aleve once or twice a day just for a few days to help with pain and inflammation.  This should not be an ongoing treatment recommendation Stretch over the next few days If you have not flipped her mattress over , its helpful to flip you mattress periodically If any new or worse symptoms over the next few days let me know If not improving over the next 3 to 5 days then a baseline x-ray would be appropriate Consider massage therapy  Sibley was seen today for back pain.  Diagnoses and all orders for this visit:  Acute bilateral back pain, unspecified back location -     POCT Urinalysis DIP (Proadvantage Device) -     POCT urine pregnancy -     Basic metabolic panel -     CBC  Abdominal discomfort -     POCT Urinalysis DIP (Proadvantage Device) -     POCT urine pregnancy -     Basic metabolic panel -     CBC  Needs flu shot -     Flu vaccine trivalent PF, 6mos and older(Flulaval,Afluria,Fluarix,Fluzone)  COVID-19 vaccine administered -     Pfizer Comirnaty Covid -19 Vaccine 49yrs and older  Other orders -     tiZANidine (ZANAFLEX) 4 MG tablet; Take 1 tablet (4 mg total) by mouth 2 (two) times daily as needed for muscle spasms. -     HYDROcodone-acetaminophen (NORCO) 5-325 MG tablet; Take 1 tablet by mouth 3 (three) times daily as needed.    Follow up: pending labs

## 2022-12-28 NOTE — Progress Notes (Signed)
Send urine for culture.

## 2022-12-28 NOTE — Addendum Note (Signed)
Addended by: Herminio Commons A on: 12/28/2022 09:35 AM   Modules accepted: Orders

## 2022-12-29 ENCOUNTER — Other Ambulatory Visit: Payer: Self-pay | Admitting: Medical

## 2022-12-29 LAB — CBC
Hematocrit: 42.5 % (ref 34.0–46.6)
Hemoglobin: 13.8 g/dL (ref 11.1–15.9)
MCH: 30.1 pg (ref 26.6–33.0)
MCHC: 32.5 g/dL (ref 31.5–35.7)
MCV: 93 fL (ref 79–97)
Platelets: 355 x10E3/uL (ref 150–450)
RBC: 4.59 x10E6/uL (ref 3.77–5.28)
RDW: 13 % (ref 11.7–15.4)
WBC: 7.1 x10E3/uL (ref 3.4–10.8)

## 2022-12-29 LAB — BASIC METABOLIC PANEL WITH GFR
BUN/Creatinine Ratio: 12 (ref 9–23)
BUN: 14 mg/dL (ref 6–24)
CO2: 23 mmol/L (ref 20–29)
Calcium: 9.5 mg/dL (ref 8.7–10.2)
Chloride: 102 mmol/L (ref 96–106)
Creatinine, Ser: 1.16 mg/dL — ABNORMAL HIGH (ref 0.57–1.00)
Glucose: 88 mg/dL (ref 70–99)
Potassium: 5 mmol/L (ref 3.5–5.2)
Sodium: 141 mmol/L (ref 134–144)
eGFR: 59 mL/min/1.73 — ABNORMAL LOW

## 2022-12-29 MED ORDER — AMLODIPINE BESYLATE 10 MG PO TABS: ORAL_TABLET | ORAL | 0 refills | Status: DC

## 2022-12-29 NOTE — Progress Notes (Signed)
Results sent through MyChart

## 2022-12-30 LAB — URINE CULTURE: Organism ID, Bacteria: NO GROWTH

## 2022-12-31 NOTE — Progress Notes (Signed)
Results sent through MyChart

## 2023-01-03 ENCOUNTER — Other Ambulatory Visit: Payer: Self-pay | Admitting: Medical

## 2023-01-03 MED ORDER — VALSARTAN 80 MG PO TABS: 80.0000 mg | ORAL_TABLET | Freq: Every day | ORAL | 1 refills | Status: DC

## 2023-01-09 ENCOUNTER — Ambulatory Visit: Payer: Medicaid Other | Admitting: Medical

## 2023-01-09 ENCOUNTER — Encounter: Payer: Self-pay | Admitting: Medical

## 2023-01-09 VITALS — BP 118/74 | HR 78 | Ht 64.5 in | Wt 288.2 lb

## 2023-01-09 DIAGNOSIS — R7301 Impaired fasting glucose: Secondary | ICD-10-CM | POA: Diagnosis not present

## 2023-01-09 DIAGNOSIS — R7303 Prediabetes: Secondary | ICD-10-CM

## 2023-01-09 DIAGNOSIS — Z975 Presence of (intrauterine) contraceptive device: Secondary | ICD-10-CM | POA: Diagnosis not present

## 2023-01-09 DIAGNOSIS — Z13220 Encounter for screening for lipoid disorders: Secondary | ICD-10-CM

## 2023-01-09 DIAGNOSIS — Z0000 Encounter for general adult medical examination without abnormal findings: Secondary | ICD-10-CM

## 2023-01-09 DIAGNOSIS — Z1231 Encounter for screening mammogram for malignant neoplasm of breast: Secondary | ICD-10-CM

## 2023-01-09 DIAGNOSIS — I10 Essential (primary) hypertension: Secondary | ICD-10-CM

## 2023-01-09 MED ORDER — WEGOVY 0.5 MG/0.5ML ~~LOC~~ SOAJ: 0.5000 mg | SUBCUTANEOUS | 0 refills | Status: DC

## 2023-01-09 MED ORDER — WEGOVY 0.25 MG/0.5ML ~~LOC~~ SOAJ: 0.2500 mg | SUBCUTANEOUS | 0 refills | Status: DC

## 2023-01-09 NOTE — Patient Instructions (Signed)
This visit was a preventative care visit, also known as wellness visit or routine physical.   Topics typically include healthy lifestyle, diet, exercise, preventative care, vaccinations, sick and well care, proper use of emergency dept and after hours care, as well as other concerns.    Separate significant issues discussed: Hypertension continue amlodipine 10 mg daily.  As mentioned after last visit.  Your combo blood pressure pill and changed to valsartan 80 mg without the HCT piece.  We will need to recheck your blood pressure and kidney marker in a few month or 2  BMI greater than 40-begin trial of Wegovy 0.25 mg weekly for the first month, then go up to 0.5 mg weekly.  I will need to see you back in about 6 to 8 weeks after starting this medication.  Try to exercise 4 to 5 days/week.    General Recommendations: Continue to return yearly for your annual wellness and preventative care visits.  This gives [place] a chance to discuss healthy lifestyle, exercise, vaccinations, review your chart record, and perform screenings where appropriate.  I recommend you see your eye doctor yearly for routine vision care.  I recommend you see your dentist yearly for routine dental care including hygiene visits twice yearly.   Vaccination recommendations were reviewed Immunization History  Administered Date(s) Administered   DTaP 12/14/1976, 01/13/1977, 02/01/1978, 12/21/1981   HPV 9-valent 03/03/2020   IPV 12/14/1976, 01/13/1977, 02/01/1978, 12/21/1981   Influenza, Seasonal, Injecte, Preservative Fre 12/28/2022   Influenza,inj,Quad PF,6+ Mos 02/04/2013, 02/05/2014, 12/25/2014   MMR 02/01/1978, 10/28/1997   Meningococcal polysaccharide vaccine (MPSV4) 04/01/2008   PFIZER(Purple Top)SARS-COV-2 Vaccination 06/16/2019, 07/07/2019   PPD Test 04/01/2008   Pfizer(Comirnaty)Fall Seasonal Vaccine 12 years and older 12/28/2022   Td 08/24/2005   Tdap 05/04/2011    Vaccine  recommendations: Tdap  Vaccines administered today: declined   Screening for cancer: Colon cancer screening: Prior or last colon cancer screen: 2023 reviewed, repeat 10 years   Breast cancer screening: You should perform a self breast exam monthly.   We reviewed recommendations for regular mammograms and breast cancer screening. Last mammogram: not up to date  Please call to schedule your mammogram.   [facility]  [phone] [address], [address] [place], [place] [address]   Cervical cancer screening: We reviewed recommendations for pap smear screening. Last pap reviewed from 03/2022   Skin cancer screening: Check your skin regularly for new changes, growing lesions, or other lesions of concern Come in for evaluation if you have skin lesions of concern.  Lung cancer screening: If you have a greater than 20 pack year history of tobacco use, then you may qualify for lung cancer screening with a chest CT scan.   Please call your insurance company to inquire about coverage for this test.  Pancreatic cancer: no current screening test is available routinely recommended.  (Risk factors: Smoking, overweight or obese, diabetes, chronic pancreatitis, work Nurse, mental health, Solicitor, 61 year old or greater, female greater than female, African-American, family history of pancreatic cancer, hereditary breast, ovarian, melanoma, Lynch, Peutz-jeghers).  We currently don't have screenings for other cancers besides breast, cervical, colon, and lung cancers.  If you have a strong family history of cancer or have other cancer screening concerns, please let me know.    Bone health: Get at least 150 minutes of aerobic exercise weekly Get weight bearing exercise at least once weekly Bone density test:  A bone density test is an imaging test that uses a type  of X-ray to measure the amount of calcium and other minerals in your bones. The test may be  used to diagnose or screen you for a condition that causes weak or thin bones (osteoporosis), predict your risk for a broken bone (fracture), or determine how well your osteoporosis treatment is working. The bone density test is recommended for females 65 and older, or females or males <65 if certain risk factors such as thyroid disease, long term use of steroids such as for asthma or rheumatological issues, vitamin D deficiency, estrogen deficiency, family history of osteoporosis, self or family history of fragility fracture in first degree relative.    Heart health: Get at least 150 minutes of aerobic exercise weekly Limit alcohol It is important to maintain a healthy blood pressure and healthy cholesterol numbers  Heart disease screening: Screening for heart disease includes screening for blood pressure, fasting lipids, glucose/diabetes screening, BMI height to weight ratio, reviewed of smoking status, physical activity, and diet.    Goals include blood pressure 120/80 or less, maintaining a healthy lipid/cholesterol profile, preventing diabetes or keeping diabetes numbers under good control, not smoking or using tobacco products, exercising most days per week or at least 150 minutes per week of exercise, and eating healthy variety of fruits and vegetables, healthy oils, and avoiding unhealthy food choices like fried food, fast food, high sugar and high cholesterol foods.    Other tests may possibly include EKG test, CT coronary calcium score, echocardiogram, exercise treadmill stress test.   Of note, CT chest 9/23 showed no atherosclerosis.   Vascular disease screening: For high risk individuals including smokers, diabetes, patients with known heart disease or high blood pressure, kidney disease, and others, screening for vascular disease or atherosclerosis of the arteries is available.  Examples may include carotid ultrasound, abdominal aortic ultrasound, ABI blood flow screening in the  legs, thoracic aorta screening.   Medical care options: I recommend you continue to seek care here first for routine care.  We try really hard to have available appointments Monday through Friday daytime hours for sick visits, acute visits, and physicals.  Urgent care should be used for after hours and weekends for significant issues that cannot wait till the next day.  The emergency department should be used for significant potentially life-threatening emergencies.  The emergency department is expensive, can often have long wait times for less significant concerns, so try to utilize primary care, urgent care, or telemedicine when possible to avoid unnecessary trips to the emergency department.  Virtual visits and telemedicine have been introduced since the pandemic started in 2020, and can be convenient ways to receive medical care.  We offer virtual appointments as well to assist you in a variety of options to seek medical care.   Legal Take the time to do a Last Will and Testament, advanced directives including Healthcare Power of Attorney and Living Will documents.  Do not leave your family with burdens that can be handled ahead of time.   Advanced Directives: I recommend you consider completing a Health Care Power of Attorney and Living Will.   These documents respect your wishes and help alleviate burdens on your loved ones if you were to become terminally ill or be in a position to need those documents enforced.    You can complete Advanced Directives yourself, have them notarized, then have copies made for our office, for you and for anybody you feel should have them in safe keeping.  Or, you can have an attorney prepare  these documents.   If you haven't updated your Last Will and Testament in a while, it may be worthwhile having an attorney prepare these documents together and save on some costs.       Spiritual and Emotional Health Keeping a healthy spiritual life can help you better  manage your physical health. Your spiritual life can help you to cope with any issues that may arise with your physical health.  Balance can keep [place] healthy and help [place] to recover.  If you are struggling with your spiritual health there are questions that you may want to ask yourself:  What makes me feel most complete? When do I feel most connected to the rest of the world? Where do I find the most inner strength? What am I doing when I feel whole?  Helpful tips: Being in nature. Some people feel very connected and at peace when they are walking outdoors or are outside. Helping others. Some feel the largest sense of wellbeing when they are of service to others. Being of service can take on many forms. It can be doing volunteer work, being kind to strangers, or offering a hand to a friend in need. Gratitude. Some people find they feel the most connected when they remain grateful. They may make lists of all the things they are grateful for or say a thank you out loud for all they have.    Emotional Health Are you in tune with your emotional health?  Check out this link: http://www.marquez-love.com/   Financial Health Make sure you use a budget for your personal finances Make sure you are insured against risks (health insurance, life insurance, auto insurance, etc) Save more, spend less Set financial goals If you need help in this area, good resources include counseling through [employer] or other community resources, have a meeting with a Social research officer, government, and a good resource is [employer]

## 2023-01-09 NOTE — Progress Notes (Signed)
Subjective:   HPI  Joanne Johns is a 47 y.o. female who presents for Chief Complaint  Patient presents with   Annual Exam    Needs a referral for mammogram. Gyn retired.     Patient Care Team: Calinda Stockinger, Cleda Mccreedy as PCP - General (Family Medicine) Corky Crafts, MD as PCP - Cardiology (Cardiology) [facility], [facility] Gertie Exon, gyn Dr. Carmelia Roller, GI   Concerns: Doing fine today.  She has not started the plain valsartan from her recent visit.  No other new complaints.   Reviewed their medical, surgical, family, social, medication, and allergy history and updated chart as appropriate.  No Known Allergies  Past Medical History:  Diagnosis Date   Abnormal Pap smear 2005   paps normal 2007, 2008, 2009, 2010, 2013, 2014   Arthritis    Back pain    Chlamydia 2016   Herpes gingivostomatitis    History of EKG 02/09/2009   normal   History of pregnancy    5 pregnancies, 2 live births, 3 TABs   HSV infection 2018   positive HSV I and II   Hypertension    Impaired fasting blood sugar 2015   Liver hemangioma 2008   per ultrasound and 02/2013 MRI abdomen   Obesity    Respiratory tract infection 02/07/2016    Current Outpatient Medications on File Prior to Visit  Medication Sig Dispense Refill   amLODipine (NORVASC) 10 MG tablet TAKE 1 TABLET(10 MG) BY MOUTH DAILY 90 tablet 0   levonorgestrel (MIRENA) 20 MCG/24HR IUD 1 each by Intrauterine route once.     valsartan (DIOVAN) 80 MG tablet Take 1 tablet (80 mg total) by mouth daily. 90 tablet 1   HYDROcodone-acetaminophen (NORCO) 5-325 MG tablet Take 1 tablet by mouth 3 (three) times daily as needed. (Patient not taking: Reported on 01/09/2023) 10 tablet 0   tiZANidine (ZANAFLEX) 4 MG tablet Take 1 tablet (4 mg total) by mouth 2 (two) times daily as needed for muscle spasms. (Patient not taking: Reported on 01/09/2023) 20 tablet 0   valACYclovir (VALTREX) 500 MG tablet TAKE 1 TABLET BY MOUTH  daily, increase to TWICE DAILY FOR 3 DAYS AS NEEDED FOR OUTBREAK (Patient not taking: Reported on 01/09/2023) 90 tablet 2   No current facility-administered medications on file prior to visit.      Current Outpatient Medications:    amLODipine (NORVASC) 10 MG tablet, TAKE 1 TABLET(10 MG) BY MOUTH DAILY, Disp: 90 tablet, Rfl: 0   levonorgestrel (MIRENA) 20 MCG/24HR IUD, 1 each by Intrauterine route once., Disp: , Rfl:    Semaglutide-Weight Management (WEGOVY) 0.25 MG/0.5ML SOAJ, Inject 0.25 mg into the skin once a week., Disp: 2 mL, Rfl: 0   Semaglutide-Weight Management (WEGOVY) 0.5 MG/0.5ML SOAJ, Inject 0.5 mg into the skin once a week., Disp: 2 mL, Rfl: 0   valsartan (DIOVAN) 80 MG tablet, Take 1 tablet (80 mg total) by mouth daily., Disp: 90 tablet, Rfl: 1   HYDROcodone-acetaminophen (NORCO) 5-325 MG tablet, Take 1 tablet by mouth 3 (three) times daily as needed. (Patient not taking: Reported on 01/09/2023), Disp: 10 tablet, Rfl: 0   tiZANidine (ZANAFLEX) 4 MG tablet, Take 1 tablet (4 mg total) by mouth 2 (two) times daily as needed for muscle spasms. (Patient not taking: Reported on 01/09/2023), Disp: 20 tablet, Rfl: 0   valACYclovir (VALTREX) 500 MG tablet, TAKE 1 TABLET BY MOUTH daily, increase to TWICE DAILY FOR 3 DAYS AS NEEDED FOR OUTBREAK (  Patient not taking: Reported on 01/09/2023), Disp: 90 tablet, Rfl: 2  Family History  Problem Relation Age of Onset   Tuberculosis Father    Diabetes Father    Hypertension Father    Heart attack Father    Cancer Maternal Uncle        leukemia   Anesthesia problems Neg Hx    Heart disease Neg Hx    Hyperlipidemia Neg Hx    Stroke Neg Hx    Colon polyps Neg Hx    Colon cancer Neg Hx    Esophageal cancer Neg Hx    Rectal cancer Neg Hx    Stomach cancer Neg Hx     Past Surgical History:  Procedure Laterality Date   COLPOSCOPY  05/2003   INTRAUTERINE DEVICE (IUD) INSERTION  2017   Mirena   KNEE SURGERY  2000   R knee, ACL, MCL  meniscus   Review of Systems  Constitutional:  Negative for chills, fever, malaise/fatigue and weight loss.  HENT:  Negative for congestion, ear pain, hearing loss, sore throat and tinnitus.   Eyes:  Negative for blurred vision, pain and redness.  Respiratory:  Negative for cough, hemoptysis and shortness of breath.   Cardiovascular:  Negative for chest pain, palpitations, orthopnea, claudication and leg swelling.  Gastrointestinal:  Negative for abdominal pain, blood in stool, constipation, diarrhea, nausea and vomiting.  Genitourinary:  Negative for dysuria, flank pain, frequency, hematuria and urgency.  Musculoskeletal:  Negative for falls, joint pain and myalgias.  Skin:  Negative for itching and rash.  Neurological:  Negative for dizziness, tingling, speech change, weakness and headaches.  Endo/Heme/Allergies:  Negative for polydipsia. Does not bruise/bleed easily.  Psychiatric/Behavioral:  Negative for depression and memory loss. The patient is not nervous/anxious and does not have insomnia.         Objective:  BP 118/74   Pulse 78   Ht 5' 4.5" (1.638 m)   Wt 288 lb 3.2 oz (130.7 kg)   SpO2 96%   BMI 48.71 kg/m   General appearance: alert, no distress, WD/WN, African American female Skin: unremarkable HEENT: normocephalic, conjunctiva/corneas normal, sclerae anicteric, PERRLA, EOMi, nares patent, no discharge or erythema, pharynx normal Oral cavity: MMM, tongue normal, teeth normal Neck: supple, no lymphadenopathy, no thyromegaly, no masses, normal ROM, no bruits Chest: non tender, normal shape and expansion Heart: RRR, normal S1, S2, no murmurs Lungs: CTA bilaterally, no wheezes, rhonchi, or rales Abdomen: +bs, soft, non tender, non distended, no masses, no hepatomegaly, no splenomegaly, no bruits Back: non tender, normal ROM, no scoliosis Musculoskeletal: upper extremities non tender, no obvious deformity, normal ROM throughout, lower extremities non tender, no obvious  deformity, normal ROM throughout Extremities: no edema, no cyanosis, no clubbing Pulses: 2+ symmetric, upper and lower extremities, normal cap refill Neurological: alert, oriented x 3, CN2-12 intact, strength normal upper extremities and lower extremities, sensation normal throughout, DTRs 2+ throughout, no cerebellar signs, gait normal Psychiatric: normal affect, behavior normal, pleasant  Breast/gyn/rectal - deferred to gynecology     Assessment and Plan :   Encounter Diagnoses  Name Primary?   Encounter for health maintenance examination in adult Yes   Essential hypertension    Impaired fasting blood sugar    IUD (intrauterine device) in place    Prediabetes    Encounter for screening mammogram for malignant neoplasm of breast    Screening for lipid disorders      This visit was a preventative care visit, also known as wellness visit  or routine physical.   Topics typically include healthy lifestyle, diet, exercise, preventative care, vaccinations, sick and well care, proper use of emergency dept and after hours care, as well as other concerns.    Separate significant issues discussed: Hypertension continue amlodipine 10 mg daily.  As mentioned after last visit.  Your combo blood pressure pill and changed to valsartan 80 mg without the HCT piece.  We will need to recheck your blood pressure and kidney marker in a few month or 2  BMI greater than 40-begin trial of Wegovy 0.25 mg weekly for the first month, then go up to 0.5 mg weekly.  I will need to see you back in about 6 to 8 weeks after starting this medication.  Try to exercise 4 to 5 days/week.    General Recommendations: Continue to return yearly for your annual wellness and preventative care visits.  This gives [place] a chance to discuss healthy lifestyle, exercise, vaccinations, review your chart record, and perform screenings where appropriate.  I recommend you see your eye doctor yearly for routine vision care.  I  recommend you see your dentist yearly for routine dental care including hygiene visits twice yearly.   Vaccination recommendations were reviewed Immunization History  Administered Date(s) Administered   DTaP 12/14/1976, 01/13/1977, 02/01/1978, 12/21/1981   HPV 9-valent 03/03/2020   IPV 12/14/1976, 01/13/1977, 02/01/1978, 12/21/1981   Influenza, Seasonal, Injecte, Preservative Fre 12/28/2022   Influenza,inj,Quad PF,6+ Mos 02/04/2013, 02/05/2014, 12/25/2014   MMR 02/01/1978, 10/28/1997   Meningococcal polysaccharide vaccine (MPSV4) 04/01/2008   PFIZER(Purple Top)SARS-COV-2 Vaccination 06/16/2019, 07/07/2019   PPD Test 04/01/2008   Pfizer(Comirnaty)Fall Seasonal Vaccine 12 years and older 12/28/2022   Td 08/24/2005   Tdap 05/04/2011    Vaccine recommendations: Tdap  Vaccines administered today: declined   Screening for cancer: Colon cancer screening: Prior or last colon cancer screen: 2023 reviewed, repeat 10 years   Breast cancer screening: You should perform a self breast exam monthly.   We reviewed recommendations for regular mammograms and breast cancer screening. Last mammogram: not up to date  Please call to schedule your mammogram.   [facility]  [phone] [address], [address] [place], [place] [address]   Cervical cancer screening: We reviewed recommendations for pap smear screening. Last pap reviewed from 03/2022   Skin cancer screening: Check your skin regularly for new changes, growing lesions, or other lesions of concern Come in for evaluation if you have skin lesions of concern.  Lung cancer screening: If you have a greater than 20 pack year history of tobacco use, then you may qualify for lung cancer screening with a chest CT scan.   Please call your insurance company to inquire about coverage for this test.  Pancreatic cancer: no current screening test is available routinely recommended.  (Risk factors: Smoking,  overweight or obese, diabetes, chronic pancreatitis, work Nurse, mental health, Solicitor, 54 year old or greater, female greater than female, African-American, family history of pancreatic cancer, hereditary breast, ovarian, melanoma, Lynch, Peutz-jeghers).  We currently don't have screenings for other cancers besides breast, cervical, colon, and lung cancers.  If you have a strong family history of cancer or have other cancer screening concerns, please let me know.    Bone health: Get at least 150 minutes of aerobic exercise weekly Get weight bearing exercise at least once weekly Bone density test:  A bone density test is an imaging test that uses a type of X-ray to measure the amount of calcium and other minerals in  your bones. The test may be used to diagnose or screen you for a condition that causes weak or thin bones (osteoporosis), predict your risk for a broken bone (fracture), or determine how well your osteoporosis treatment is working. The bone density test is recommended for females 65 and older, or females or males <65 if certain risk factors such as thyroid disease, long term use of steroids such as for asthma or rheumatological issues, vitamin D deficiency, estrogen deficiency, family history of osteoporosis, self or family history of fragility fracture in first degree relative.    Heart health: Get at least 150 minutes of aerobic exercise weekly Limit alcohol It is important to maintain a healthy blood pressure and healthy cholesterol numbers  Heart disease screening: Screening for heart disease includes screening for blood pressure, fasting lipids, glucose/diabetes screening, BMI height to weight ratio, reviewed of smoking status, physical activity, and diet.    Goals include blood pressure 120/80 or less, maintaining a healthy lipid/cholesterol profile, preventing diabetes or keeping diabetes numbers under good control, not smoking or using tobacco products, exercising most  days per week or at least 150 minutes per week of exercise, and eating healthy variety of fruits and vegetables, healthy oils, and avoiding unhealthy food choices like fried food, fast food, high sugar and high cholesterol foods.    Other tests may possibly include EKG test, CT coronary calcium score, echocardiogram, exercise treadmill stress test.   Of note, CT chest 9/23 showed no atherosclerosis.   Vascular disease screening: For high risk individuals including smokers, diabetes, patients with known heart disease or high blood pressure, kidney disease, and others, screening for vascular disease or atherosclerosis of the arteries is available.  Examples may include carotid ultrasound, abdominal aortic ultrasound, ABI blood flow screening in the legs, thoracic aorta screening.   Medical care options: I recommend you continue to seek care here first for routine care.  We try really hard to have available appointments Monday through Friday daytime hours for sick visits, acute visits, and physicals.  Urgent care should be used for after hours and weekends for significant issues that cannot wait till the next day.  The emergency department should be used for significant potentially life-threatening emergencies.  The emergency department is expensive, can often have long wait times for less significant concerns, so try to utilize primary care, urgent care, or telemedicine when possible to avoid unnecessary trips to the emergency department.  Virtual visits and telemedicine have been introduced since the pandemic started in 2020, and can be convenient ways to receive medical care.  We offer virtual appointments as well to assist you in a variety of options to seek medical care.   Legal Take the time to do a Last Will and Testament, advanced directives including Healthcare Power of Attorney and Living Will documents.  Do not leave your family with burdens that can be handled ahead of time.   Advanced  Directives: I recommend you consider completing a Health Care Power of Attorney and Living Will.   These documents respect your wishes and help alleviate burdens on your loved ones if you were to become terminally ill or be in a position to need those documents enforced.    You can complete Advanced Directives yourself, have them notarized, then have copies made for our office, for you and for anybody you feel should have them in safe keeping.  Or, you can have an attorney prepare these documents.   If you haven't updated your Last Will and  Testament in a while, it may be worthwhile having an attorney prepare these documents together and save on some costs.       Spiritual and Emotional Health Keeping a healthy spiritual life can help you better manage your physical health. Your spiritual life can help you to cope with any issues that may arise with your physical health.  Balance can keep [place] healthy and help [place] to recover.  If you are struggling with your spiritual health there are questions that you may want to ask yourself:  What makes me feel most complete? When do I feel most connected to the rest of the world? Where do I find the most inner strength? What am I doing when I feel whole?  Helpful tips: Being in nature. Some people feel very connected and at peace when they are walking outdoors or are outside. Helping others. Some feel the largest sense of wellbeing when they are of service to others. Being of service can take on many forms. It can be doing volunteer work, being kind to strangers, or offering a hand to a friend in need. Gratitude. Some people find they feel the most connected when they remain grateful. They may make lists of all the things they are grateful for or say a thank you out loud for all they have.    Emotional Health Are you in tune with your emotional health?  Check out this link: [email or web address]-love.com/   Financial Health Make sure you use a  budget for your personal finances Make sure you are insured against risks (health insurance, life insurance, auto insurance, etc) Save more, spend less Set financial goals If you need help in this area, good resources include counseling through [employer] or other community resources, have a meeting with a Social research officer, government, and a good resource is Counselling psychologist was seen today for annual exam.  Diagnoses and all orders for this visit:  Encounter for health maintenance examination in adult -     MM 3D SCREENING MAMMOGRAM BILATERAL BREAST -     Hepatic function panel -     Hemoglobin A1c -     Lipid panel -     VITAMIN D 25 Hydroxy (Vit-D Deficiency, Fractures) -     Microalbumin/Creatinine Ratio, Urine  Essential hypertension -     Microalbumin/Creatinine Ratio, Urine  Impaired fasting blood sugar -     Hemoglobin A1c  IUD (intrauterine device) in place  Prediabetes  Encounter for screening mammogram for malignant neoplasm of breast -     MM 3D SCREENING MAMMOGRAM BILATERAL BREAST  Screening for lipid disorders -     Lipid panel  Other orders -     Semaglutide-Weight Management (WEGOVY) 0.25 MG/0.5ML SOAJ; Inject 0.25 mg into the skin once a week. -     Semaglutide-Weight Management (WEGOVY) 0.5 MG/0.5ML SOAJ; Inject 0.5 mg into the skin once a week.    Follow-up pending labs, yearly for physical

## 2023-01-10 ENCOUNTER — Other Ambulatory Visit: Payer: Self-pay | Admitting: Medical

## 2023-01-10 LAB — MICROALBUMIN / CREATININE URINE RATIO
Creatinine, Urine: 46.2 mg/dL
Microalb/Creat Ratio: <6 mg/g{creat} (ref 0–29)
Microalbumin, Urine: <3 ug/mL

## 2023-01-10 LAB — HEPATIC FUNCTION PANEL
ALT: 16 [IU]/L (ref 0–32)
AST: 18 IU/L (ref 0–40)
Albumin: 4.1 g/dL (ref 3.9–4.9)
Alkaline Phosphatase: 64 IU/L (ref 44–121)
Bilirubin Total: 0.3 mg/dL (ref 0.0–1.2)
Bilirubin, Direct: <0.1 mg/dL (ref 0.00–0.40)
Total Protein: 6.3 g/dL (ref 6.0–8.5)

## 2023-01-10 LAB — LIPID PANEL
Chol/HDL Ratio: 2.5 ratio (ref 0.0–4.4)
Cholesterol, Total: 204 mg/dL — ABNORMAL HIGH (ref 100–199)
HDL: 83 mg/dL (ref >39)
LDL Chol Calc (NIH): 108 mg/dL — ABNORMAL HIGH (ref 0–99)
Triglycerides: 71 mg/dL (ref 0–149)
VLDL Cholesterol Cal: 13 mg/dL (ref 5–40)

## 2023-01-10 LAB — VITAMIN D 25 HYDROXY (VIT D DEFICIENCY, FRACTURES): Vit D, 25-Hydroxy: 17.6 ng/mL — ABNORMAL LOW (ref 30.0–100.0)

## 2023-01-10 LAB — HEMOGLOBIN A1C
Est. average glucose Bld gHb Est-mCnc: 117 mg/dL
Hgb A1c MFr Bld: 5.7 % — ABNORMAL HIGH (ref 4.8–5.6)

## 2023-01-10 MED ORDER — VITAMIN D (ERGOCALCIFEROL) 1.25 MG (50000 UNIT) PO CAPS
50000.0000 [IU] | ORAL_CAPSULE | ORAL | 3 refills | Status: DC
Start: 2023-01-10 — End: 2024-01-09

## 2023-01-10 MED ORDER — AMLODIPINE BESYLATE 10 MG PO TABS: ORAL_TABLET | ORAL | 3 refills | Status: DC

## 2023-01-10 NOTE — Progress Notes (Signed)
Results sent through MyChart

## 2023-01-12 ENCOUNTER — Telehealth: Payer: Self-pay

## 2023-01-12 NOTE — Telephone Encounter (Signed)
Key: [facility] Rx #: [id number] Drug: AVWUJW 0.25MG /0.5ML auto-injectors Form: [facility] Electronic [place] Form [phone] NCPDP) Determination: Wait for Determination

## 2023-01-15 NOTE — Telephone Encounter (Signed)
Pt notified via phone. Approval faxed to pharmacy.

## 2023-01-15 NOTE — Telephone Encounter (Signed)
Key: [facility] Rx #: [id number] Drug: AVWUJW 0.25MG /0.5ML auto-injectors Form: [facility] Electronic [place] Form [phone] NCPDP) Determination: Favorable  Your prior authorization for Reginal Lutes has been approved!  PA Case: [id number],  Status: Approved,  Coverage Starts on: 01/12/2023 12:00:00 AM,  Coverage Ends on: 07/11/2023 12:00:00 AM..  Authorization Expiration Date: July 11, 2023.

## 2023-02-01 ENCOUNTER — Ambulatory Visit
Admission: RE | Admit: 2023-02-01 | Discharge: 2023-02-01 | Disposition: A | Discharge status: Home / Self Care | Payer: Medicaid Other | Source: Ambulatory Visit | Attending: Medical | Admitting: Medical

## 2023-02-01 DIAGNOSIS — Z1231 Encounter for screening mammogram for malignant neoplasm of breast: Secondary | ICD-10-CM | POA: Diagnosis not present

## 2023-02-06 NOTE — Progress Notes (Signed)
Results sent through MyChart

## 2023-03-08 ENCOUNTER — Other Ambulatory Visit: Payer: Self-pay | Admitting: Medical

## 2023-03-08 MED ORDER — WEGOVY 1 MG/0.5ML ~~LOC~~ SOAJ: 1.0000 mg | SUBCUTANEOUS | 0 refills | Status: DC

## 2023-03-12 ENCOUNTER — Encounter: Payer: Medicaid Other | Admitting: Medical

## 2023-03-28 ENCOUNTER — Ambulatory Visit: Payer: Medicaid Other | Admitting: Medical

## 2023-03-28 VITALS — BP 124/80 | HR 72 | Wt 280.0 lb

## 2023-03-28 DIAGNOSIS — R7303 Prediabetes: Secondary | ICD-10-CM | POA: Diagnosis not present

## 2023-03-28 DIAGNOSIS — I10 Essential (primary) hypertension: Secondary | ICD-10-CM

## 2023-03-28 DIAGNOSIS — Z6841 Body Mass Index (BMI) 40.0 and over, adult: Secondary | ICD-10-CM

## 2023-03-28 DIAGNOSIS — R7301 Impaired fasting glucose: Secondary | ICD-10-CM

## 2023-03-28 MED ORDER — WEGOVY 2.4 MG/0.75ML ~~LOC~~ SOAJ: 2.4000 mg | SUBCUTANEOUS | 1 refills | Status: DC

## 2023-03-28 MED ORDER — WEGOVY 1.7 MG/0.75ML ~~LOC~~ SOAJ: 1.7000 mg | SUBCUTANEOUS | 0 refills | Status: DC

## 2023-03-28 NOTE — Progress Notes (Signed)
 Subjective:  Joanne Johns is a 48 y.o. female who presents for Chief Complaint  Patient presents with   Medical Management of Chronic Issues    Follow-up on weight.      Patient Care Team: Joanne Johns, Joanne RAMAN, PA-C as PCP - General (Family Medicine) Joanne Candyce RAMAN, MD as PCP - Cardiology (Cardiology) Joanne Johns, Joanne Johns, gyn Joanne Johns, GI  Here for follow up on weight loss efforts  Current exercise: Not doing a lot of exercise  Dietary efforts: Cut back on sodas, french  Medication: Wegovy  1mg  weekly  Any side effects of medication: none  Hypertension-compliant with amlodipine  10 mg daily, valsartan  80 mg daily  Compliant with vitamin D  supplement   No other aggravating or relieving factors.    No other c/o.  Past Medical History:  Diagnosis Date   Abnormal Pap smear 2005   paps normal 2007, 2008, 2009, 2010, 2013, 2014   Arthritis    Back pain    Chlamydia 2016   Herpes gingivostomatitis    History of EKG 02/09/2009   normal   History of pregnancy    5 pregnancies, 2 live births, 3 TABs   HSV infection 2018   positive HSV I and II   Hypertension    Impaired fasting blood sugar 2015   Liver hemangioma 2008   per ultrasound and 02/2013 MRI abdomen   Obesity    Respiratory tract infection 02/07/2016    Current Outpatient Medications on File Prior to Visit  Medication Sig Dispense Refill   amLODipine  (NORVASC ) 10 MG tablet TAKE 1 TABLET(10 MG) BY MOUTH DAILY 90 tablet 3   levonorgestrel  (MIRENA ) 20 MCG/24HR IUD 1 each by Intrauterine route once.     Semaglutide -Weight Management (WEGOVY ) 1 MG/0.5ML SOAJ Inject 1 mg into the skin once a week. 2 mL 0   valsartan  (DIOVAN ) 80 MG tablet Take 1 tablet (80 mg total) by mouth daily. 90 tablet 1   Vitamin D , Ergocalciferol , (DRISDOL ) 1.25 MG (50000 UNIT) CAPS capsule Take 1 capsule (50,000 Units total) by mouth every 7 (seven) days. 12 capsule 3   No current  facility-administered medications on file prior to visit.    The following portions of the patient's history were reviewed and updated as appropriate: allergies, current medications, past family history, past medical history, past social history, past surgical history and problem list.  ROS Otherwise as in subjective above   Objective: BP 124/80   Pulse 72   Wt 280 lb (127 kg)   BMI 47.32 kg/m   Wt Readings from Last 3 Encounters:  03/28/23 280 lb (127 kg)  01/09/23 288 lb 3.2 oz (130.7 kg)  12/28/22 283 lb 9.6 oz (128.6 kg)   BP Readings from Last 3 Encounters:  03/28/23 124/80  01/09/23 118/74  12/28/22 110/68    General appearance: alert, no distress, well developed, well nourished Heart: RRR, normal S1, S2, no murmurs Lungs: CTA bilaterally, no wheezes, rhonchi, or rales Pulses: 2+ radial pulses, 2+ pedal pulses, normal cap refill Ext: no edema   Assessment: Encounter Diagnoses  Name Primary?   Essential hypertension Yes   BMI 45.0-49.9, adult (HCC)    Impaired fasting blood sugar    Prediabetes      Plan: We spent some time discussing exercise and various exercise strategies.  She needs to do better with exercise.  She is trying to eat healthy and has cut out water carb foods.  Tolerating medication just fine.  Increase Wegovy  1.7 mg weekly for the next month After that go up to the highest dose 2.4 mg weekly Get on some type of exercise program at least 4 to 5 days/week and incorporate your son of these exercises as well Examples include treadmill, elliptical, walking, hiking, running, swimming or other Continue efforts with your weight training Monitor your blood pressures a little closer over the next month or 2.  If you start seeing readings less than 115/65 then we would need to cut back on some blood pressure medicine For example if you lose 10+ pounds in the next 2 months and start seeing lower blood pressures we would need to possibly cut down both of  your blood pressure pill doses but you would need to contact me and let me know Plan to follow-up in 2 months  [name] was seen today for medical management of chronic issues.  Diagnoses and all orders for this visit:  Essential hypertension  BMI 45.0-49.9, adult (HCC)  Impaired fasting blood sugar  Prediabetes  Other orders -     Semaglutide -Weight Management (WEGOVY ) 1.7 MG/0.75ML SOAJ; Inject 1.7 mg into the skin once a week. -     Semaglutide -Weight Management (WEGOVY ) 2.4 MG/0.75ML SOAJ; Inject 2.4 mg into the skin once a week.    Follow up: 69mo

## 2023-03-28 NOTE — Patient Instructions (Addendum)
 Increase Wegovy  1.7 mg weekly for the next month After that go up to the highest dose 2.4 mg weekly Get on some type of exercise program at least 4 to 5 days/week and incorporate your son of these exercises as well Examples include treadmill, elliptical, walking, hiking, running, swimming or other Continue efforts with your weight training Monitor your blood pressures a little closer over the next month or 2.  If you start seeing readings less than 115/65 then we would need to cut back on some blood pressure medicine For example if you lose 10+ pounds in the next 2 months and start seeing lower blood pressures we would need to possibly cut down both of your blood pressure pill doses but you would need to contact me and let me know Plan to follow-up in 2 months

## 2023-04-16 ENCOUNTER — Ambulatory Visit (INDEPENDENT_AMBULATORY_CARE_PROVIDER_SITE_OTHER): Payer: Medicaid Other | Admitting: Medical

## 2023-04-16 VITALS — BP 122/70 | HR 74 | Wt 279.2 lb

## 2023-04-16 DIAGNOSIS — L989 Disorder of the skin and subcutaneous tissue, unspecified: Secondary | ICD-10-CM | POA: Diagnosis not present

## 2023-04-16 NOTE — Progress Notes (Signed)
Subjective:  Joanne Johns is a 48 y.o. female who presents for Chief Complaint  Patient presents with   skin tags    Skin tags on right and left side, left at cease of underarm     Here for skin lesions of concern.  She notes several skin lesions including 6 lesions on her left chest wall and underneath left breast, approximately 5 lesions on the right chest wall on the left breast, and 1 lesion on her left axilla, likely skin tags.  However 1 on each chest wall below breast gets irritated on the bra and has bled before multiple times.  One of the lesions actually halfway rubbed off.  She would like these lesions cut off.  No other aggravating or relieving factors.    No other c/o.  The following portions of the patient's history were reviewed and updated as appropriate: allergies, current medications, past family history, past medical history, past social history, past surgical history and problem list.  ROS Otherwise as in subjective above  Objective: BP 122/70   Pulse 74   Wt 279 lb 3.2 oz (126.6 kg)   BMI 47.18 kg/m   General appearance: alert, no distress, well developed, well nourished Lesion #1-left axilla anteriorly with a pedunculated fleshy skin tag approximately 3 mm x 4 mm Lesion #2-left chest wall inferior to breast that is round slightly raised flattopped and this is the lesion that she noted was  a skin tag that got rubbed off.  The base remains now Lesions 3 through 6 are all benign appearing fleshy pedunculated skin tags of the left lower chest wall anteriorly and laterally Lesions 7, 8, 9 are fleshy pedunculated skin tags of the right chest wall inferior to the breast Lesion 10 is a pedunculated benign-appearing fleshy skin tag on the right lateral inferior breast All lesions except #2 are less than 4 mm and are pedunculated fleshy suggestive of benign skin tags   Assessment: Encounter Diagnoses  Name Primary?   Skin abnormality Yes   Skin lesion       Plan: We discussed the skin findings.  Discussed different treatment options including cryotherapy, excision, other.  She would rather go ahead and do excision rather than referral to dermatology or other.  We discussed risk including infection, keloid, scarring or other.  To reduce the risk of keloid we decided to do 5 lesions today under the other ones on a different day.  We are going to monitor how these heal up over the next month before doing the other lesions  Cleaned and prepped skin lesions in sterile fashion.  Used ethyl acetate spray for local anesthesia and forceps and scissors to excise lesions #1 left axillary lesion, #10 on the right lateral breast, 1 pedunculated skin tag of the right lower chest wall, 1 fleshy pedunculated skin tag left lower chest wall.  Use 1% lidocaine with epi for local anesthesia for the lesion #2 of the left chest wall.  Excised with a razor with a shave excision.  Cleaned all lesions and cover with a layer of bacitracin ointment and Band-Aid.  Patient tolerated procedure well, estimated blood loss less than 2 cc  Discussed aftercare.  [name] was seen today for skin tags.  Diagnoses and all orders for this visit:  Skin abnormality  Skin lesion    Follow up: 59mo

## 2023-05-29 ENCOUNTER — Ambulatory Visit: Payer: Medicaid Other | Admitting: Medical

## 2023-05-29 VITALS — BP 120/80 | HR 84 | Wt 274.0 lb

## 2023-05-29 DIAGNOSIS — Z6841 Body Mass Index (BMI) 40.0 and over, adult: Secondary | ICD-10-CM

## 2023-05-29 DIAGNOSIS — L989 Disorder of the skin and subcutaneous tissue, unspecified: Secondary | ICD-10-CM

## 2023-05-29 DIAGNOSIS — R7301 Impaired fasting glucose: Secondary | ICD-10-CM

## 2023-05-29 DIAGNOSIS — I10 Essential (primary) hypertension: Secondary | ICD-10-CM | POA: Diagnosis not present

## 2023-05-29 MED ORDER — WEGOVY 2.4 MG/0.75ML ~~LOC~~ SOAJ: 2.4000 mg | SUBCUTANEOUS | 4 refills | Status: DC

## 2023-05-29 MED ORDER — VALSARTAN 80 MG PO TABS: 80.0000 mg | ORAL_TABLET | Freq: Every day | ORAL | 1 refills | Status: DC

## 2023-05-29 NOTE — Progress Notes (Signed)
 Subjective:  Joanne Johns is a 48 y.o. female who presents for Chief Complaint  Patient presents with   Follow-up    Follow-up on wegovy.      Here for follow up on weight loss efforts  Current exercise: Walking her neighborhood 2-3 days per week  Dietary efforts: Less calories overall, not eating as much as prior.  Eating 2 times daily.  Medication: YNWGNF 2.4mg  weekly dose  Any side effects of medication: none  Starting weight was 288 lb in October 2024.  She notes that the skin lesions that were cut off at her visit on April 16, 2023 are healing well and no sign of keloid.  She plans to come back in another time to have the other skin lesions removed.  No other aggravating or relieving factors.    No other c/o.  Past Medical History:  Diagnosis Date   Abnormal Pap smear 2005   paps normal 2007, 2008, 2009, 2010, 2013, 2014   Arthritis    Back pain    Chlamydia 2016   Herpes gingivostomatitis    History of EKG 02/09/2009   normal   History of pregnancy    5 pregnancies, 2 live births, 3 TABs   HSV infection 2018   positive HSV I and II   Hypertension    Impaired fasting blood sugar 2015   Liver hemangioma 2008   per ultrasound and 02/2013 MRI abdomen   Obesity    Respiratory tract infection 02/07/2016    Current Outpatient Medications on File Prior to Visit  Medication Sig Dispense Refill   amLODipine (NORVASC) 10 MG tablet TAKE 1 TABLET(10 MG) BY MOUTH DAILY 90 tablet 3   levonorgestrel (MIRENA) 20 MCG/24HR IUD 1 each by Intrauterine route once.     Vitamin D, Ergocalciferol, (DRISDOL) 1.25 MG (50000 UNIT) CAPS capsule Take 1 capsule (50,000 Units total) by mouth every 7 (seven) days. 12 capsule 3   No current facility-administered medications on file prior to visit.    The following portions of the patient's history were reviewed and updated as appropriate: allergies, current medications, past family history, past medical history, past social history,  past surgical history and problem list.  ROS Otherwise as in subjective above    Objective: BP 120/80   Pulse 84   Wt 274 lb (124.3 kg)   BMI 46.31 kg/m   Wt Readings from Last 3 Encounters:  05/29/23 274 lb (124.3 kg)  04/16/23 279 lb 3.2 oz (126.6 kg)  03/28/23 280 lb (127 kg)   General appearance: alert, no distress, well developed, well nourished Psych: pleasant, good eye contact, answers questions appropriately   Assessment: Encounter Diagnoses  Name Primary?   Impaired fasting blood sugar Yes   Essential hypertension    BMI 45.0-49.9, adult (HCC)    Skin lesion      Plan: Impaired fasting glucose - continue efforts with healthy lifestyle  HTN - continue current medication, amlodipine 10mg  daily and valsartan 80mg  daily  Weight loss efforts Continue AOZHYQ for weight loss and weight management and comorbitiides Starting weight 288lb in 12/2022, current weight 274lb Counseled on making additional changes to decreased calorie intake Increase exercise to 4+ days per week, and look at gym options with other   Skin lesion-thankfully no keloid issues from her recent skin lesion excision from her January visit here 2025.  She will return in the next 3 to 4 months to remove some of the other skin lesions.  Joanne Johns was seen today  for follow-up.  Diagnoses and all orders for this visit:  Impaired fasting blood sugar  Essential hypertension  BMI 45.0-49.9, adult (HCC)  Skin lesion  Other orders -     Semaglutide-Weight Management (WEGOVY) 2.4 MG/0.75ML SOAJ; Inject 2.4 mg into the skin once a week. -     valsartan (DIOVAN) 80 MG tablet; Take 1 tablet (80 mg total) by mouth daily.    Follow up: 3-41mo for skin lesion procedure and follow-up on Wegovy

## 2023-06-01 ENCOUNTER — Encounter: Payer: Self-pay | Admitting: Radiology

## 2023-06-01 ENCOUNTER — Ambulatory Visit (INDEPENDENT_AMBULATORY_CARE_PROVIDER_SITE_OTHER): Payer: Medicaid Other | Admitting: Radiology

## 2023-06-01 VITALS — BP 126/84 | Ht 64.5 in | Wt 272.4 lb

## 2023-06-01 DIAGNOSIS — Z1331 Encounter for screening for depression: Secondary | ICD-10-CM | POA: Diagnosis not present

## 2023-06-01 DIAGNOSIS — Z30433 Encounter for removal and reinsertion of intrauterine contraceptive device: Secondary | ICD-10-CM

## 2023-06-01 DIAGNOSIS — Z113 Encounter for screening for infections with a predominantly sexual mode of transmission: Secondary | ICD-10-CM

## 2023-06-01 DIAGNOSIS — Z01419 Encounter for gynecological examination (general) (routine) without abnormal findings: Secondary | ICD-10-CM

## 2023-06-01 NOTE — Progress Notes (Signed)
 Joanne Johns [birth year] [id number]   History:  48 y.o. G5P2 presents for annual exam. Mirena placed 7/17, due for exchange. C/o occasional vaginal odor. Would like full STI panel today. Last sexually active 10/24.  Gynecologic History No LMP recorded. (Menstrual status: IUD).   Contraception/Family planning: IUD Sexually active: yes Last Pap: 2024. Results were: normal Last mammogram: 11/24. Results were: normal Colonoscopy 2023 1 polyp, repeat 5 years  Obstetric History OB History  Gravida Para Term Preterm AB Living  5 2 2  0 3 2  SAB IAB Ectopic Multiple Live Births  0 3 0 0 2    # Outcome Date GA Lbr Len/2nd Weight Sex Type Anes PTL Lv  5 Term 05/03/11 [redacted]w[redacted]d 06:35 / 00:09 6 lb 12.5 oz (3.076 kg) M Vag-Vacuum EPI  LIV     Birth Comments: NONE  4 IAB 2012          3 IAB 2003          2 Term 1997 [redacted]w[redacted]d   F Vag-Spont   LIV  1 IAB 1996               06/01/2023    7:57 AM 01/09/2023    9:44 AM 12/05/2021    3:21 PM  Depression screen PHQ 2/9  Decreased Interest 0 0 0  Down, Depressed, Hopeless 0 0 0  PHQ - 2 Score 0 0 0     The following portions of the patient's history were reviewed and updated as appropriate: allergies, current medications, past family history, past medical history, past social history, past surgical history, and problem list.  Review of Systems  All other systems reviewed and are negative.   Past medical history, past surgical history, family history and social history were all reviewed and documented in the EPIC chart.  Exam:  Vitals:   06/01/23 0756  BP: 126/84  Weight: 272 lb 6.4 oz (123.6 kg)  Height: 5' 4.5" (1.638 m)   Body mass index is 46.04 kg/m.  Physical Exam Vitals and nursing note reviewed. Exam conducted with a chaperone present.  Constitutional:      Appearance: Normal appearance. She is obese.  HENT:     Head: Normocephalic and atraumatic.  Neck:     Thyroid: No thyroid mass, thyromegaly or thyroid tenderness.   Cardiovascular:     Rate and Rhythm: Regular rhythm.     Heart sounds: Normal heart sounds.  Pulmonary:     Effort: Pulmonary effort is normal.     Breath sounds: Normal breath sounds.  Chest:  Breasts:    Breasts are symmetrical.     Right: Normal. No inverted nipple, mass, nipple discharge, skin change or tenderness.     Left: Normal. No inverted nipple, mass, nipple discharge, skin change or tenderness.  Abdominal:     General: Abdomen is flat. Bowel sounds are normal.     Palpations: Abdomen is soft.  Genitourinary:    General: Normal vulva.     Vagina: Normal. No vaginal discharge, bleeding or lesions.     Cervix: Normal. No discharge or lesion.     Uterus: Normal. Not enlarged and not tender.      Adnexa: Right adnexa normal and left adnexa normal.       Right: No mass, tenderness or fullness.         Left: No mass, tenderness or fullness.       Comments: IUD string seen  Lymphadenopathy:     Upper Body:  Right upper body: No axillary adenopathy.     Left upper body: No axillary adenopathy.  Skin:    General: Skin is warm and dry.  Neurological:     Mental Status: She is alert and oriented to person, place, and time.  Psychiatric:        Mood and Affect: Mood normal.        Thought Content: Thought content normal.        Judgment: Judgment normal.      Raynelle Fanning, CMA present for exam  Assessment/Plan:   1. Well woman exam with routine gynecological exam (Primary) Pap 2027  2. Screening for STDs (sexually transmitted diseases) - HIV antibody (with reflex) - RPR - Hepatitis B Surface AntiGEN - Hepatitis C antibody - SureSwab Advanced Vaginitis Plus,TMA  3. Encounter for removal and reinsertion of intrauterine contraceptive device (IUD) - IUD Insertion; Future - IUD removal; Future     Jennye Runquist B WHNP-BC 8:13 AM 06/01/2023

## 2023-06-01 NOTE — Patient Instructions (Signed)
 Preventive Care 16-47 Years Old, Female  Preventive care refers to lifestyle choices and visits with your health care provider that can promote health and wellness. Preventive care visits are also called wellness exams.  What can I expect for my preventive care visit?  Counseling  Your health care provider may ask you questions about your:  Medical history, including:  Past medical problems.  Family medical history.  Pregnancy history.  Current health, including:  Menstrual cycle.  Method of birth control.  Emotional well-being.  Home life and relationship well-being.  Sexual activity and sexual health.  Lifestyle, including:  Alcohol, nicotine or tobacco, and drug use.  Access to firearms.  Diet, exercise, and sleep habits.  Work and work Astronomer.  Sunscreen use.  Safety issues such as seatbelt and bike helmet use.  Physical exam  Your health care provider will check your:  Height and weight. These may be used to calculate your BMI (body mass index). BMI is a measurement that tells if you are at a healthy weight.  Waist circumference. This measures the distance around your waistline. This measurement also tells if you are at a healthy weight and may help predict your risk of certain diseases, such as type 2 diabetes and high blood pressure.  Heart rate and blood pressure.  Body temperature.  Skin for abnormal spots.  What immunizations do I need?    Vaccines are usually given at various ages, according to a schedule. Your health care provider will recommend vaccines for you based on your age, medical history, and lifestyle or other factors, such as travel or where you work.  What tests do I need?  Screening  Your health care provider may recommend screening tests for certain conditions. This may include:  Lipid and cholesterol levels.  Diabetes screening. This is done by checking your blood sugar (glucose) after you have not eaten for a while (fasting).  Pelvic exam and Pap test.  Hepatitis B test.  Hepatitis C  test.  HIV (human immunodeficiency virus) test.  STI (sexually transmitted infection) testing, if you are at risk.  Lung cancer screening.  Colorectal cancer screening.  Mammogram. Talk with your health care provider about when you should start having regular mammograms. This may depend on whether you have a family history of breast cancer.  BRCA-related cancer screening. This may be done if you have a family history of breast, ovarian, tubal, or peritoneal cancers.  Bone density scan. This is done to screen for osteoporosis.  Talk with your health care provider about your test results, treatment options, and if necessary, the need for more tests.  Follow these instructions at home:  Eating and drinking    Eat a diet that includes fresh fruits and vegetables, whole grains, lean protein, and low-fat dairy products.  Take vitamin and mineral supplements as recommended by your health care provider.  Do not drink alcohol if:  Your health care provider tells you not to drink.  You are pregnant, may be pregnant, or are planning to become pregnant.  If you drink alcohol:  Limit how much you have to 0-1 drink a day.  Know how much alcohol is in your drink. In the [place] one drink equals one 12 oz bottle of beer (355 mL), one 5 oz glass of wine (148 mL), or one 1 oz glass of hard liquor (44 mL).  Lifestyle  Brush your teeth every morning and night with fluoride toothpaste. Floss one time each day.  Exercise for at least  30 minutes 5 or more days each week.  Do not use any products that contain nicotine or tobacco. These products include cigarettes, chewing tobacco, and vaping devices, such as e-cigarettes. If you need help quitting, ask your health care provider.  Do not use drugs.  If you are sexually active, practice safe sex. Use a condom or other form of protection to prevent STIs.  If you do not wish to become pregnant, use a form of birth control. If you plan to become pregnant, see your health care provider for a  prepregnancy visit.  Take aspirin only as told by your health care provider. Make sure that you understand how much to take and what form to take. Work with your health care provider to find out whether it is safe and beneficial for you to take aspirin daily.  Find healthy ways to manage stress, such as:  Meditation, yoga, or listening to music.  Journaling.  Talking to a trusted person.  Spending time with friends and family.  Minimize exposure to UV radiation to reduce your risk of skin cancer.  Safety  Always wear your seat belt while driving or riding in a vehicle.  Do not drive:  If you have been drinking alcohol. Do not ride with someone who has been drinking.  When you are tired or distracted.  While texting.  If you have been using any mind-altering substances or drugs.  Wear a helmet and other protective equipment during sports activities.  If you have firearms in your house, make sure you follow all gun safety procedures.  Seek help if you have been physically or sexually abused.  What's next?  Visit your health care provider once a year for an annual wellness visit.  Ask your health care provider how often you should have your eyes and teeth checked.  Stay up to date on all vaccines.  This information is not intended to replace advice given to you by your health care provider. Make sure you discuss any questions you have with your health care provider.  Document Revised: 09/01/2020 Document Reviewed: 09/01/2020  Elsevier Patient Education  2024 [employer].

## 2023-06-02 LAB — SURESWAB® ADVANCED VAGINITIS PLUS,TMA
C. trachomatis RNA, TMA: NOT DETECTED
CANDIDA SPECIES: NOT DETECTED
Candida glabrata: NOT DETECTED
N. gonorrhoeae RNA, TMA: NOT DETECTED
SURESWAB(R) ADV BACTERIAL VAGINOSIS(BV),TMA: NEGATIVE
TRICHOMONAS VAGINALIS (TV),TMA: NOT DETECTED

## 2023-06-02 LAB — HIV ANTIBODY (ROUTINE TESTING W REFLEX): HIV 1&2 Ab, 4th Generation: NONREACTIVE

## 2023-06-02 LAB — HEPATITIS B SURFACE ANTIGEN: Hepatitis B Surface Ag: NONREACTIVE

## 2023-06-02 LAB — RPR: RPR Ser Ql: NONREACTIVE

## 2023-06-02 LAB — HEPATITIS C ANTIBODY: Hepatitis C Ab: NONREACTIVE

## 2023-07-09 ENCOUNTER — Ambulatory Visit: Admitting: Family Medicine

## 2023-07-09 ENCOUNTER — Encounter: Payer: Self-pay | Admitting: Family Medicine

## 2023-07-09 VITALS — BP 160/100 | HR 76 | Temp 97.6°F | Ht 64.0 in | Wt 269.6 lb

## 2023-07-09 DIAGNOSIS — J069 Acute upper respiratory infection, unspecified: Secondary | ICD-10-CM | POA: Diagnosis not present

## 2023-07-09 DIAGNOSIS — I10 Essential (primary) hypertension: Secondary | ICD-10-CM

## 2023-07-09 DIAGNOSIS — R051 Acute cough: Secondary | ICD-10-CM

## 2023-07-09 DIAGNOSIS — J398 Other specified diseases of upper respiratory tract: Secondary | ICD-10-CM

## 2023-07-09 LAB — POC COVID19 BINAXNOW: SARS Coronavirus 2 Ag: NEGATIVE

## 2023-07-09 LAB — POCT INFLUENZA A/B
Influenza A, POC: NEGATIVE
Influenza B, POC: NEGATIVE

## 2023-07-09 NOTE — Patient Instructions (Signed)
 Drink plenty of water. Stop using Dayquil and Nyquil. These contain decongestants which can raise your blood pressure. Restart both of your home blood pressure medications--your blood pressure was very high today. Monitor your blood pressure and home to ensure that it comes back down (goal is <130/80).  Use Mucinex 12 hour twice daily.  Get the plain version (not the D or DM). If you are coughing a lot, you can get Delsym syrup, which is a 12 hour dextromethorphan, a cough suppressant. Consider doing sinus rinses (ie NeilMed sinus rinse kit, or Neti-pot, using boiled or distilled water, not tap water) once or twice daily, especially if you develop any sinus pressure.  You likely have a component of allergies as well, with the itchy and watery eyes. You previously were prescribed Flonase, This would help, in addition to continuing to take an antihistamine (such as claritin, xyzal, allegra, zyrtec). You can also use eye drops for allergies, if needed (Naphcon-A, Patanol, etc).  If you develop fever, persistently discolored mucus or phlegm, increasing sinus pain, this could indicate a bacterial infection and need antibiotics. If you have significant shortness of breath, high fever, or pain with breathing, please return for re-evaluation.

## 2023-07-09 NOTE — Progress Notes (Signed)
 Chief Complaint  Patient presents with   Cough    Started w/ST on 4/12 has progressed to congestion in her chest. Cough started 3 days ago. No covid tests have been done. Was in [facility] for [place] [facility].     She had a ST started on 4/12, and resolved after 2-3 days.  She had occasional sneeze. 3-4 days ago she started with nasal congestion, head congestion, chest congestion and cough. Sore throat recurred.  Nasal drainage has been clear.  Phlegm was yellow-brown this morning, which was the first time she has been able to get anything up.  She has some pressure behind both ears, at the back of the head.  She had some facial pain 1-2 days ago, improved today. Throat pain also resolved after 2-3 days of taking nyquil.  Chest hurts only when she coughs, not with exertion. She has had some shortness of breath with activity, which she associates more with head congestion.  She always has watery/itchy eyes. . Has taken some claritin.  Has been taking Dayquil and Nyquil, and also reports twice taking some sort of prescription decongestant (previously gotten from [facility] when she had COVID). Chart reviewed, visit was 07/2022, and was prescribed xyzal, flonase and tessalon perles at that time (no decongestant).  Hypertension: she admits she hasn't taken her BP meds x 3-4 days, due to feeling so poorly. She normally takes Diovan  and amlodipine .   PMH, PSH, SH reviewed HTN, IFG, obesity,  Outpatient Encounter Medications as of 07/09/2023  Medication Sig Note   levonorgestrel  (MIRENA ) 20 MCG/24HR IUD 1 each by Intrauterine route once. Inserted 09/2015    Pseudoeph-Doxylamine-DM-APAP (NYQUIL PO) Take 30 mLs by mouth at bedtime. 07/09/2023: Last night last dose   Semaglutide -Weight Management (WEGOVY ) 2.4 MG/0.75ML SOAJ Inject 2.4 mg into the skin once a week.    Vitamin D , Ergocalciferol , (DRISDOL ) 1.25 MG (50000 UNIT) CAPS capsule Take 1 capsule (50,000 Units total) by mouth every 7 (seven) days.    amLODipine   (NORVASC ) 10 MG tablet TAKE 1 TABLET(10 MG) BY MOUTH DAILY (Patient not taking: Reported on 07/09/2023) 07/09/2023: Has not taken in 4 days   valsartan  (DIOVAN ) 80 MG tablet Take 1 tablet (80 mg total) by mouth daily. (Patient not taking: Reported on 07/09/2023) 07/09/2023: Has not taken in 4 days   No facility-administered encounter medications on file as of 07/09/2023.   No Known Allergies  ROS: no fever, chills. URI symptoms per HPI. No headache, dizziness, ear pain. No n/v/d, urinary complaints. No bleeding, bruising, rash.     PHYSICAL EXAM:  BP (!) 160/100   Pulse 76   Temp 97.6 F (36.4 C) (Tympanic)   Ht 5\' 4"  (1.626 m)   Wt 269 lb 9.6 oz (122.3 kg)   BMI 46.28 kg/m   Wt Readings from Last 3 Encounters:  07/09/23 269 lb 9.6 oz (122.3 kg)  06/01/23 272 lb 6.4 oz (123.6 kg)  05/29/23 274 lb (124.3 kg)   Well-appearing female, in no distress. No sniffling or coughing during visit.  Rare cough. Appears comfortable. HEENT: conjunctiva and sclera are clear, EOMI. TM's and EAC normal. Nasal mucosa moderately edematous L>R, mild erythema, no purulence noted.  Sinuses are nontender. Nontender over mastoids (though that is where she reports pressure). Neck: No lymphadenopathy or mass Heart: regular rate and rhythm Lungs: clear bilaterally Psych: normal mood, affect, hygiene and grooming Neuro: alert and oriented, cranial nerves grossly intact, normal gait.  COVID and influenza A&B tests negative   ASSESSMENT/PLAN:  Viral upper respiratory illness - supportive measures reviewed in detail, and s/sx bacterial infection. f/u if sx persist/worsen  Essential hypertension - BP high--due to OTC decongestant use and not taking either BP med for a few days. To resume BP meds and avoid decongestants  Acute cough - Plan: POC COVID-19, Influenza A/B  Congestion of upper respiratory tract - Plan: POC COVID-19, Influenza A/B   I spent 34 minutes dedicated to the care of this patient,  including pre-visit review of records, face to face time, post-visit ordering of testing and documentation.   Drink plenty of water. Stop using Dayquil and Nyquil. These contain decongestants which can raise your blood pressure. Restart both of your home blood pressure medications--your blood pressure was very high today. Monitor your blood pressure and home to ensure that it comes back down (goal is <130/80).  Use Mucinex 12 hour twice daily.  Get the plain version (not the D or DM). If you are coughing a lot, you can get Delsym syrup, which is a 12 hour dextromethorphan, a cough suppressant. Consider doing sinus rinses (ie NeilMed sinus rinse kit, or Neti-pot, using boiled or distilled water, not tap water) once or twice daily, especially if you develop any sinus pressure.  You likely have a component of allergies as well, with the itchy and watery eyes. You previously were prescribed Flonase, This would help, in addition to continuing to take an antihistamine (such as claritin, xyzal, allegra, zyrtec). You can also use eye drops for allergies, if needed (Naphcon-A, Patanol, etc).  If you develop fever, persistently discolored mucus or phlegm, increasing sinus pain, this could indicate a bacterial infection and need antibiotics. If you have significant shortness of breath, high fever, or pain with breathing, please return for re-evaluation.

## 2023-07-31 ENCOUNTER — Telehealth: Payer: Self-pay

## 2023-07-31 ENCOUNTER — Other Ambulatory Visit (HOSPITAL_COMMUNITY): Payer: Self-pay

## 2023-07-31 NOTE — Telephone Encounter (Signed)
 Pharmacy Patient Advocate Encounter   Received notification from CoverMyMeds that prior authorization for Wegovy  2.4MG /0.75ML auto-injectorsis required/requested.   Insurance verification completed.   The patient is insured through [facility] .   Per test claim: PA required; PA submitted to above mentioned insurance via CoverMyMeds Key/confirmation #/EOC (Key: [id number])  Status is pending

## 2023-08-01 ENCOUNTER — Other Ambulatory Visit (HOSPITAL_COMMUNITY): Payer: Self-pay

## 2023-08-01 NOTE — Telephone Encounter (Signed)
 Pharmacy Patient Advocate Encounter  Received notification from [facility] that Prior Authorization for Wegovy  2.4MG /0.75ML auto-injectors has been APPROVED from 5.13.25 to 5.13.26. Ran test claim, Copay is $4.00. This test claim was processed through [facility]- copay amounts may vary at other pharmacies due to pharmacy/plan contracts, or as the patient moves through the different stages of their insurance plan.

## 2023-08-01 NOTE — Telephone Encounter (Signed)
 Approved.  Effective dates 5.13.25 to 5.13.26

## 2023-08-01 NOTE — Telephone Encounter (Signed)
 Copied from CRM [id number]. Topic: Referral - Prior Authorization Question >> Aug 01, 2023 10:45 AM Oddis Bench wrote: Reason for CRM: Patient is calling about pre auth for med Semaglutide -Weight Management (WEGOVY ) 2.4 MG/0.75ML SOAJ states that the insurance will not fill without the pre auth, she has had no problem until now. States it was sent on 05/12 and got rejected by insurance so needs to be resent by PCP.

## 2023-09-11 NOTE — Progress Notes (Deleted)
 GYNECOLOGY  VISIT   HPI: 48 y.o.   Single  African American female   [phone] with No LMP recorded. (Menstrual status: IUD).   here for: IUD removal and reinsertion  - Mirena      GYNECOLOGIC HISTORY: No LMP recorded. (Menstrual status: IUD). Contraception:  IUD  Menopausal hormone therapy:  n/a Last 2 paps:  03/28/22 neg, HR HPV neg, 02/26/19 neg HR HPV neg History of abnormal Pap or positive HPV:  no Mammogram:  02/01/23 Breast Density Cat B, BIRADS Cat 1 neg         OB History     Gravida  5   Para  2   Term  2   Preterm  0   AB  3   Living  2      SAB  0   IAB  3   Ectopic  0   Multiple  0   Live Births  2              Patient Active Problem List   Diagnosis Date Noted   Influenza 03/03/2022   Creatinine elevation 04/01/2021   Prediabetes 04/01/2021   Screening for heart disease 04/01/2021   BMI 45.0-49.9, adult (HCC) 04/01/2021   Routine general medical examination at a health care facility 02/07/2016   Essential hypertension 02/07/2016   IUD (intrauterine device) in place 02/07/2016   Obesity 12/25/2014   Screen for STD (sexually transmitted disease) 12/25/2014   Screening for cervical cancer 12/25/2014   Need for prophylactic vaccination and inoculation against influenza 12/25/2014   Encounter for health maintenance examination in adult 12/25/2014   Sleep disturbance 12/25/2014   Pelvic pain in female 12/25/2014   Encounter for surveillance of contraceptive pills 02/05/2014   Impaired fasting blood sugar 02/05/2014    Past Medical History:  Diagnosis Date   Abnormal Pap smear 2005   paps normal 2007, 2008, 2009, 2010, 2013, 2014   Arthritis    Back pain    Chlamydia 2016   Herpes gingivostomatitis    History of EKG 02/09/2009   normal   History of pregnancy    5 pregnancies, 2 live births, 3 TABs   HSV infection 2018   positive HSV I and II   Hypertension    Impaired fasting blood sugar 2015   Liver hemangioma 2008   per  ultrasound and 02/2013 MRI abdomen   Obesity    Respiratory tract infection 02/07/2016    Past Surgical History:  Procedure Laterality Date   COLPOSCOPY  05/2003   INTRAUTERINE DEVICE (IUD) INSERTION  2017   Mirena    KNEE SURGERY  2000   R knee, ACL, MCL meniscus    Current Outpatient Medications  Medication Sig Dispense Refill   amLODipine  (NORVASC ) 10 MG tablet TAKE 1 TABLET(10 MG) BY MOUTH DAILY (Patient not taking: Reported on 07/09/2023) 90 tablet 3   levonorgestrel  (MIRENA ) 20 MCG/24HR IUD 1 each by Intrauterine route once. Inserted 09/2015     Pseudoeph-Doxylamine-DM-APAP (NYQUIL PO) Take 30 mLs by mouth at bedtime.     Semaglutide -Weight Management (WEGOVY ) 2.4 MG/0.75ML SOAJ Inject 2.4 mg into the skin once a week. 3 mL 4   valsartan  (DIOVAN ) 80 MG tablet Take 1 tablet (80 mg total) by mouth daily. (Patient not taking: Reported on 07/09/2023) 90 tablet 1   Vitamin D , Ergocalciferol , (DRISDOL ) 1.25 MG (50000 UNIT) CAPS capsule Take 1 capsule (50,000 Units total) by mouth every 7 (seven) days. 12 capsule 3   No current  facility-administered medications for this visit.     ALLERGIES: Patient has no known allergies.  Family History  Problem Relation Age of Onset   Tuberculosis Father    Diabetes Father    Hypertension Father    Heart attack Father    Cancer Maternal Uncle        leukemia   Anesthesia problems Neg Hx    Heart disease Neg Hx    Hyperlipidemia Neg Hx    Stroke Neg Hx    Colon polyps Neg Hx    Colon cancer Neg Hx    Esophageal cancer Neg Hx    Rectal cancer Neg Hx    Stomach cancer Neg Hx     Social History   Socioeconomic History   Marital status: Single    Spouse name: Not on file   Number of children: Not on file   Years of education: Not on file   Highest education level: Not on file  Occupational History   Occupation: Advertising account executive    Comment: Orthoptist  Tobacco Use   Smoking status: Never    Passive exposure: Never    Smokeless tobacco: Never  Vaping Use   Vaping status: Never Used  Substance and Sexual Activity   Alcohol use: Yes    Comment: occ   Drug use: No   Sexual activity: Not Currently    Partners: Male    Birth control/protection: I.U.D.    Comment: menarche 48yo, sexual debut 48yo  Other Topics Concern   Not on file  Social History Narrative   Single, monogamous, 2 children, has sone and daughter, 1 granddaughter.  Nurse, adult firm.  Sexually active.  12/2022   Social Drivers of Health   Financial Resource Strain: Low Risk  (01/09/2023)   Overall Financial Resource Strain (CARDIA)    Difficulty of Paying Living Expenses: Not very hard  Food Insecurity: No Food Insecurity (01/09/2023)   Hunger Vital Sign    Worried About Running Out of Food in the Last Year: Never true    Ran Out of Food in the Last Year: Never true  Transportation Needs: No Transportation Needs (01/09/2023)   PRAPARE - Administrator, Civil Service (Medical): No    Lack of Transportation (Non-Medical): No  Physical Activity: Inactive (01/09/2023)   Exercise Vital Sign    Days of Exercise per Week: 0 days    Minutes of Exercise per Session: 0 min  Stress: Stress Concern Present (01/09/2023)   [employer] of Occupational Health - Occupational Stress Questionnaire    Feeling of Stress : To some extent  Social Connections: Moderately Integrated (01/09/2023)   Social Connection and Isolation Panel    Frequency of Communication with Friends and Family: More than three times a week    Frequency of Social Gatherings with Friends and Family: Once a week    Attends Religious Services: 1 to 4 times per year    Active Member of [employer] or Organizations: Yes    Attends Engineer, structural: More than 4 times per year    Marital Status: Never married  Intimate Partner Violence: Not At Risk (01/09/2023)   Humiliation, Afraid, Rape, and Kick questionnaire    Fear of  Current or Ex-Partner: No    Emotionally Abused: No    Physically Abused: No    Sexually Abused: No    Review of Systems  PHYSICAL EXAMINATION:   There were no vitals taken for this visit.  General appearance: alert, cooperative and appears stated age Head: Normocephalic, without obvious abnormality, atraumatic Neck: no adenopathy, supple, symmetrical, trachea midline and thyroid  normal to inspection and palpation Lungs: clear to auscultation bilaterally Breasts: normal appearance, no masses or tenderness, No nipple retraction or dimpling, No nipple discharge or bleeding, No axillary or supraclavicular adenopathy Heart: regular rate and rhythm Abdomen: soft, non-tender, no masses,  no organomegaly Extremities: extremities normal, atraumatic, no cyanosis or edema Skin: Skin color, texture, turgor normal. No rashes or lesions Lymph nodes: Cervical, supraclavicular, and axillary nodes normal. No abnormal inguinal nodes palpated Neurologic: Grossly normal  Pelvic: External genitalia:  no lesions              Urethra:  normal appearing urethra with no masses, tenderness or lesions              Bartholins and Skenes: normal                 Vagina: normal appearing vagina with normal color and discharge, no lesions              Cervix: no lesions                Bimanual Exam:  Uterus:  normal size, contour, position, consistency, mobility, non-tender              Adnexa: no mass, fullness, tenderness              Rectal exam: {yes no:314532}.  Confirms.              Anus:  normal sphincter tone, no lesions  Chaperone was present for exam:  {BSCHAPERONE:31226::Emily F, CMA}  ASSESSMENT:    PLAN:    {LABS (Optional):23779}  ***  total time was spent for this patient encounter, including preparation, face-to-face counseling with the patient, coordination of care, and documentation of the encounter.

## 2023-09-12 ENCOUNTER — Ambulatory Visit: Admitting: Obstetrics and Gynecology

## 2023-09-19 ENCOUNTER — Other Ambulatory Visit: Payer: Self-pay | Admitting: Medical Genetics

## 2023-09-26 ENCOUNTER — Ambulatory Visit: Admitting: Medical

## 2023-09-26 VITALS — BP 130/80 | HR 72 | Ht 64.0 in | Wt 268.0 lb

## 2023-09-26 DIAGNOSIS — R7301 Impaired fasting glucose: Secondary | ICD-10-CM

## 2023-09-26 DIAGNOSIS — E66813 Obesity, class 3: Secondary | ICD-10-CM | POA: Diagnosis not present

## 2023-09-26 DIAGNOSIS — I10 Essential (primary) hypertension: Secondary | ICD-10-CM | POA: Diagnosis not present

## 2023-09-26 DIAGNOSIS — E559 Vitamin D deficiency, unspecified: Secondary | ICD-10-CM

## 2023-09-26 MED ORDER — WEGOVY 2.4 MG/0.75ML ~~LOC~~ SOAJ: 2.4000 mg | SUBCUTANEOUS | 4 refills | Status: DC

## 2023-09-26 NOTE — Progress Notes (Signed)
 Subjective:  Joanne Johns is a 48 y.o. female who presents for Chief Complaint  Patient presents with   Follow-up    Follow-up weight      Here for follow up on weight loss efforts, medicaiton management.  HTN - compliant with medicaiton  Vit D deficiency - compliant with medication  Current exercise: Walking, but has knee issues . Walks in her neighborhood.  Saw ortho about knee.  Had gel injections.  Medication: Wegovy  2.4mg  weekly  Any side effects of medication: none currently. Was getting headaches some but that resolved  Dietary efforts: Sometimes gets sugar cravings, but doesn't always finish her meals, less appetite on Wegovy .  No other aggravating or relieving factors.    No other c/o.  Past Medical History:  Diagnosis Date   Abnormal Pap smear 2005   paps normal 2007, 2008, 2009, 2010, 2013, 2014   Arthritis    Back pain    Chlamydia 2016   Herpes gingivostomatitis    History of EKG 02/09/2009   normal   History of pregnancy    5 pregnancies, 2 live births, 3 TABs   HSV infection 2018   positive HSV I and II   Hypertension    Impaired fasting blood sugar 2015   Liver hemangioma 2008   per ultrasound and 02/2013 MRI abdomen   Obesity    Respiratory tract infection 02/07/2016    Current Outpatient Medications on File Prior to Visit  Medication Sig Dispense Refill   amLODipine  (NORVASC ) 10 MG tablet TAKE 1 TABLET(10 MG) BY MOUTH DAILY 90 tablet 3   levonorgestrel  (MIRENA ) 20 MCG/24HR IUD 1 each by Intrauterine route once. Inserted 09/2015     Pseudoeph-Doxylamine-DM-APAP (NYQUIL PO) Take 30 mLs by mouth at bedtime.     valsartan  (DIOVAN ) 80 MG tablet Take 1 tablet (80 mg total) by mouth daily. 90 tablet 1   Vitamin D , Ergocalciferol , (DRISDOL ) 1.25 MG (50000 UNIT) CAPS capsule Take 1 capsule (50,000 Units total) by mouth every 7 (seven) days. 12 capsule 3   No current facility-administered medications on file prior to visit.    The following  portions of the patient's history were reviewed and updated as appropriate: allergies, current medications, past family history, past medical history, past social history, past surgical history and problem list.  ROS Otherwise as in subjective above    Objective: BP 130/80   Pulse 72   Ht 5' 4 (1.626 m)   Wt 268 lb (121.6 kg)   SpO2 100%   BMI 46.00 kg/m   General appearance: alert, no distress, well developed, well nourished     Assessment: Encounter Diagnoses  Name Primary?   Impaired fasting blood sugar Yes   Essential hypertension    Morbid obesity (HCC)    Obesity, Class III, BMI 40-49.9 (morbid obesity)    Vitamin D  deficiency      Plan: Counseled on diet, exercise, strategies to find other exercise opportunities since she has knee issues.  Counseled on diet as well, being more strict on diet to facilitate more weight loss.  Continue Wegovy  highest dose 2.5 mg weekly  Continue current blood pressure medications amlodipine  10 mg daily and valsartan  80 mg daily  Continue vitamin D  supplement  Joanne Johns was seen today for follow-up.  Diagnoses and all orders for this visit:  Impaired fasting blood sugar  Essential hypertension  Morbid obesity (HCC)  Obesity, Class III, BMI 40-49.9 (morbid obesity)  Vitamin D  deficiency  Other orders -  Semaglutide -Weight Management (WEGOVY ) 2.4 MG/0.75ML SOAJ; Inject 2.4 mg into the skin once a week.    Follow up: later in fall for well visit

## 2023-09-26 NOTE — Patient Instructions (Signed)
 Recommendations: Consider using Glucosamine Chondroitin and/or Fish Oil supplements to help joint pain Consider follow up with yrou ortho doctor Try and get more weight bearing and cardio exercise reguarly.  Weight bearing exercise preferably 2 days per week, cardio preferably 4-5 days per week Be more strict on the diet.

## 2023-10-09 ENCOUNTER — Encounter: Payer: Self-pay | Admitting: Radiology

## 2023-10-09 ENCOUNTER — Ambulatory Visit (INDEPENDENT_AMBULATORY_CARE_PROVIDER_SITE_OTHER): Admitting: Radiology

## 2023-10-09 VITALS — BP 138/86 | HR 93 | Wt 263.8 lb

## 2023-10-09 DIAGNOSIS — Z30433 Encounter for removal and reinsertion of intrauterine contraceptive device: Secondary | ICD-10-CM

## 2023-10-09 DIAGNOSIS — Z01812 Encounter for preprocedural laboratory examination: Secondary | ICD-10-CM | POA: Diagnosis not present

## 2023-10-09 LAB — PREGNANCY, URINE: Preg Test, Ur: NEGATIVE

## 2023-10-09 MED ORDER — LEVONORGESTREL 20 MCG/DAY IU IUD
1.0000 | INTRAUTERINE_SYSTEM | Freq: Once | INTRAUTERINE | Status: AC
Start: 2023-10-09 — End: 2023-10-09
  Administered 2023-10-09: 1 via INTRAUTERINE

## 2023-10-09 NOTE — Progress Notes (Signed)
   Joanne Johns 1975-04-06 [id number]   History:  48 y.o. G5P2 presents for removal and reinsertion of Mirena  IUD.  Pt has been counseled about risks and benefits as well as complications.    Patient's last menstrual period was 10/08/2023 (approximate). GC/CT/Trich testing: obtained   Past medical history, past surgical history, family history and social history were all reviewed and documented in the EPIC chart.  ROS:  A ROS was performed and pertinent positives and negatives are included.  Time out performed and informed consent received.  Exam: Vitals:   10/09/23 0832  BP: 138/86  Pulse: 93  SpO2: 99%  Weight: 263 lb 12.8 oz (119.7 kg)   Body mass index is 45.28 kg/m.  Pelvic exam: Vulva:  normal female genitalia Vagina:  normal vagina, no discharge, exudate, lesion, or erythema Cervix:  Non-tender, Negative CMT, no lesions or redness. Uterus:  normal shape, position and consistency    Procedure:  Speculum inserted.   Cervix visualized and cleansed with Betadine x 3.  Tenaculum placed on anterior cervix. IUD removed easily. Then uterus sounded to 8 cm. IUD inserted easily. Strings trimmed to 3 cm.  Minimal bleeding noted.  Pt tolerated the procedure well.  Chaperone present: Darice Hoit, CMA   Assessment/Plan: 1. Encounter for removal and reinsertion of intrauterine contraceptive device (IUD) - IUD Insertion - IUD removal - levonorgestrel  (MIRENA ) 20 MCG/DAY IUD 1 each  2. Encounter for preprocedural laboratory examination (Primary) - Pregnancy, urine; negative      Return for recheck 4-6 weeks Pt aware to call for any concerns Pt aware removal due no later than 10/08/28, IUD card given to pt.   Genavive Kubicki B WHNP-BC, 8:45 AM 10/09/2023

## 2023-10-10 LAB — SURESWAB CT/NG/T. VAGINALIS
C. trachomatis RNA, TMA: NOT DETECTED
N. gonorrhoeae RNA, TMA: NOT DETECTED

## 2023-10-25 ENCOUNTER — Other Ambulatory Visit: Payer: Self-pay | Admitting: Medical

## 2023-11-21 ENCOUNTER — Encounter: Payer: Self-pay | Admitting: Radiology

## 2023-11-21 ENCOUNTER — Ambulatory Visit: Admitting: Radiology

## 2023-11-21 VITALS — BP 138/86 | Wt 264.0 lb

## 2023-11-21 DIAGNOSIS — Z30431 Encounter for routine checking of intrauterine contraceptive device: Secondary | ICD-10-CM

## 2023-11-21 NOTE — Progress Notes (Signed)
     History:  48 y.o. [id number] here today for today for IUD string check; Mirena  IUD was placed  10/09/23. No complaints about the IUD, no concerning side effects.  The following portions of the patient's history were reviewed and updated as appropriate: allergies, current medications, past family history, past medical history, past social history, past surgical history and problem list.  Review of Systems:  Pertinent items are noted in HPI.   Objective:  Physical Exam Blood pressure 138/86, weight 264 lb (119.7 kg). Gen: NAD Abd: Soft, nontender and nondistended Pelvic: Normal appearing external genitalia; normal appearing vaginal mucosa and cervix.  IUD strings visualized, about 3 cm in length outside cervix.   Darice Hoit, CMA present for exam  Assessment & Plan:  Normal IUD check. Patient may keep IUD in place for up to 8 years. May remover sooner  if she desires pregnancy, or has side effects within that time.   Orvie Caradine, WHNP

## 2024-01-09 ENCOUNTER — Ambulatory Visit: Admitting: Medical

## 2024-01-09 VITALS — BP 124/80 | HR 89 | Ht 64.5 in | Wt 260.2 lb

## 2024-01-09 DIAGNOSIS — R7989 Other specified abnormal findings of blood chemistry: Secondary | ICD-10-CM

## 2024-01-09 DIAGNOSIS — E559 Vitamin D deficiency, unspecified: Secondary | ICD-10-CM

## 2024-01-09 DIAGNOSIS — G479 Sleep disorder, unspecified: Secondary | ICD-10-CM | POA: Diagnosis not present

## 2024-01-09 DIAGNOSIS — L989 Disorder of the skin and subcutaneous tissue, unspecified: Secondary | ICD-10-CM

## 2024-01-09 DIAGNOSIS — Z0000 Encounter for general adult medical examination without abnormal findings: Secondary | ICD-10-CM

## 2024-01-09 DIAGNOSIS — Z975 Presence of (intrauterine) contraceptive device: Secondary | ICD-10-CM | POA: Diagnosis not present

## 2024-01-09 DIAGNOSIS — Z13220 Encounter for screening for lipoid disorders: Secondary | ICD-10-CM

## 2024-01-09 DIAGNOSIS — Z23 Encounter for immunization: Secondary | ICD-10-CM | POA: Diagnosis not present

## 2024-01-09 DIAGNOSIS — I10 Essential (primary) hypertension: Secondary | ICD-10-CM | POA: Diagnosis not present

## 2024-01-09 DIAGNOSIS — R7301 Impaired fasting glucose: Secondary | ICD-10-CM | POA: Diagnosis not present

## 2024-01-09 NOTE — Patient Instructions (Signed)
 Follow-up for a nurse visit in a couple weeks for TDAP

## 2024-01-09 NOTE — Progress Notes (Signed)
 Subjective:   HPI  Joanne Johns is a 48 y.o. female who presents for Chief Complaint  Patient presents with   Annual Exam    Fasting cpe, skin tag removal at bra line.  flu and covid shot today. Wegovy  is no longer covered by insurance and only has 2 more doses left. Wants to know what is next    Patient Care Team: Jyllian Haynie, Alm RAMAN, PA-C as PCP - General (Family Medicine) Dann Candyce RAMAN, MD as PCP - Cardiology (Cardiology) [facility], [facility] NP, gyn Dr. Luke Brass, GI   Concerns: Joanne Johns is a 48 year old female with hypertension who presents well visit, medication management and follow-up on weight management.  She has been using Wegovy  for weight management, with her last dose taken on Saturday. Her insurance will no longer cover this medication, and she has two doses remaining. Initially weighing 291 pounds, she has reduced her weight to 260 pounds, maintaining this weight since July. She walks two to three times a week, but knee pain has limited her activity.  She has a history of knee issues, having received a gel shot previously which provided relief. However, the knee on which she had surgery is starting to give her trouble again. She attended a concert over the weekend and stood for two to three hours, which exacerbated her knee pain the following day.  Her current medications include amlodipine  10 mg and valsartan  80 mg for hypertension. She occasionally misses doses of her blood pressure medication, sometimes skipping one to three days.  She is not allergic to any medications and is currently using a Mirena  IUD. She is involved in a genealogy research study.    Reviewed their medical, surgical, family, social, medication, and allergy history and updated chart as appropriate.  No Known Allergies  Past Medical History:  Diagnosis Date   Abnormal Pap smear 2005   paps normal 2007, 2008, 2009, 2010, 2013, 2014   Arthritis     Back pain    Chlamydia 2016   Herpes gingivostomatitis    History of EKG 02/09/2009   normal   History of pregnancy    5 pregnancies, 2 live births, 3 TABs   HSV infection 2018   positive HSV I and II   Hypertension    Impaired fasting blood sugar 2015   Liver hemangioma 2008   per ultrasound and 02/2013 MRI abdomen   Obesity    Respiratory tract infection 02/07/2016    Current Outpatient Medications on File Prior to Visit  Medication Sig Dispense Refill   amLODipine  (NORVASC ) 10 MG tablet TAKE 1 TABLET(10 MG) BY MOUTH DAILY 90 tablet 3   levonorgestrel  (MIRENA ) 20 MCG/DAY IUD 1 each by Intrauterine route once.     valsartan  (DIOVAN ) 80 MG tablet TAKE 1 TABLET(80 MG) BY MOUTH DAILY 90 tablet 1   WEGOVY  2.4 MG/0.75ML SOAJ SQ injection ADMINISTER 2.4 MG UNDER THE SKIN 1 TIME A WEEK 3 mL 4   No current facility-administered medications on file prior to visit.      Current Outpatient Medications:    amLODipine  (NORVASC ) 10 MG tablet, TAKE 1 TABLET(10 MG) BY MOUTH DAILY, Disp: 90 tablet, Rfl: 3   levonorgestrel  (MIRENA ) 20 MCG/DAY IUD, 1 each by Intrauterine route once., Disp: , Rfl:    valsartan  (DIOVAN ) 80 MG tablet, TAKE 1 TABLET(80 MG) BY MOUTH DAILY, Disp: 90 tablet, Rfl: 1   WEGOVY  2.4 MG/0.75ML SOAJ SQ injection, ADMINISTER 2.4 MG  UNDER THE SKIN 1 TIME A WEEK, Disp: 3 mL, Rfl: 4  Family History  Problem Relation Age of Onset   Tuberculosis Father    Diabetes Father    Hypertension Father    Heart attack Father    Cancer Maternal Uncle        leukemia   Anesthesia problems Neg Hx    Heart disease Neg Hx    Hyperlipidemia Neg Hx    Stroke Neg Hx    Colon polyps Neg Hx    Colon cancer Neg Hx    Esophageal cancer Neg Hx    Rectal cancer Neg Hx    Stomach cancer Neg Hx     Past Surgical History:  Procedure Laterality Date   COLPOSCOPY  05/2003   INTRAUTERINE DEVICE (IUD) INSERTION  2017   Mirena    KNEE SURGERY  2000   R knee, ACL, MCL meniscus   Review of  Systems  Constitutional:  Negative for chills, fever, malaise/fatigue and weight loss.  HENT:  Negative for congestion, ear pain, hearing loss, sore throat and tinnitus.   Eyes:  Negative for blurred vision, pain and redness.  Respiratory:  Negative for cough, hemoptysis and shortness of breath.   Cardiovascular:  Negative for chest pain, palpitations, orthopnea, claudication and leg swelling.  Gastrointestinal:  Negative for abdominal pain, blood in stool, constipation, diarrhea, nausea and vomiting.  Genitourinary:  Negative for dysuria, flank pain, frequency, hematuria and urgency.  Musculoskeletal:  Positive for joint pain. Negative for falls and myalgias.  Skin:  Negative for itching and rash.  Neurological:  Negative for dizziness, tingling, speech change, weakness and headaches.  Endo/Heme/Allergies:  Negative for polydipsia. Does not bruise/bleed easily.  Psychiatric/Behavioral:  Negative for depression and memory loss. The patient has insomnia. The patient is not nervous/anxious.        Trouble staying asleep, tried melatonin, but had hangover feeling afterwards next day        Objective:  BP 124/80   Pulse 89   Ht 5' 4.5 (1.638 m)   Wt 260 lb 3.2 oz (118 kg)   SpO2 98%   BMI 43.97 kg/m   General appearance: alert, no distress, WD/WN, African American female Skin: 3 pedunculated skin tags on left torso/chest and abdominal wall, and 3 similar on right torso chest and abdominal wall, tattoo of clover on right dorsal hand , otherwise skin unremarkable HEENT: normocephalic, conjunctiva/corneas normal, sclerae anicteric, PERRLA, EOMi, nares patent, no discharge or erythema, pharynx normal Oral cavity: MMM, tongue normal, teeth normal Neck: supple, no lymphadenopathy, no thyromegaly, no masses, normal ROM, no bruits Chest: non tender, normal shape and expansion Heart: RRR, normal S1, S2, no murmurs Lungs: CTA bilaterally, no wheezes, rhonchi, or rales Abdomen: +bs, soft, non  tender, non distended, no masses, no hepatomegaly, no splenomegaly, no bruits Back: non tender, normal ROM, no scoliosis Musculoskeletal: upper extremities non tender, no obvious deformity, normal ROM throughout, lower extremities non tender, no obvious deformity, normal ROM throughout Extremities: no edema, no cyanosis, no clubbing Pulses: 2+ symmetric, upper and lower extremities, normal cap refill Neurological: alert, oriented x 3, CN2-12 intact, strength normal upper extremities and lower extremities, sensation normal throughout, DTRs 2+ throughout, no cerebellar signs, gait normal Psychiatric: normal affect, behavior normal, pleasant  Breast/gyn/rectal - deferred to gynecology     Assessment and Plan :   Encounter Diagnoses  Name Primary?   Encounter for health maintenance examination in adult Yes   Needs flu shot    COVID-19  vaccine administered    Morbid obesity (HCC)    Impaired fasting blood sugar    Essential hypertension    IUD (intrauterine device) in place    Sleep disturbance    Screening for lipid disorders    Vitamin D  deficiency    Elevated serum creatinine    Skin lesion      This visit was a preventative care visit, also known as wellness visit or routine physical.   Topics typically include healthy lifestyle, diet, exercise, preventative care, vaccinations, sick and well care, proper use of emergency dept and after hours care, as well as other concerns.    Separate significant issues discussed: Hypertension with abnormal kidney function Hypertension managed with Amlodipine  10mg  and Valsartan  80mg  daily. Abnormal creatinine levels indicate kidney function concern. Risk factors include hypertension and potential dehydration. - Encourage adherence to blood pressure medications. - Order blood tests to monitor kidney function, including creatinine, GFR, and microalbumin. - Advise on adequate hydration.  Obesity Obesity with weight reduction from 291 lbs to 260  lbs. Wegovy  used for weight management, but insurance no longer covers it. Two doses remain, advised to use every two weeks. Emphasized maintaining a healthy diet and monitoring caloric intake. - Administer remaining Wegovy  doses every two weeks. - Encourage dietary modifications to reduce caloric intake. - Advise her to contact insurance for coverage options for weight management medications.  Bilateral knee pain Bilateral knee pain with history of surgery and recent exacerbation. Previous gel injections provided relief.  Prediabetes Borderline A1c levels indicating prediabetes. Monitoring and lifestyle modifications necessary to prevent progression. - Order A1c test to monitor blood glucose levels. - Encourage lifestyle modifications including diet and exercise.  Vitamin D  deficiency Low vitamin D  levels. Not taking prescribed vitamin D  supplement. - Order vitamin D  level test. - Reinforce importance of taking prescribed vitamin D  supplements.  Separate procedure: Skin lesions -we discussed the skin lesions of her left and right torso early chest and abdominal wall.  She request these to be removed.  We have removed prior similar few months ago and she did fine with that procedure.  She consents to excision with sterile scissors.  The lesions are benign skin tags.  Skin tag - Discussed risks/benefits of procedure, and patient gave consent for excision of skin tag.  Prepped skin in usual sterile fashion.  Used ethyl acetate spray for local anesthesia.  Left lower chest wall anterior laterally and mid abdomen with 4 specific pedunculated small less than 3 mm base skin tags noninflamed,  with sterile scissors.  3 similar small pedunculated flesh-colored skin tags benign-appearing less than 3 mm diameter base on the right lower anterolateral chest wall and mid abdomen.  Covered wound with sterile bandage and dressing.  EBL <1cc.  Patient tolerated procedure well.  Discussed wound care, and  advised f/u prn   General Recommendations: Continue to return yearly for your annual wellness and preventative care visits.  This gives us  a chance to discuss healthy lifestyle, exercise, vaccinations, review your chart record, and perform screenings where appropriate.  I recommend you see your eye doctor yearly for routine vision care.  I recommend you see your dentist yearly for routine dental care including hygiene visits twice yearly.   Vaccination recommendations were reviewed Immunization History  Administered Date(s) Administered   DTaP 12/14/1976, 01/13/1977, 02/01/1978, 12/21/1981   HPV 9-valent 03/03/2020   IPV 12/14/1976, 01/13/1977, 02/01/1978, 12/21/1981   Influenza, Seasonal, Injecte, Preservative Fre 12/28/2022   Influenza,inj,Quad PF,6+ Mos 02/04/2013, 02/05/2014, 12/25/2014  MMR 02/01/1978, 10/28/1997   Meningococcal polysaccharide vaccine (MPSV4) 04/01/2008   PFIZER(Purple Top)SARS-COV-2 Vaccination 06/16/2019, 07/07/2019   PPD Test 04/01/2008   Pfizer(Comirnaty)Fall Seasonal Vaccine 12 years and older 12/28/2022   Td 08/24/2005   Tdap 05/04/2011    Vaccine recommendations: Tdap, flu, covid  Vaccines administered today: Influenza and covid vaccine   Screening for cancer: Colon cancer screening: Prior or last colon cancer screen: 2023 reviewed, repeat 10 years   Breast cancer screening: You should perform a self breast exam monthly.   We reviewed recommendations for regular mammograms and breast cancer screening. Last mammogram: 01/2023 normal   Cervical cancer screening: We reviewed recommendations for pap smear screening. Last pap reviewed from 03/2022   Skin cancer screening: Check your skin regularly for new changes, growing lesions, or other lesions of concern Come in for evaluation if you have skin lesions of concern.  Lung cancer screening: If you have a greater than 20 pack year history of tobacco use, then you may qualify for lung  cancer screening with a chest CT scan.   Please call your insurance company to inquire about coverage for this test.  Pancreatic cancer: no current screening test is available routinely recommended.  (Risk factors: Smoking, overweight or obese, diabetes, chronic pancreatitis, work Nurse, mental health, Solicitor, 6 year old or greater, female greater than female, African-American, family history of pancreatic cancer, hereditary breast, ovarian, melanoma, Lynch, Peutz-jeghers).  We currently don't have screenings for other cancers besides breast, cervical, colon, and lung cancers.  If you have a strong family history of cancer or have other cancer screening concerns, please let me know.    Bone health: Get at least 150 minutes of aerobic exercise weekly Get weight bearing exercise at least once weekly Bone density test:  A bone density test is an imaging test that uses a type of X-ray to measure the amount of calcium and other minerals in your bones. The test may be used to diagnose or screen you for a condition that causes weak or thin bones (osteoporosis), predict your risk for a broken bone (fracture), or determine how well your osteoporosis treatment is working. The bone density test is recommended for females 65 and older, or females or males <65 if certain risk factors such as thyroid  disease, long term use of steroids such as for asthma or rheumatological issues, vitamin D  deficiency, estrogen deficiency, family history of osteoporosis, self or family history of fragility fracture in first degree relative.    Heart health: Get at least 150 minutes of aerobic exercise weekly Limit alcohol It is important to maintain a healthy blood pressure and healthy cholesterol numbers  Heart disease screening: Screening for heart disease includes screening for blood pressure, fasting lipids, glucose/diabetes screening, BMI height to weight ratio, reviewed of smoking status, physical activity, and  diet.    Goals include blood pressure 120/80 or less, maintaining a healthy lipid/cholesterol profile, preventing diabetes or keeping diabetes numbers under good control, not smoking or using tobacco products, exercising most days per week or at least 150 minutes per week of exercise, and eating healthy variety of fruits and vegetables, healthy oils, and avoiding unhealthy food choices like fried food, fast food, high sugar and high cholesterol foods.    Other tests may possibly include EKG test, CT coronary calcium score, echocardiogram, exercise treadmill stress test.   Of note, CT chest 9/23 showed no atherosclerosis.   Vascular disease screening: For high risk individuals including smokers, diabetes, patients with known heart disease or high blood pressure,  kidney disease, and others, screening for vascular disease or atherosclerosis of the arteries is available.  Examples may include carotid ultrasound, abdominal aortic ultrasound, ABI blood flow screening in the legs, thoracic aorta screening.   Medical care options: I recommend you continue to seek care here first for routine care.  We try really hard to have available appointments Monday through Friday daytime hours for sick visits, acute visits, and physicals.  Urgent care should be used for after hours and weekends for significant issues that cannot wait till the next day.  The emergency department should be used for significant potentially life-threatening emergencies.  The emergency department is expensive, can often have long wait times for less significant concerns, so try to utilize primary care, urgent care, or telemedicine when possible to avoid unnecessary trips to the emergency department.  Virtual visits and telemedicine have been introduced since the pandemic started in 2020, and can be convenient ways to receive medical care.  We offer virtual appointments as well to assist you in a variety of options to seek medical  care.    Joanne Johns was seen today for annual exam.  Diagnoses and all orders for this visit:  Encounter for health maintenance examination in adult -     CBC -     Comprehensive metabolic panel with GFR -     Lipid panel -     TSH -     Hemoglobin A1c -     VITAMIN D  25 Hydroxy (Vit-D Deficiency, Fractures) -     Microalbumin/Creatinine Ratio, Urine -     Urinalysis, Routine w reflex microscopic  Needs flu shot -     Flu vaccine trivalent PF, 6mos and older(Flulaval,Afluria,Fluarix,Fluzone)  COVID-19 vaccine administered Best boy Vaccine 106yrs & older  Morbid obesity (HCC)  Impaired fasting blood sugar -     Hemoglobin A1c  Essential hypertension -     Microalbumin/Creatinine Ratio, Urine -     Urinalysis, Routine w reflex microscopic  IUD (intrauterine device) in place  Sleep disturbance  Screening for lipid disorders -     Lipid panel  Vitamin D  deficiency -     VITAMIN D  25 Hydroxy (Vit-D Deficiency, Fractures)  Elevated serum creatinine -     Microalbumin/Creatinine Ratio, Urine -     Urinalysis, Routine w reflex microscopic  Skin lesion    Follow-up pending labs, yearly for physical

## 2024-01-10 ENCOUNTER — Other Ambulatory Visit: Payer: Self-pay | Admitting: Medical Genetics

## 2024-01-10 DIAGNOSIS — Z006 Encounter for examination for normal comparison and control in clinical research program: Secondary | ICD-10-CM

## 2024-01-11 ENCOUNTER — Ambulatory Visit: Payer: Self-pay | Admitting: Medical

## 2024-01-11 ENCOUNTER — Other Ambulatory Visit: Payer: Self-pay | Admitting: Medical

## 2024-01-11 LAB — COMPREHENSIVE METABOLIC PANEL WITH GFR
ALT: 10 IU/L (ref 0–32)
AST: 11 IU/L (ref 0–40)
Albumin: 4.3 g/dL (ref 3.9–4.9)
Alkaline Phosphatase: 84 IU/L (ref 41–116)
BUN/Creatinine Ratio: 11 (ref 9–23)
BUN: 13 mg/dL (ref 6–24)
Bilirubin Total: 0.3 mg/dL (ref 0.0–1.2)
CO2: 22 mmol/L (ref 20–29)
Calcium: 9.9 mg/dL (ref 8.7–10.2)
Chloride: 105 mmol/L (ref 96–106)
Creatinine, Ser: 1.15 mg/dL — ABNORMAL HIGH (ref 0.57–1.00)
Globulin, Total: 2.4 g/dL (ref 1.5–4.5)
Glucose: 81 mg/dL (ref 70–99)
Potassium: 4.3 mmol/L (ref 3.5–5.2)
Sodium: 141 mmol/L (ref 134–144)
Total Protein: 6.7 g/dL (ref 6.0–8.5)
eGFR: 59 mL/min/1.73 — ABNORMAL LOW (ref >59)

## 2024-01-11 LAB — CBC
Hematocrit: 44.6 % (ref 34.0–46.6)
Hemoglobin: 14.2 g/dL (ref 11.1–15.9)
MCH: 30.1 pg (ref 26.6–33.0)
MCHC: 31.8 g/dL (ref 31.5–35.7)
MCV: 95 fL (ref 79–97)
Platelets: 290 x10E3/uL (ref 150–450)
RBC: 4.71 x10E6/uL (ref 3.77–5.28)
RDW: 12.7 % (ref 11.7–15.4)
WBC: 5.7 x10E3/uL (ref 3.4–10.8)

## 2024-01-11 LAB — TSH: TSH: 1.79 u[IU]/mL (ref 0.450–4.500)

## 2024-01-11 LAB — LIPID PANEL
Chol/HDL Ratio: 2.3 ratio (ref 0.0–4.4)
Cholesterol, Total: 217 mg/dL — ABNORMAL HIGH (ref 100–199)
HDL: 96 mg/dL (ref >39)
LDL Chol Calc (NIH): 110 mg/dL — ABNORMAL HIGH (ref 0–99)
Triglycerides: 65 mg/dL (ref 0–149)
VLDL Cholesterol Cal: 11 mg/dL (ref 5–40)

## 2024-01-11 LAB — HEMOGLOBIN A1C
Est. average glucose Bld gHb Est-mCnc: 111 mg/dL
Hgb A1c MFr Bld: 5.5 % (ref 4.8–5.6)

## 2024-01-11 LAB — URINALYSIS, ROUTINE W REFLEX MICROSCOPIC

## 2024-01-11 LAB — MICROALBUMIN / CREATININE URINE RATIO

## 2024-01-11 LAB — VITAMIN D 25 HYDROXY (VIT D DEFICIENCY, FRACTURES): Vit D, 25-Hydroxy: 23.8 ng/mL — AB (ref 30.0–100.0)

## 2024-01-11 MED ORDER — VALSARTAN 80 MG PO TABS: 80.0000 mg | ORAL_TABLET | Freq: Every day | ORAL | 3 refills | Status: AC

## 2024-01-11 MED ORDER — VITAMIN D (ERGOCALCIFEROL) 1.25 MG (50000 UNIT) PO CAPS: 50000.0000 [IU] | ORAL_CAPSULE | ORAL | 3 refills | Status: AC

## 2024-01-11 MED ORDER — AMLODIPINE BESYLATE 10 MG PO TABS: ORAL_TABLET | ORAL | 3 refills | Status: AC

## 2024-01-11 NOTE — Progress Notes (Signed)
 What happened to the urine.  I was supposed to get a urinalysis and microalbumin?

## 2024-01-29 ENCOUNTER — Other Ambulatory Visit (HOSPITAL_COMMUNITY): Payer: Self-pay

## 2024-02-05 LAB — GENECONNECT MOLECULAR SCREEN: Genetic Analysis Overall Interpretation: NEGATIVE

## 2024-04-08 DIAGNOSIS — L03011 Cellulitis of right finger: Secondary | ICD-10-CM

## 2024-04-09 ENCOUNTER — Other Ambulatory Visit: Payer: Self-pay | Admitting: Medical

## 2024-04-09 MED ORDER — ZEPBOUND 2.5 MG/0.5ML ~~LOC~~ SOAJ: 2.5000 mg | SUBCUTANEOUS | 0 refills | Status: AC

## 2024-04-10 ENCOUNTER — Other Ambulatory Visit (HOSPITAL_COMMUNITY): Payer: Self-pay

## 2024-04-14 ENCOUNTER — Other Ambulatory Visit (HOSPITAL_COMMUNITY): Payer: Self-pay

## 2024-04-14 NOTE — Telephone Encounter (Signed)
 PA needed

## 2024-04-15 ENCOUNTER — Telehealth: Payer: Self-pay | Admitting: Medical

## 2024-04-15 NOTE — Telephone Encounter (Unsigned)
 Copied from CRM #[id number]. Topic: General - Other >> Apr 15, 2024  3:51 PM Joesph B wrote: Reason for CRM: patient is calling to speak to Sabrina about a PA for zepbound . Please call back

## 2024-04-15 NOTE — Telephone Encounter (Signed)
 Please give patient an update on PA for zepbound . Pt has not heard anything and her pharmacy is still waiting on PA to be done

## 2024-04-15 NOTE — Telephone Encounter (Signed)
 Copied from CRM #[id number]. Topic: General - Other >> Apr 15, 2024  3:51 PM Joanne Johns wrote: Reason for CRM: patient is calling to speak to Sabrina about a PA for zepbound . Please call back

## 2024-04-18 ENCOUNTER — Encounter: Payer: Self-pay | Admitting: Internal Medicine

## 2024-04-18 ENCOUNTER — Ambulatory Visit: Payer: Self-pay

## 2024-04-18 ENCOUNTER — Ambulatory Visit: Admitting: Internal Medicine

## 2024-04-18 VITALS — BP 150/100 | HR 79 | Ht 64.0 in | Wt 269.0 lb

## 2024-04-18 DIAGNOSIS — Z23 Encounter for immunization: Secondary | ICD-10-CM

## 2024-04-18 DIAGNOSIS — L03011 Cellulitis of right finger: Secondary | ICD-10-CM | POA: Diagnosis not present

## 2024-04-18 DIAGNOSIS — I10 Essential (primary) hypertension: Secondary | ICD-10-CM

## 2024-04-18 MED ORDER — HYDROCODONE-ACETAMINOPHEN 5-325 MG PO TABS: 1.0000 | ORAL_TABLET | Freq: Three times a day (TID) | ORAL | 0 refills | Status: AC | PRN

## 2024-04-18 MED ORDER — FLUCONAZOLE 150 MG PO TABS: 150.0000 mg | ORAL_TABLET | Freq: Once | ORAL | 0 refills | Status: AC

## 2024-04-18 MED ORDER — DOXYCYCLINE HYCLATE 100 MG PO TABS: ORAL_TABLET | ORAL | 0 refills | Status: AC

## 2024-04-18 NOTE — Patient Instructions (Addendum)
 Tetanus update given today. Please take Doxycycline  100 mg by mouth twice daily for 7 days. Soak Nail bed in warm soapy water for 20 minutes twice daily. May take Hydrocodone  APAP sparingly for pain Tetanus update given Do not apply any acrylic  nail tips until this infection has cleared and soreness improved which is likely to take 10 days to 2 weeks.Monitor blood pressure at home. It is elevated in office today with pain.

## 2024-04-18 NOTE — Progress Notes (Shared)
 "   Patient Care Team: Tysinger, Alm GORMAN RIGGERS as PCP - General (Family Medicine) Dann Candyce GORMAN, MD as PCP - Cardiology (Cardiology) [facility], [facility]  Visit Date: 04/18/24  Subjective:    Patient ID: Joanne Johns , Female   DOB: 09-08-1975, 49 y.o.    MRN: [id number]   49 y.o. Female presents today for hand pain. Patient has a past medical history of Hypertension, obesity, Knee pain, prediabetes.   Two weeks ago, she obtained acrylic nails. The tip of the long finger of her right hand has been throbbing intermittently for 2 weeks. Some  3 or 4 days ago, she removed  the acrylic nail on her right  long finger and found  purulent drainage in that area.  She also says that she has pain radiating up her right arm.   History of Hypertension treated with amlodipine  10 mg and valsartan  80 mg daily. Blood pressure is elevated at 150/100.   History of Vitamin D  deficiency not treated with medication.    History of Bilateral Knee pain previously treated with gel injections.   History of Obesity managed with Wegovy  2.4 mg weekly.   Vaccine counseling: Tetanus vaccine received today.    Past Medical History:  Diagnosis Date   Abnormal Pap smear 2005   paps normal 2007, 2008, 2009, 2010, 2013, 2014   Arthritis    Back pain    Chlamydia 2016   Herpes gingivostomatitis    History of EKG 02/09/2009   normal   History of pregnancy    5 pregnancies, 2 live births, 3 TABs   HSV infection 2018   positive HSV I and II   Hypertension    Impaired fasting blood sugar 2015   Liver hemangioma 2008   per ultrasound and 02/2013 MRI abdomen   Obesity    Respiratory tract infection 02/07/2016     Family History  Problem Relation Age of Onset   Tuberculosis Father    Diabetes Father    Hypertension Father    Heart attack Father    Cancer Maternal Uncle        leukemia   Anesthesia problems Neg Hx    Heart disease Neg Hx    Hyperlipidemia Neg Hx    Stroke Neg Hx     Colon polyps Neg Hx    Colon cancer Neg Hx    Esophageal cancer Neg Hx    Rectal cancer Neg Hx    Stomach cancer Neg Hx     Social History   Social History Narrative   Single, monogamous, 2 children, has sone and daughter, 1 granddaughter.  Nurse, Adult firm.  Sexually active.  12/2022      Review of Systems  Skin:        Middle finger pain        Objective:   Vitals: BP (!) 150/100   Pulse 79   Ht 5' 4 (1.626 m)   Wt 269 lb (122 kg)   SpO2 97%   BMI 46.17 kg/m    Physical Exam Musculoskeletal:     Right hand: Swelling and tenderness present.       Results:   Studies obtained and personally reviewed by me:    Labs:       Component Value Date/Time   NA 141 01/09/2024 1044   K 4.3 01/09/2024 1044   CL 105 01/09/2024 1044   CO2 22 01/09/2024 1044   GLUCOSE 81 01/09/2024 1044  GLUCOSE 93 11/26/2021 1121   BUN 13 01/09/2024 1044   CREATININE 1.15 (H) 01/09/2024 1044   CREATININE 1.13 (H) 03/24/2021 0928   CREATININE 1.13 (H) 03/24/2021 0928   CALCIUM 9.9 01/09/2024 1044   PROT 6.7 01/09/2024 1044   ALBUMIN 4.3 01/09/2024 1044   AST 11 01/09/2024 1044   ALT 10 01/09/2024 1044   ALKPHOS 84 01/09/2024 1044   BILITOT 0.3 01/09/2024 1044   GFRNONAA >60 11/26/2021 1121   GFRAA 79 03/03/2020 0927     Lab Results  Component Value Date   WBC 5.7 01/09/2024   HGB 14.2 01/09/2024   HCT 44.6 01/09/2024   MCV 95 01/09/2024   PLT 290 01/09/2024    Lab Results  Component Value Date   CHOL 217 (H) 01/09/2024   HDL 96 01/09/2024   LDLCALC 110 (H) 01/09/2024   TRIG 65 01/09/2024   CHOLHDL 2.3 01/09/2024    Lab Results  Component Value Date   HGBA1C 5.5 01/09/2024     Lab Results  Component Value Date   TSH 1.790 01/09/2024        Assessment & Plan:   Meds ordered this encounter  Medications   fluconazole  (DIFLUCAN ) 150 MG tablet    Sig: Take 1 tablet (150 mg total) by mouth once for 1 dose.    Dispense:   1 tablet    Refill:  0   HYDROcodone -acetaminophen  (NORCO/VICODIN) 5-325 MG tablet    Sig: Take 1 tablet by mouth every 8 (eight) hours as needed for moderate pain (pain score 4-6).    Dispense:  15 tablet    Refill:  0   doxycycline  (VIBRA -TABS) 100 MG tablet    Sig: One tablet twice daily  by  mouth for 7 days for nail bed infection    Dispense:  14 tablet    Refill:  0   Orders Placed This Encounter  Procedures   Tdap vaccine greater than or equal to 7yo IM   Cellulitis of right third finger: Two weeks ago she got acrylic nails and the tip of the middle finger of her right hand had been throbbing intermittently for 2 weeks. 3 or 4 days ago she had taken off the the nail on her middle finger and found that there was puss and she has been experiencing pain in her finger. She also said that she has pain radiating up her right arm.    Doxycyline 100 mg twice daily for seven days prescribed.   Diflucan  150 mg prescribed.   Norco 5-325 every 8 hours as needed for pain prescribed.    Advise to soak finger in warm soapy water for 20 minutes 2-3 times a day.   Hypertension treated with Amlodipine  10 mg and valsartan  80 mg daily. Blood pressure is elevated at 150/100. Patient is in pain and worried about her finger. Monitor BP at home.  Vitamin D  deficiency:  has been prescribed high dose Vitamin D  weekly  per Alm Gent, PA-C  Bilateral Knee pain: previously treated with gel injections.   Obesity: managed with Wegovy  2.4 mg weekly.   Vaccine counseling: Tetanus vaccine update  received today.   I,Makayla C Reid,acting as a scribe for Ronal JINNY Hailstone, MD.,have documented all relevant documentation on the behalf of Ronal JINNY Hailstone, MD,as directed by  Ronal JINNY Hailstone, MD while in the presence of Ronal JINNY Hailstone, MD.   I, Ronal JINNY Hailstone, MD, have reviewed all documentation for this visit. The documentation on 04/18/2024 for  the exam, diagnosis, procedures, and orders are all accurate and  complete.    "

## 2024-04-22 ENCOUNTER — Other Ambulatory Visit (HOSPITAL_COMMUNITY): Payer: Self-pay

## 2024-04-22 ENCOUNTER — Encounter: Payer: Self-pay | Admitting: Medical

## 2024-04-22 ENCOUNTER — Ambulatory Visit: Admitting: Medical

## 2024-04-22 VITALS — BP 138/88 | HR 80 | Temp 98.5°F | Ht 64.0 in | Wt 268.6 lb

## 2024-04-22 DIAGNOSIS — L03011 Cellulitis of right finger: Secondary | ICD-10-CM | POA: Diagnosis not present

## 2024-04-22 MED ORDER — AMOXICILLIN-POT CLAVULANATE 875-125 MG PO TABS: 1.0000 | ORAL_TABLET | Freq: Two times a day (BID) | ORAL | 0 refills | Status: AC

## 2024-04-22 MED ORDER — IBUPROFEN 800 MG PO TABS: 800.0000 mg | ORAL_TABLET | Freq: Two times a day (BID) | ORAL | 0 refills | Status: AC | PRN

## 2024-04-22 NOTE — Progress Notes (Signed)
 "  Name: Joanne Johns   Date of Visit: 04/22/24   Date of last visit with me: 04/15/2024   CHIEF COMPLAINT:  Chief Complaint  Patient presents with   Wound Infection    Right middle finger infection.        HPI:  Discussed the use of AI scribe software for clinical note transcription with the patient, who gave verbal consent to proceed.  History of Present Illness  Joanne Johns is a 49 year old female who presents with a right middle finger infection.  She initially experienced discomfort at the top of her right middle finger about two and a half weeks ago, which she attributed to arthritis. The pain intensified, leading her to remove the nail herself due to pressure, which worsened the condition. She now experiences shooting pains up her hand and significant tenderness.  She was seen by another physician and started on doxycycline  and pain medication, along with a yeast infection prophylactic. She has been on doxycycline  since Friday, taking her first dose at approximately 3:45 PM that day. She reports some improvement in symptoms but still experiences pain and tenderness.  She denies any recent symptoms of diabetes such as blurred vision or excessive thirst. Her last A1c was 5.5 in October. She has not been checking her blood sugars recently and does not have a glucometer at home.  She has been using ibuprofen  800 mg for pain relief, which she finds effective, particularly at night when the pain is most severe. She still has hydrocodone  from a previous prescription but finds it makes her sleepy.  No other aggravating or relieving factors. No other complaint.   Past Medical History:  Diagnosis Date   Abnormal Pap smear 2005   paps normal 2007, 2008, 2009, 2010, 2013, 2014   Arthritis    Back pain    Chlamydia 2016   Herpes gingivostomatitis    History of EKG 02/09/2009   normal   History of pregnancy    5 pregnancies, 2 live births, 3 TABs   HSV infection 2018    positive HSV I and II   Hypertension    Impaired fasting blood sugar 2015   Liver hemangioma 2008   per ultrasound and 02/2013 MRI abdomen   Obesity    Respiratory tract infection 02/07/2016   Current Outpatient Medications on File Prior to Visit  Medication Sig Dispense Refill   amLODipine  (NORVASC ) 10 MG tablet TAKE 1 TABLET(10 MG) BY MOUTH DAILY 90 tablet 3   doxycycline  (VIBRA -TABS) 100 MG tablet One tablet twice daily  by  mouth for 7 days for nail bed infection 14 tablet 0   HYDROcodone -acetaminophen  (NORCO/VICODIN) 5-325 MG tablet Take 1 tablet by mouth every 8 (eight) hours as needed for moderate pain (pain score 4-6). 15 tablet 0   levonorgestrel  (MIRENA ) 20 MCG/DAY IUD 1 each by Intrauterine route once.     valsartan  (DIOVAN ) 80 MG tablet Take 1 tablet (80 mg total) by mouth daily. 90 tablet 3   Vitamin D , Ergocalciferol , (DRISDOL ) 1.25 MG (50000 UNIT) CAPS capsule Take 1 capsule (50,000 Units total) by mouth every 7 (seven) days. 12 capsule 3   fluconazole  (DIFLUCAN ) 150 MG tablet Take 150 mg by mouth once. (Patient not taking: Reported on 04/22/2024)     tirzepatide  (ZEPBOUND ) 2.5 MG/0.5ML Pen Inject 2.5 mg into the skin once a week. (Patient not taking: Reported on 04/22/2024) 2 mL 0   No current facility-administered medications on file prior to visit.  ROS as in subjective    Objective: BP 138/88   Pulse 80   Temp 98.5 F (36.9 C) (Tympanic)   Ht 5' 4 (1.626 m)   Wt 268 lb 9.6 oz (121.8 kg)   BMI 46.11 kg/m   Gen: wd, wn, nad Skin: Right middle finger distal phalanx with swelling, general possible pus pocket on the posterior finger at the base of the nailbed but no obvious felon, no multicolor's or fluid pockets noted otherwise Fingers with normal strength and sensation normal blood flow and   Assessment: Encounter Diagnosis  Name Primary?   Paronychia of finger of right hand Yes     Plan:  Acute paronychia of right middle finger Acute paronychia with  significant pain and swelling. Initial doxycycline  ineffective. Possible pus pocket suspected. Discussed complications and need for orthopedic referral if worsens. - Switched to Augmentin . - attempted incision and drainage.  Cleaned and prepped finger in usual sterile fashion, use ethyl acetate 4 local anesthesia, used scalpel to incise but no pus was expressed only little bit of blood.  Cleaned wound and covered with a sterile bandage. - Prescribed ibuprofen  800 mg BID PRN, caution advised for kidney function. - Advised warm salt water soaks. - Instructed to monitor symptoms and seek orthopedic evaluation if worsens by Friday.   [name] was seen today for wound infection.  Diagnoses and all orders for this visit:  Paronychia of finger of right hand  Other orders -     amoxicillin -clavulanate (AUGMENTIN ) 875-125 MG tablet; Take 1 tablet by mouth 2 (two) times daily. -     ibuprofen  (ADVIL ) 800 MG tablet; Take 1 tablet (800 mg total) by mouth 2 (two) times daily as needed.    F/u prn   "

## 2024-04-23 ENCOUNTER — Other Ambulatory Visit (HOSPITAL_COMMUNITY): Payer: Self-pay

## 2024-04-23 NOTE — Progress Notes (Signed)
 Joanne Johns

## 2024-04-24 ENCOUNTER — Other Ambulatory Visit: Payer: Self-pay | Admitting: Medical

## 2024-04-24 ENCOUNTER — Other Ambulatory Visit (HOSPITAL_COMMUNITY): Payer: Self-pay

## 2024-04-24 MED ORDER — WEGOVY 0.25 MG/0.5ML ~~LOC~~ SOAJ: 0.2500 mg | SUBCUTANEOUS | 0 refills | Status: AC

## 2024-04-24 NOTE — Telephone Encounter (Signed)
 Pt was notified.   Please submit PA for wegovy 

## 2024-04-25 ENCOUNTER — Encounter: Payer: Self-pay | Admitting: Family

## 2024-04-25 ENCOUNTER — Telehealth: Payer: Self-pay

## 2024-04-25 ENCOUNTER — Ambulatory Visit: Admitting: Family

## 2024-04-25 ENCOUNTER — Other Ambulatory Visit (HOSPITAL_COMMUNITY): Payer: Self-pay

## 2024-04-25 DIAGNOSIS — L03011 Cellulitis of right finger: Secondary | ICD-10-CM

## 2024-04-25 NOTE — Progress Notes (Signed)
 "  Office Visit Note   Patient: Joanne Johns           Date of Birth: 10/08/1975           MRN: [id number] Visit Date: 04/25/2024              Requested by: Bulah Alm RAMAN, PA-C [address] [place],  [place] [address] PCP: Bulah Alm RAMAN, PA-C  Chief Complaint  Patient presents with   Right Hand - Pain      HPI: The patient is a 49 year old woman who presents today for concern of ongoing paronychia infection right long finger this began 1 week ago she initially presented to urgent care and was placed on a course of doxycycline .  She reports that she failed to improve in the coming days and presented to her primary care office this Monday she reports they attempted aspiration, no purulence expressed.  At that time they placed her on Augmentin .  She reports she has done once daily Epsom salt soaks  She did have 1 day where she reports having about 2 drops of purulent drainage  Continues to have significant swelling throbbing and pain to the long finger no fever no chills    Assessment & Plan: Visit Diagnoses: No diagnosis found.  Plan: Discussed case with Dr. Erwin who recommended 3 times a day Epsom salt or betadine soaks.  Recommended warm compresses she will continue on her Augmentin  and follow-up with him on Monday  Discussed return precautions she is aware of ability to call the on-call doctor over the weekend.  Follow-Up Instructions: No follow-ups on file.   Ortho Exam  Patient is alert, oriented, no adenopathy, well-dressed, normal affect, normal respiratory effort.  Please see attached image.  On examination right hand the long finger with signifcant edema fluctuance at base of nail plate and along medial and lateral border she also has fluctuance to the distal tip, no fluctuance to pad or discoloration to pad.  No cellulitis   While in office did have spontaneous purulent drainage from the nail, where plate was disrupted    Imaging: No results  found.   Labs: Lab Results  Component Value Date   HGBA1C 5.5 01/09/2024   HGBA1C 5.7 (H) 01/09/2023   HGBA1C 5.8 (H) 03/24/2021   LABORGA METHICILLIN RESISTANT STAPHYLOCOCCUS AUREUS 02/04/2013     Lab Results  Component Value Date   ALBUMIN 4.3 01/09/2024   ALBUMIN 4.1 01/09/2023   ALBUMIN 4.3 11/26/2021    No results found for: MG Lab Results  Component Value Date   VD25OH 23.8 (L) 01/09/2024   VD25OH 17.6 (L) 01/09/2023    No results found for: PREALBUMIN    Latest Ref Rng & Units 01/09/2024   10:44 AM 12/28/2022    8:51 AM 11/26/2021   11:21 AM  CBC EXTENDED  WBC 3.4 - 10.8 x10E3/uL 5.7  7.1  7.4   RBC 3.77 - 5.28 x10E6/uL 4.71  4.59  4.84   Hemoglobin 11.1 - 15.9 g/dL 85.7  86.1  85.4   HCT 34.0 - 46.6 % 44.6  42.5  43.9   Platelets 150 - 450 x10E3/uL 290  355  300   NEUT# 1.7 - 7.7 K/uL   4.9   Lymph# 0.7 - 4.0 K/uL   2.0      There is no height or weight on file to calculate BMI.  Orders:  No orders of the defined types were placed in this encounter.  No orders of  the defined types were placed in this encounter.    Procedures: No procedures performed  Clinical Data: No additional findings.  ROS:  All other systems negative, except as noted in the HPI. Review of Systems  Objective: Vital Signs: There were no vitals taken for this visit.  Specialty Comments:  No specialty comments available.  PMFS History: Patient Active Problem List   Diagnosis Date Noted   Influenza 03/03/2022   Screening for heart disease 04/01/2021   Routine general medical examination at a health care facility 02/07/2016   Essential hypertension 02/07/2016   IUD (intrauterine device) in place 02/07/2016   Need for prophylactic vaccination and inoculation against influenza 12/25/2014   Encounter for health maintenance examination in adult 12/25/2014   Sleep disturbance 12/25/2014   Pelvic pain in female 12/25/2014   Encounter for surveillance of contraceptive  pills 02/05/2014   Impaired fasting blood sugar 02/05/2014   Past Medical History:  Diagnosis Date   Abnormal Pap smear 2005   paps normal 2007, 2008, 2009, 2010, 2013, 2014   Arthritis    Back pain    Chlamydia 2016   Herpes gingivostomatitis    History of EKG 02/09/2009   normal   History of pregnancy    5 pregnancies, 2 live births, 3 TABs   HSV infection 2018   positive HSV I and II   Hypertension    Impaired fasting blood sugar 2015   Liver hemangioma 2008   per ultrasound and 02/2013 MRI abdomen   Obesity    Respiratory tract infection 02/07/2016    Family History  Problem Relation Age of Onset   Tuberculosis Father    Diabetes Father    Hypertension Father    Heart attack Father    Cancer Maternal Uncle        leukemia   Anesthesia problems Neg Hx    Heart disease Neg Hx    Hyperlipidemia Neg Hx    Stroke Neg Hx    Colon polyps Neg Hx    Colon cancer Neg Hx    Esophageal cancer Neg Hx    Rectal cancer Neg Hx    Stomach cancer Neg Hx     Past Surgical History:  Procedure Laterality Date   COLPOSCOPY  05/2003   INTRAUTERINE DEVICE (IUD) INSERTION  2017   Mirena    KNEE SURGERY  2000   R knee, ACL, MCL meniscus   Social History   Occupational History   Occupation: advertising account executive    Comment: Orthoptist  Tobacco Use   Smoking status: Never    Passive exposure: Never   Smokeless tobacco: Never  Vaping Use   Vaping status: Never Used  Substance and Sexual Activity   Alcohol use: Yes    Comment: occ   Drug use: No   Sexual activity: Not Currently    Partners: Male    Birth control/protection: I.U.D.    Comment: menarche 49yo, sexual debut 49yo        "

## 2024-04-25 NOTE — Telephone Encounter (Signed)
 Pharmacy Patient Advocate Encounter   Received notification from Pt Calls Messages that prior authorization for Wegovy  0.25mg /0.71ml is required/requested.   Insurance verification completed.   The patient is insured through [employer].   Per test claim: PA required; PA submitted to above mentioned insurance via Latent Key/confirmation #/EOC [id number] Status is pending

## 2024-04-29 ENCOUNTER — Ambulatory Visit: Admitting: Orthopedic Surgery

## 2024-04-30 ENCOUNTER — Ambulatory Visit: Admitting: Orthopedic Surgery
